# Patient Record
Sex: Female | Born: 1935
Health system: Southern US, Community
[De-identification: ages and names within clinical notes are randomized; demographics above are authoritative.]

## PROBLEM LIST (undated history)

## (undated) DIAGNOSIS — Z860100 Personal history of colon polyps, unspecified: Secondary | ICD-10-CM

## (undated) DIAGNOSIS — Z90710 Acquired absence of both cervix and uterus: Secondary | ICD-10-CM

## (undated) DIAGNOSIS — I4891 Unspecified atrial fibrillation: Secondary | ICD-10-CM

## (undated) DIAGNOSIS — Z8673 Personal history of transient ischemic attack (TIA), and cerebral infarction without residual deficits: Secondary | ICD-10-CM

## (undated) DIAGNOSIS — E785 Hyperlipidemia, unspecified: Secondary | ICD-10-CM

## (undated) DIAGNOSIS — I639 Cerebral infarction, unspecified: Secondary | ICD-10-CM

## (undated) DIAGNOSIS — I471 Supraventricular tachycardia: Secondary | ICD-10-CM

## (undated) DIAGNOSIS — M47812 Spondylosis without myelopathy or radiculopathy, cervical region: Secondary | ICD-10-CM

## (undated) DIAGNOSIS — M1610 Unilateral primary osteoarthritis, unspecified hip: Secondary | ICD-10-CM

## (undated) DIAGNOSIS — I10 Essential (primary) hypertension: Secondary | ICD-10-CM

## (undated) HISTORY — DX: Unspecified atrial fibrillation: I48.91

## (undated) HISTORY — DX: Personal history of colonic polyps: Z86.010

## (undated) HISTORY — DX: Personal history of colon polyps, unspecified: Z86.0100

## (undated) HISTORY — DX: Unilateral primary osteoarthritis, unspecified hip: M16.10

## (undated) HISTORY — PX: ABDOMINAL HYSTERECTOMY: SHX81

## (undated) HISTORY — DX: Essential (primary) hypertension: I10

## (undated) HISTORY — PX: OTHER SURGICAL HISTORY: SHX169

## (undated) HISTORY — DX: Personal history of transient ischemic attack (TIA), and cerebral infarction without residual deficits: Z86.73

## (undated) HISTORY — DX: Acquired absence of both cervix and uterus: Z90.710

## (undated) HISTORY — PX: BREAST LUMPECTOMY: SHX2

## (undated) HISTORY — PX: PILONIDAL CYST EXCISION: SHX744

## (undated) HISTORY — DX: Hyperlipidemia, unspecified: E78.5

## (undated) HISTORY — PX: INTRACAPSULAR CATARACT EXTRACTION: SHX361

## (undated) HISTORY — DX: Supraventricular tachycardia: I47.1

## (undated) HISTORY — DX: Spondylosis without myelopathy or radiculopathy, cervical region: M47.812

## (undated) SURGERY — Surgical Case
Anesthesia: *Unknown

---

## 1998-11-18 ENCOUNTER — Ambulatory Visit (HOSPITAL_COMMUNITY): Admission: RE | Admit: 1998-11-18 | Discharge: 1998-11-18 | Payer: Self-pay | Admitting: Family Medicine

## 2000-03-05 ENCOUNTER — Other Ambulatory Visit: Admission: RE | Admit: 2000-03-05 | Discharge: 2000-03-05 | Payer: Self-pay | Admitting: Family Medicine

## 2002-06-18 ENCOUNTER — Other Ambulatory Visit: Admission: RE | Admit: 2002-06-18 | Discharge: 2002-06-18 | Payer: Self-pay | Admitting: Radiology

## 2002-11-21 ENCOUNTER — Encounter: Payer: Self-pay | Admitting: Surgery

## 2002-11-24 ENCOUNTER — Encounter (INDEPENDENT_AMBULATORY_CARE_PROVIDER_SITE_OTHER): Payer: Self-pay

## 2002-11-24 ENCOUNTER — Ambulatory Visit (HOSPITAL_COMMUNITY): Admission: RE | Admit: 2002-11-24 | Discharge: 2002-11-24 | Payer: Self-pay | Admitting: Surgery

## 2004-10-26 ENCOUNTER — Ambulatory Visit: Payer: Self-pay | Admitting: Internal Medicine

## 2004-11-17 ENCOUNTER — Ambulatory Visit: Payer: Self-pay | Admitting: Internal Medicine

## 2004-11-24 ENCOUNTER — Ambulatory Visit: Payer: Self-pay | Admitting: Internal Medicine

## 2005-11-15 ENCOUNTER — Ambulatory Visit: Payer: Self-pay | Admitting: Internal Medicine

## 2005-11-20 ENCOUNTER — Ambulatory Visit: Payer: Self-pay | Admitting: Internal Medicine

## 2005-12-04 HISTORY — PX: TRANSTHORACIC ECHOCARDIOGRAM: SHX275

## 2006-09-26 ENCOUNTER — Ambulatory Visit: Payer: Self-pay | Admitting: Internal Medicine

## 2007-09-02 ENCOUNTER — Emergency Department (HOSPITAL_COMMUNITY): Admission: EM | Admit: 2007-09-02 | Discharge: 2007-09-03 | Payer: Self-pay | Admitting: Emergency Medicine

## 2007-09-24 ENCOUNTER — Encounter: Payer: Self-pay | Admitting: Internal Medicine

## 2007-09-24 ENCOUNTER — Ambulatory Visit: Payer: Self-pay | Admitting: Internal Medicine

## 2007-09-24 DIAGNOSIS — H53129 Transient visual loss, unspecified eye: Secondary | ICD-10-CM | POA: Insufficient documentation

## 2007-09-24 DIAGNOSIS — Z860100 Personal history of colon polyps, unspecified: Secondary | ICD-10-CM

## 2007-09-24 DIAGNOSIS — I10 Essential (primary) hypertension: Secondary | ICD-10-CM | POA: Insufficient documentation

## 2007-09-24 HISTORY — DX: Personal history of colonic polyps: Z86.010

## 2007-09-24 HISTORY — DX: Personal history of colon polyps, unspecified: Z86.0100

## 2007-09-25 ENCOUNTER — Ambulatory Visit: Payer: Self-pay

## 2007-09-25 ENCOUNTER — Encounter: Payer: Self-pay | Admitting: Internal Medicine

## 2007-10-10 ENCOUNTER — Telehealth: Payer: Self-pay | Admitting: Internal Medicine

## 2007-10-25 ENCOUNTER — Ambulatory Visit: Payer: Self-pay | Admitting: Internal Medicine

## 2007-10-25 ENCOUNTER — Telehealth: Payer: Self-pay | Admitting: Internal Medicine

## 2007-11-03 ENCOUNTER — Encounter: Payer: Self-pay | Admitting: Internal Medicine

## 2007-11-11 ENCOUNTER — Encounter: Admission: RE | Admit: 2007-11-11 | Discharge: 2007-11-11 | Payer: Self-pay | Admitting: Neurology

## 2007-11-11 ENCOUNTER — Encounter: Payer: Self-pay | Admitting: Internal Medicine

## 2007-11-13 ENCOUNTER — Telehealth: Payer: Self-pay | Admitting: Internal Medicine

## 2007-11-13 ENCOUNTER — Ambulatory Visit: Payer: Self-pay | Admitting: Internal Medicine

## 2007-11-13 ENCOUNTER — Encounter (INDEPENDENT_AMBULATORY_CARE_PROVIDER_SITE_OTHER): Payer: Self-pay | Admitting: *Deleted

## 2007-11-25 ENCOUNTER — Telehealth: Payer: Self-pay | Admitting: Internal Medicine

## 2007-12-17 ENCOUNTER — Ambulatory Visit: Payer: Self-pay | Admitting: Internal Medicine

## 2007-12-20 ENCOUNTER — Encounter: Payer: Self-pay | Admitting: Internal Medicine

## 2007-12-20 ENCOUNTER — Ambulatory Visit: Payer: Self-pay

## 2008-02-10 ENCOUNTER — Ambulatory Visit: Payer: Self-pay | Admitting: Internal Medicine

## 2008-03-30 ENCOUNTER — Encounter: Payer: Self-pay | Admitting: Internal Medicine

## 2008-03-30 LAB — HM COLONOSCOPY

## 2008-09-16 ENCOUNTER — Ambulatory Visit: Payer: Self-pay | Admitting: Internal Medicine

## 2008-09-25 ENCOUNTER — Ambulatory Visit: Payer: Self-pay | Admitting: Internal Medicine

## 2008-10-05 ENCOUNTER — Encounter: Payer: Self-pay | Admitting: Internal Medicine

## 2008-10-09 ENCOUNTER — Ambulatory Visit: Payer: Self-pay | Admitting: Internal Medicine

## 2008-10-09 DIAGNOSIS — E785 Hyperlipidemia, unspecified: Secondary | ICD-10-CM | POA: Insufficient documentation

## 2008-10-09 LAB — CONVERTED CEMR LAB
ALT: 24 units/L (ref 0–35)
AST: 25 units/L (ref 0–37)
Albumin: 4.2 g/dL (ref 3.5–5.2)
Alkaline Phosphatase: 45 units/L (ref 39–117)
BUN: 20 mg/dL (ref 6–23)
Basophils Absolute: 0 10*3/uL (ref 0.0–0.1)
Basophils Relative: 0.6 % (ref 0.0–3.0)
Bilirubin, Direct: 0.1 mg/dL (ref 0.0–0.3)
CO2: 28 meq/L (ref 19–32)
Calcium: 10.2 mg/dL (ref 8.4–10.5)
Chloride: 106 meq/L (ref 96–112)
Cholesterol: 149 mg/dL (ref 0–200)
Creatinine, Ser: 1.1 mg/dL (ref 0.4–1.2)
Eosinophils Absolute: 0.1 10*3/uL (ref 0.0–0.7)
Eosinophils Relative: 2.1 % (ref 0.0–5.0)
GFR calc Af Amer: 62 mL/min
GFR calc non Af Amer: 51 mL/min
Glucose, Bld: 102 mg/dL — ABNORMAL HIGH (ref 70–99)
HCT: 37.3 % (ref 36.0–46.0)
HDL: 85 mg/dL (ref >39.0)
Hemoglobin: 12.9 g/dL (ref 12.0–15.0)
LDL Cholesterol: 52 mg/dL (ref 0–99)
Lymphocytes Relative: 27.6 % (ref 12.0–46.0)
MCHC: 34.5 g/dL (ref 30.0–36.0)
MCV: 88.4 fL (ref 78.0–100.0)
Monocytes Absolute: 0.5 10*3/uL (ref 0.1–1.0)
Monocytes Relative: 9 % (ref 3.0–12.0)
Neutro Abs: 3.2 10*3/uL (ref 1.4–7.7)
Neutrophils Relative %: 60.7 % (ref 43.0–77.0)
Platelets: 174 10*3/uL (ref 150–400)
Potassium: 4 meq/L (ref 3.5–5.1)
RBC: 4.22 M/uL (ref 3.87–5.11)
RDW: 13.9 % (ref 11.5–14.6)
Sodium: 139 meq/L (ref 135–145)
TSH: 0.93 microintl units/mL (ref 0.35–5.50)
Total Bilirubin: 0.7 mg/dL (ref 0.3–1.2)
Total CHOL/HDL Ratio: 1.8
Total Protein: 6.8 g/dL (ref 6.0–8.3)
Triglycerides: 60 mg/dL (ref 0–149)
VLDL: 12 mg/dL (ref 0–40)
WBC: 5.3 10*3/uL (ref 4.5–10.5)

## 2008-10-11 ENCOUNTER — Encounter: Payer: Self-pay | Admitting: Internal Medicine

## 2009-01-05 ENCOUNTER — Telehealth: Payer: Self-pay | Admitting: Internal Medicine

## 2009-02-05 ENCOUNTER — Telehealth: Payer: Self-pay | Admitting: Internal Medicine

## 2009-02-11 ENCOUNTER — Ambulatory Visit: Payer: Self-pay | Admitting: Internal Medicine

## 2009-02-18 ENCOUNTER — Encounter: Payer: Self-pay | Admitting: Internal Medicine

## 2009-02-18 ENCOUNTER — Ambulatory Visit: Payer: Self-pay | Admitting: Internal Medicine

## 2009-09-27 ENCOUNTER — Ambulatory Visit: Payer: Self-pay | Admitting: Internal Medicine

## 2009-10-13 ENCOUNTER — Encounter: Payer: Self-pay | Admitting: Internal Medicine

## 2009-10-13 LAB — HM MAMMOGRAPHY: HM Mammogram: NORMAL

## 2009-11-09 ENCOUNTER — Ambulatory Visit: Payer: Self-pay | Admitting: Internal Medicine

## 2009-11-09 LAB — CONVERTED CEMR LAB
ALT: 22 units/L (ref 0–35)
AST: 26 units/L (ref 0–37)
Albumin: 4.4 g/dL (ref 3.5–5.2)
Alkaline Phosphatase: 43 units/L (ref 39–117)
BUN: 23 mg/dL (ref 6–23)
Basophils Absolute: 0.1 10*3/uL (ref 0.0–0.1)
Basophils Relative: 0.9 % (ref 0.0–3.0)
Bilirubin, Direct: 0.1 mg/dL (ref 0.0–0.3)
CO2: 28 meq/L (ref 19–32)
Calcium: 10.4 mg/dL (ref 8.4–10.5)
Chloride: 104 meq/L (ref 96–112)
Cholesterol: 172 mg/dL (ref 0–200)
Creatinine, Ser: 1.1 mg/dL (ref 0.4–1.2)
Eosinophils Absolute: 0.1 10*3/uL (ref 0.0–0.7)
Eosinophils Relative: 1.3 % (ref 0.0–5.0)
GFR calc non Af Amer: 50.85 mL/min (ref >60)
Glucose, Bld: 92 mg/dL (ref 70–99)
HCT: 39.7 % (ref 36.0–46.0)
HDL: 93.7 mg/dL (ref >39.00)
Hemoglobin: 13.3 g/dL (ref 12.0–15.0)
LDL Cholesterol: 67 mg/dL (ref 0–99)
Lymphocytes Relative: 24.8 % (ref 12.0–46.0)
Lymphs Abs: 1.5 10*3/uL (ref 0.7–4.0)
MCHC: 33.4 g/dL (ref 30.0–36.0)
MCV: 89.5 fL (ref 78.0–100.0)
Monocytes Absolute: 0.5 10*3/uL (ref 0.1–1.0)
Monocytes Relative: 7.4 % (ref 3.0–12.0)
Neutro Abs: 4 10*3/uL (ref 1.4–7.7)
Neutrophils Relative %: 65.6 % (ref 43.0–77.0)
Platelets: 178 10*3/uL (ref 150.0–400.0)
Potassium: 4.2 meq/L (ref 3.5–5.1)
RBC: 4.44 M/uL (ref 3.87–5.11)
RDW: 13.8 % (ref 11.5–14.6)
Sodium: 140 meq/L (ref 135–145)
TSH: 0.97 microintl units/mL (ref 0.35–5.50)
Total Bilirubin: 0.7 mg/dL (ref 0.3–1.2)
Total CHOL/HDL Ratio: 2
Total Protein: 7.4 g/dL (ref 6.0–8.3)
Triglycerides: 58 mg/dL (ref 0.0–149.0)
VLDL: 11.6 mg/dL (ref 0.0–40.0)
WBC: 6.2 10*3/uL (ref 4.5–10.5)

## 2009-11-14 ENCOUNTER — Encounter: Payer: Self-pay | Admitting: Internal Medicine

## 2010-02-01 DIAGNOSIS — I639 Cerebral infarction, unspecified: Secondary | ICD-10-CM

## 2010-02-01 HISTORY — DX: Cerebral infarction, unspecified: I63.9

## 2010-02-09 ENCOUNTER — Ambulatory Visit: Payer: Self-pay | Admitting: Internal Medicine

## 2010-02-09 DIAGNOSIS — R002 Palpitations: Secondary | ICD-10-CM | POA: Insufficient documentation

## 2010-10-18 ENCOUNTER — Ambulatory Visit: Payer: Self-pay | Admitting: Internal Medicine

## 2010-11-22 ENCOUNTER — Ambulatory Visit: Payer: Self-pay | Admitting: Internal Medicine

## 2010-11-22 LAB — CONVERTED CEMR LAB
ALT: 24 units/L (ref 0–35)
AST: 24 units/L (ref 0–37)
Albumin: 4.2 g/dL (ref 3.5–5.2)
Alkaline Phosphatase: 45 units/L (ref 39–117)
BUN: 27 mg/dL — ABNORMAL HIGH (ref 6–23)
Basophils Absolute: 0 10*3/uL (ref 0.0–0.1)
Basophils Relative: 0.7 % (ref 0.0–3.0)
Bilirubin, Direct: 0.1 mg/dL (ref 0.0–0.3)
CO2: 27 meq/L (ref 19–32)
Calcium: 10.9 mg/dL — ABNORMAL HIGH (ref 8.4–10.5)
Chloride: 101 meq/L (ref 96–112)
Cholesterol: 183 mg/dL (ref 0–200)
Creatinine, Ser: 1.2 mg/dL (ref 0.4–1.2)
Eosinophils Absolute: 0.1 10*3/uL (ref 0.0–0.7)
Eosinophils Relative: 1.4 % (ref 0.0–5.0)
GFR calc non Af Amer: 45.01 mL/min — ABNORMAL LOW (ref >60.00)
Glucose, Bld: 74 mg/dL (ref 70–99)
HCT: 41.4 % (ref 36.0–46.0)
HDL: 97 mg/dL (ref >39.00)
Hemoglobin: 14.1 g/dL (ref 12.0–15.0)
LDL Cholesterol: 70 mg/dL (ref 0–99)
Lymphocytes Relative: 34.6 % (ref 12.0–46.0)
Lymphs Abs: 2.5 10*3/uL (ref 0.7–4.0)
MCHC: 34 g/dL (ref 30.0–36.0)
MCV: 88.4 fL (ref 78.0–100.0)
Monocytes Absolute: 0.7 10*3/uL (ref 0.1–1.0)
Monocytes Relative: 9.4 % (ref 3.0–12.0)
Neutro Abs: 3.8 10*3/uL (ref 1.4–7.7)
Neutrophils Relative %: 53.9 % (ref 43.0–77.0)
Phosphorus: 3.2 mg/dL (ref 2.3–4.6)
Platelets: 233 10*3/uL (ref 150.0–400.0)
Potassium: 4.1 meq/L (ref 3.5–5.1)
RBC: 4.69 M/uL (ref 3.87–5.11)
RDW: 14.9 % — ABNORMAL HIGH (ref 11.5–14.6)
Sodium: 136 meq/L (ref 135–145)
TSH: 1.19 microintl units/mL (ref 0.35–5.50)
Total Bilirubin: 0.7 mg/dL (ref 0.3–1.2)
Total CHOL/HDL Ratio: 2
Total Protein: 7.2 g/dL (ref 6.0–8.3)
Triglycerides: 78 mg/dL (ref 0.0–149.0)
VLDL: 15.6 mg/dL (ref 0.0–40.0)
WBC: 7.1 10*3/uL (ref 4.5–10.5)

## 2010-12-14 ENCOUNTER — Telehealth: Payer: Self-pay | Admitting: Internal Medicine

## 2010-12-15 ENCOUNTER — Encounter: Payer: Self-pay | Admitting: Internal Medicine

## 2010-12-15 ENCOUNTER — Emergency Department (HOSPITAL_COMMUNITY)
Admission: EM | Admit: 2010-12-15 | Discharge: 2010-12-15 | Payer: Self-pay | Source: Home / Self Care | Admitting: Emergency Medicine

## 2010-12-19 LAB — BASIC METABOLIC PANEL
BUN: 20 mg/dL (ref 6–23)
CO2: 24 mEq/L (ref 19–32)
Calcium: 10.4 mg/dL (ref 8.4–10.5)
Chloride: 106 mEq/L (ref 96–112)
Creatinine, Ser: 1.24 mg/dL — ABNORMAL HIGH (ref 0.4–1.2)
GFR calc Af Amer: 50 mL/min — ABNORMAL LOW (ref >60)
GFR calc non Af Amer: 42 mL/min — ABNORMAL LOW (ref >60)
Glucose, Bld: 111 mg/dL — ABNORMAL HIGH (ref 70–99)
Potassium: 3.9 mEq/L (ref 3.5–5.1)
Sodium: 139 mEq/L (ref 135–145)

## 2010-12-19 LAB — POCT CARDIAC MARKERS
CKMB, poc: 1.2 ng/mL (ref 1.0–8.0)
Myoglobin, poc: 84.6 ng/mL (ref 12–200)
Troponin i, poc: <0.05 ng/mL (ref 0.00–0.09)

## 2010-12-19 LAB — URINALYSIS, ROUTINE W REFLEX MICROSCOPIC
Bilirubin Urine: NEGATIVE
Hgb urine dipstick: NEGATIVE
Ketones, ur: NEGATIVE mg/dL
Nitrite: NEGATIVE
Protein, ur: NEGATIVE mg/dL
Specific Gravity, Urine: 1.009 (ref 1.005–1.030)
Urine Glucose, Fasting: NEGATIVE mg/dL
Urobilinogen, UA: 0.2 mg/dL (ref 0.0–1.0)
pH: 6 (ref 5.0–8.0)

## 2010-12-19 LAB — CBC
HCT: 39.9 % (ref 36.0–46.0)
Hemoglobin: 13.8 g/dL (ref 12.0–15.0)
MCH: 29.6 pg (ref 26.0–34.0)
MCHC: 34.6 g/dL (ref 30.0–36.0)
MCV: 85.6 fL (ref 78.0–100.0)
Platelets: 188 10*3/uL (ref 150–400)
RBC: 4.66 MIL/uL (ref 3.87–5.11)
RDW: 14.4 % (ref 11.5–15.5)
WBC: 7.4 10*3/uL (ref 4.0–10.5)

## 2010-12-19 LAB — DIFFERENTIAL
Basophils Absolute: 0 10*3/uL (ref 0.0–0.1)
Basophils Relative: 1 % (ref 0–1)
Eosinophils Absolute: 0.1 10*3/uL (ref 0.0–0.7)
Eosinophils Relative: 1 % (ref 0–5)
Lymphocytes Relative: 32 % (ref 12–46)
Lymphs Abs: 2.4 10*3/uL (ref 0.7–4.0)
Monocytes Absolute: 0.6 10*3/uL (ref 0.1–1.0)
Monocytes Relative: 8 % (ref 3–12)
Neutro Abs: 4.3 10*3/uL (ref 1.7–7.7)
Neutrophils Relative %: 59 % (ref 43–77)

## 2010-12-20 ENCOUNTER — Ambulatory Visit
Admission: RE | Admit: 2010-12-20 | Discharge: 2010-12-20 | Payer: Self-pay | Source: Home / Self Care | Attending: Internal Medicine | Admitting: Internal Medicine

## 2010-12-20 ENCOUNTER — Encounter: Payer: Self-pay | Admitting: Internal Medicine

## 2010-12-20 DIAGNOSIS — I471 Supraventricular tachycardia, unspecified: Secondary | ICD-10-CM | POA: Insufficient documentation

## 2010-12-20 HISTORY — DX: Supraventricular tachycardia: I47.1

## 2010-12-20 HISTORY — DX: Supraventricular tachycardia, unspecified: I47.10

## 2011-01-04 ENCOUNTER — Encounter: Payer: Self-pay | Admitting: Internal Medicine

## 2011-01-04 ENCOUNTER — Ambulatory Visit: Admit: 2011-01-04 | Payer: Self-pay

## 2011-01-04 ENCOUNTER — Encounter (INDEPENDENT_AMBULATORY_CARE_PROVIDER_SITE_OTHER): Payer: MEDICARE | Admitting: Internal Medicine

## 2011-01-04 ENCOUNTER — Ambulatory Visit: Admit: 2011-01-04 | Payer: Self-pay | Admitting: Internal Medicine

## 2011-01-04 ENCOUNTER — Encounter (INDEPENDENT_AMBULATORY_CARE_PROVIDER_SITE_OTHER): Payer: MEDICARE

## 2011-01-04 DIAGNOSIS — I471 Supraventricular tachycardia: Secondary | ICD-10-CM

## 2011-01-04 DIAGNOSIS — R0989 Other specified symptoms and signs involving the circulatory and respiratory systems: Secondary | ICD-10-CM

## 2011-01-05 NOTE — Consult Note (Signed)
Summary: [facility]  [facility]   Imported By: Esmeralda Links D'jimraou 04/03/2008 14:55:28  _____________________________________________________________________  External Attachment:    Type:   Image     Comment:   External Document

## 2011-01-05 NOTE — Assessment & Plan Note (Signed)
Summary: YEARLY--STC   Vital Signs:  Patient profile:   77 year old female Height:      62 inches Weight:      150 pounds BMI:     27.53 O2 Sat:      98 % on Room air Temp:     97.8 degrees F oral Pulse rate:   61 / minute BP sitting:   164 / 70  (left arm) Cuff size:   regular  Vitals Entered By: Bill Salinas CMA (November 22, 2010 10:12 AM)  O2 Flow:  Room air CC: yearly  Vision Screening:      Vision Comments: cateract surg on both eyes in the past year   Primary Care :  [facility]  CC:  yearly.  History of Present Illness: Patient presents for medicare wellness exam. She is feeling OK . She does have pain in the jaw intermittently, once a month, that is not exercise related. No associated symptoms.   She has not had any synocpal episodes. She has been released by Dr. Ladona Ridgel.   100% independent in her ADLs. No evidence of depression. She did have a fall on an icy deck. She is not fall prone and has no increased risks. She manges all her own finances: pays the bills, balances her checkbook, remains active and is cognitively intact.   Preventive Screening-Counseling & Management  Alcohol-Tobacco     Alcohol drinks/day: <1     Alcohol type: wine     Smoking Status: never  Caffeine-Diet-Exercise     Caffeine use/day: 2 cups per day     Diet Comments: heart healthy, low carb     Diet Counseling: not indicated; diet is assessed to be healthy     Does Patient Exercise: no     Exercise Counseling: to improve exercise regimen  Hep-HIV-STD-Contraception     Hepatitis Risk: no risk noted     HIV Risk: no risk noted     STD Risk: no risk noted     Dental Visit-last 6 months yes     SBE monthly: yes     Sun Exposure-Excessive: no  Safety-Violence-Falls     Seat Belt Use: yes     Helmet Use: n/a     Firearms in the Home: no firearms in the home     Smoke Detectors: yes     Violence in the Home: no risk noted     Sexual Abuse: no     Fall Risk: slight fall  risk      Drug Use:  never.        Blood Transfusions:  no.    Current Medications (verified): 1)  Diovan Hct 160-12.5 Mg  Tabs (Valsartan-Hydrochlorothiazide) .... Once Daily 2)  Aspirin 325 Mg  Tabs (Aspirin) .... Take 1 Tablet By Mouth Two Times A Day 3)  Nitroglycerin 0.4 Mg Subl (Nitroglycerin) .... Take 1 Tab Q 5 Min Sl As Needed Chest Pain 4)  Simvastatin 10 Mg Tabs (Simvastatin) .... Take 1 Tab By Mouth At Bedtime 5)  Verapamil Hcl Cr 180 Mg Cr-Tabs (Verapamil Hcl) .... Take 1 Tablet By Mouth Once A Day 6)  Tums 500 Mg Chew (Calcium Carbonate Antacid) .... As Directed 7)  Folbic 2.5-25-2 Mg Tabs (Fa-Pyridoxine-Cyancobalamin) .... One By Mouth Daily 8)  Fish Oil 1000 Mg Caps (Omega-3 Fatty Acids) .Marland Kitchen.. 1 Three Times A Day  Allergies (verified): 1)  ! Codeine  Past History:  Past Medical History: Last updated: 02/17/2009 UCD arthritis-cervical and hips G5P6-  1 twinning Colonic polyps, hx of Hypertension supraventricular tachycardia  Past Surgical History: pilonidal cyst excision Hysterectomy-fibroid tumors Lumpectomy-left FB removal from popliteal fossae Cateract surg on Left eye Oct 18, 2009  right eye Jan '11 By Dr Doloris Hall  Family History: mother- deceased @81  - CVA, CAD/w MI,  father- colon cancer. Died at 43 no breast cancer, DM-in daughter  Social History: Reviewed history from 11/09/2009 and no changes required. married - 1951- widowed 04/29/2023 4-daughters, 1 died leukemia 2 sons 10 grandchildren 5 great-grandchildren I-ADLs Retired  Tobacco Use - No.  Alcohol Use - no Caffeine use/day:  2 cups per day Dental Care w/in 6 mos.:  yes Sun Exposure-Excessive:  no Seat Belt Use:  yes Fall Risk:  slight fall risk Blood Transfusions:  no Hepatitis Risk:  no risk noted HIV Risk:  no risk noted STD Risk:  no risk noted Drug Use:  never  Review of Systems       The patient complains of peripheral edema.  The patient denies anorexia, fever, weight  loss, weight gain, vision loss, decreased hearing, hoarseness, chest pain, syncope, dyspnea on exertion, prolonged cough, abdominal pain, severe indigestion/heartburn, incontinence, muscle weakness, difficulty walking, depression, abnormal bleeding, enlarged lymph nodes, angioedema, and breast masses.    Physical Exam  General:  Well-developed,well-nourished,in no acute distress; alert,appropriate and cooperative throughout examination Head:  Normocephalic and atraumatic without obvious abnormalities. No apparent alopecia or balding. Eyes:  No corneal or conjunctival inflammation noted. EOMI. Perrla. Funduscopic exam benign, without hemorrhages, exudates or papilledema. Vision grossly normal. Ears:  External ear exam shows no significant lesions or deformities.  Otoscopic examination reveals clear canals, tympanic membranes are intact bilaterally without bulging, retraction, inflammation or discharge. Hearing is grossly normal bilaterally. Nose:  no external deformity and no external erythema.   Mouth:  Oral mucosa and oropharynx without lesions or exudates.  Teeth in good repair. Neck:  supple, full ROM, no thyromegaly, and no carotid bruits.   Chest Wall:  No deformities, masses, or tenderness noted. Breasts:  No mass, nodules, thickening, tenderness, bulging, retraction, inflamation, nipple discharge or skin changes noted.   Lungs:  Normal respiratory effort, chest expands symmetrically. Lungs are clear to auscultation, no crackles or wheezes. Heart:  Normal rate and regular rhythm. S1 and S2 normal without gallop, murmur, click, rub or other extra sounds. Abdomen:  soft, non-tender, normal bowel sounds, no masses, no guarding, and no hepatomegaly.   Genitalia:  deferred Msk:  normal ROM, no joint tenderness, no joint swelling, no joint warmth, and no joint instability.   Pulses:  2+ radial and DP pulses Extremities:  No clubbing, cyanosis, edema, or deformity noted with normal full range of  motion of all joints.   Neurologic:  alert & oriented X3, cranial nerves II-XII intact, strength normal in all extremities, sensation intact to pinprick, gait normal, and DTRs symmetrical and normal.     Impression & Recommendations:  Problem # 1:  PALPITATIONS, OCCASIONAL (ICD-785.1) Very stable. She has been released by Dr. Ladona Ridgel.  Problem # 2:  HYPERLIPIDEMIA (ICD-272.4) Due for lab with recommendations to follow.  Her updated medication list for this problem includes:    Simvastatin 10 Mg Tabs (Simvastatin) .Marland Kitchen... Take 1 tab by mouth at bedtime  Orders: TLB-Lipid Panel (80061-LIPID) TLB-Hepatic/Liver Function Pnl (80076-HEPATIC) TLB-TSH (Thyroid Stimulating Hormone) (84443-TSH)  Addendum - LDL at goal. Liver functions normal  Problem # 3:  HYPERTENSION (ICD-401.9)  Her updated medication list for this problem includes:    Diovan  Hct 160-12.5 Mg Tabs (Valsartan-hydrochlorothiazide) ..... Once daily    Verapamil Hcl Cr 180 Mg Cr-tabs (Verapamil hcl) .Marland Kitchen... Take 1 tablet by mouth once a day  Orders: TLB-Renal Function Panel (80069-RENAL)  BP today: 164/70 Prior BP: 145/71 (02/09/2010)  Suboptimal control today. she has not been monitoring her BP at home. She has a hisotry of better control.  Plan - home monitoring with report back if the SBP is consistently 140 or higher.   Problem # 4:  Preventive Health Care (ICD-V70.0)  Interval history is unremarkable. Her exam is normal. Lab results are within normal limits. She is current with mammography with last study Nov '11 with recommendations for study every 2 years. She is current with colorectal cancer screening with last study April '09. Immunizations: Tetnus Dec '10; Shingles vaccine March '10; flu Nov '11.   In summary- a very pleasant woman who is medically stable and doing well. She is advised to develop a regular exercise program with aerobic training (walking) 30 minutes 3 times a week and flex/stretch exercise 2-3  times a week for greater mobility and reduction of fall risk.  She will return as needed or in 1 year.   Orders: Medicare -1st Annual Wellness Visit [phone])  Complete Medication List: 1)  Diovan Hct 160-12.5 Mg Tabs (Valsartan-hydrochlorothiazide) .... Once daily 2)  Aspirin 325 Mg Tabs (Aspirin) .... Take 1 tablet by mouth two times a day 3)  Nitroglycerin 0.4 Mg Subl (Nitroglycerin) .... Take 1 tab q 5 min sl as needed chest pain 4)  Simvastatin 10 Mg Tabs (Simvastatin) .... Take 1 tab by mouth at bedtime 5)  Verapamil Hcl Cr 180 Mg Cr-tabs (Verapamil hcl) .... Take 1 tablet by mouth once a day 6)  Tums 500 Mg Chew (Calcium carbonate antacid) .... As directed 7)  Folbic 2.5-25-2 Mg Tabs (Fa-pyridoxine-cyancobalamin) .... One by mouth daily 8)  Fish Oil 1000 Mg Caps (Omega-3 fatty acids) .Marland Kitchen.. 1 three times a day  Other Orders: TLB-CBC Platelet - w/Differential (85025-CBCD)  Patient: [name] Note: All result statuses are Final unless otherwise noted.  Tests: (1) Lipid Panel (LIPID)   Cholesterol               183 mg/dL                   6-578     ATP III Classification            Desirable:  < 200 mg/dL                    Borderline High:  200 - 239 mg/dL               High:  > = 240 mg/dL   Triglycerides             78.0 mg/dL                  4.6-962.9     Normal:  <150 mg/dL     Borderline High:  528 - 199 mg/dL   HDL                       41.32 mg/dL                 >44.01   VLDL Cholesterol          15.6 mg/dL  0.0-40.0   LDL Cholesterol           70 mg/dL                    1-61  CHO/HDL Ratio:  CHD Risk                             2                    Men          Women     1/2 Average Risk     3.4          3.3     Average Risk          5.0          4.4     2X Average Risk          9.6          7.1     3X Average Risk          15.0          11.0                           Tests: (2) Hepatic/Liver Function Panel (HEPATIC)   Total Bilirubin            0.7 mg/dL                   0.9-6.0   Direct Bilirubin          0.1 mg/dL                   4.5-4.0   Alkaline Phosphatase      45 U/L                      39-117   AST                       24 U/L                      0-37   ALT                       24 U/L                      0-35   Total Protein             7.2 g/dL                    9.8-1.1   Albumin                   4.2 g/dL                    9.1-4.7  Tests: (3) Renal Function Panel (RENAL)   Sodium                    136 mEq/L                   135-145   Potassium                 4.1 mEq/L  3.5-5.1   Chloride                  101 mEq/L                   96-112   Carbon Dioxide            27 mEq/L                    19-32   Calcium              [H]  10.9 mg/dL                  1.1-91.4   Albumin                   4.2 g/dL                    7.8-2.9   BUN                  [H]  27 mg/dL                    5-62   Creatinine                1.2 mg/dL                   1.3-0.8   Glucose                   74 mg/dL                    65-78   Phosphorus                3.2 mg/dL                   4.6-9.6   GFR                  [L]  45.01 mL/min                >60.00  Tests: (4) CBC Platelet w/Diff (CBCD)   White Cell Count          7.1 K/uL                    4.5-10.5   Red Cell Count            4.69 Mil/uL                 3.87-5.11   Hemoglobin                14.1 g/dL                   29.5-28.4   Hematocrit                41.4 %                      36.0-46.0   MCV                       88.4 fl                     78.0-100.0   MCHC                      34.0 g/dL  30.0-36.0   RDW                  [H]  14.9 %                      11.5-14.6   Platelet Count            233.0 K/uL                  150.0-400.0   Neutrophil %              53.9 %                      43.0-77.0   Lymphocyte %              34.6 %                      12.0-46.0   Monocyte %                9.4 %                        3.0-12.0   Eosinophils%              1.4 %                       0.0-5.0   Basophils %               0.7 %                       0.0-3.0   Neutrophill Absolute      3.8 K/uL                    1.4-7.7   Lymphocyte Absolute       2.5 K/uL                    0.7-4.0   Monocyte Absolute         0.7 K/uL                    0.1-1.0  Eosinophils, Absolute                             0.1 K/uL                    0.0-0.7   Basophils Absolute        0.0 K/uL                    0.0-0.1  Tests: (5) TSH (TSH)   FastTSH                   1.19 uIU/mL                 0.35-5.50  Orders Added: 1)  TLB-Lipid Panel [80061-LIPID] 2)  TLB-Hepatic/Liver Function Pnl [80076-HEPATIC] 3)  TLB-Renal Function Panel [80069-RENAL] 4)  TLB-CBC Platelet - w/Differential [85025-CBCD] 5)  TLB-TSH (Thyroid Stimulating Hormone) [84443-TSH] 6)  Medicare -1st Annual Wellness Visit [G0438] 7)  Est. Patient Level III [14782]

## 2011-01-05 NOTE — Progress Notes (Signed)
Summary: pt havingf SVT feeling funny and weak   Phone Note Call from Patient Call back at [phone]   Caller: Daughter/ patty Summary of Call: Pt having SVT and feeling weak and funny with jaw pain  12-15-2010 Addendum:Pt dtr called because pt was having irreg HR and was symptomatic. Advised dtr to call 911 and come to ER but dtr stated she would bring her by car.   Theodore Demark, PA-C Initial call taken by: Judie Grieve,  December 14, 2010 4:17 PM  Follow-up for Phone Call        patty-discussed with her and will talk with DrTaylor and call pt back tomorrow Dennis Bast, RN, BSN  December 14, 2010 5:03 PM discussed with Dr Ladona Ridgel  he feels like if pt is having this daily it would benefit [place] for her to wear a monitor( 48hour) Heart beats fast it starts with lower jaw hurting first and it last for about 15-20 min  it just goes away on its on denies SOB, weakness,nausea sweating,  Another spell is like a feels like a shock feel her body MRI of head--negative.  Just had a physical Dr Arthur Holms   Additional Follow-up for Phone Call Additional follow up Details #1::        pt had SVT on Thurs night at 180 went to ER saw Dr Gala Romney  Increased her Verapamil to 240mg  and see Dr Ladona Ridgel on Tues 12/20/10 at 1:45 Pt aware Dennis Bast, RN, BSN  December 19, 2010 8:35 AM

## 2011-01-05 NOTE — Assessment & Plan Note (Signed)
Summary: ADD ON PER [facility]  Medications Added CARTIA XT 240 MG XR24H-CAP (DILTIAZEM HCL COATED BEADS) once daily FLECAINIDE ACETATE 50 MG TABS (FLECAINIDE ACETATE) one by mouth two times a day        Visit Type:  Follow-up Primary Provider:  [facility]   History of Present Illness: Mrs. Even returns today for followup of her atrial arrhythmias.  She was in the hospital several days ago with palpitations and was thought to have SVT.  Her only strip demonstrates what appears to be NSR with frequent and consecutive PAc's.  She had her verapamil switched to cardizem and she returns today for followup.  She has not had syncope or sob or peripheral edema.  Current Medications (verified): 1)  Diovan Hct 160-12.5 Mg  Tabs (Valsartan-Hydrochlorothiazide) .... Once Daily 2)  Aspirin 325 Mg  Tabs (Aspirin) .... Take 1 Tablet By Mouth Two Times A Day 3)  Nitroglycerin 0.4 Mg Subl (Nitroglycerin) .... Take 1 Tab Q 5 Min Sl As Needed Chest Pain 4)  Simvastatin 10 Mg Tabs (Simvastatin) .... Take 1 Tab By Mouth At Bedtime 5)  Verapamil Hcl Cr 180 Mg Cr-Tabs (Verapamil Hcl) .... As Needed 6)  Tums 500 Mg Chew (Calcium Carbonate Antacid) .... As Directed 7)  Folbic 2.5-25-2 Mg Tabs (Fa-Pyridoxine-Cyancobalamin) .... One By Mouth Daily 8)  Fish Oil 1000 Mg Caps (Omega-3 Fatty Acids) .Marland Kitchen.. 1 Three Times A Day 9)  Cartia Xt 240 Mg Xr24h-Cap (Diltiazem Hcl Coated Beads) .... Once Daily  Allergies: 1)  ! Codeine  Past History:  Past Medical History: Last updated: 02/17/2009 UCD arthritis-cervical and hips G5P6- 1 twinning Colonic polyps, hx of Hypertension supraventricular tachycardia  Past Surgical History: Last updated: 11/22/2010 pilonidal cyst excision Hysterectomy-fibroid tumors Lumpectomy-left FB removal from popliteal fossae Cateract surg on Left eye Oct 18, 2009  right eye Jan '11 By Dr Doloris Hall  Review of Systems  The patient denies chest pain, syncope, dyspnea on  exertion, and peripheral edema.    Vital Signs:  Patient profile:   77 year old female Height:      62 inches Weight:      149 pounds BMI:     27.35 Pulse rate:   57 / minute BP sitting:   144 / 72  (left arm)  Vitals Entered By: Laurance Flatten CMA (December 20, 2010 1:55 PM)  Physical Exam  General:  Well-developed,well-nourished,in no acute distress; alert,appropriate and cooperative throughout examination Head:  Normocephalic and atraumatic without obvious abnormalities. No apparent alopecia or balding. Eyes:  No corneal or conjunctival inflammation noted. EOMI. Perrla. Funduscopic exam benign, without hemorrhages, exudates or papilledema. Vision grossly normal. Neck:  supple, full ROM, no thyromegaly, and no carotid bruits.   Lungs:  Normal respiratory effort, chest expands symmetrically. Lungs are clear to auscultation, no crackles or wheezes. Heart:  Normal rate and regular rhythm. S1 and S2 normal without gallop, murmur, click, rub or other extra sounds. Abdomen:  soft, non-tender, normal bowel sounds, no masses, no guarding, and no hepatomegaly.   Msk:  normal ROM, no joint tenderness, no joint swelling, no joint warmth, and no joint instability.   Pulses:  2+ radial and DP pulses Extremities:  No clubbing, cyanosis, edema, or deformity noted with normal full range of motion of all joints.   Neurologic:  alert & oriented X3, cranial nerves II-XII intact, strength normal in all extremities, sensation intact to pinprick, gait normal, and DTRs symmetrical and normal.     EKG  Procedure  date:  12/20/2010  Findings:      Normal sinus rhythm with rate of:  57.Left axis deviation.    Impression & Recommendations:  Problem # 1:  PALPITATIONS, OCCASIONAL (ICD-785.1) Her symptoms have worsened and I suspect she has mutliple atrial arrhythmias.  I have recommended that she start low dose flecainide and stop her verapamil.  Will schedule an exercise test. She will continue her  [place]. The following medications were removed from the medication list:    Verapamil Hcl Cr 180 Mg Cr-tabs (Verapamil hcl) .Marland Kitchen... Take 1 tablet by mouth once a day Her updated medication list for this problem includes:    Aspirin 325 Mg Tabs (Aspirin) .Marland Kitchen... Take 1 tablet by mouth two times a day    Nitroglycerin 0.4 Mg Subl (Nitroglycerin) .Marland Kitchen... Take 1 tab q 5 min sl as needed chest pain    Cartia Xt 240 Mg Xr24h-cap (Diltiazem hcl coated beads) ..... Once daily    Flecainide Acetate 50 Mg Tabs (Flecainide acetate) ..... One by mouth two times a day  Problem # 2:  HYPERTENSION (ICD-401.9) Her blood pressure is reasonably well controlled. will folow. The following medications were removed from the medication list:    Verapamil Hcl Cr 180 Mg Cr-tabs (Verapamil hcl) .Marland Kitchen... Take 1 tablet by mouth once a day Her updated medication list for this problem includes:    Diovan Hct 160-12.5 Mg Tabs (Valsartan-hydrochlorothiazide) ..... Once daily    Aspirin 325 Mg Tabs (Aspirin) .Marland Kitchen... Take 1 tablet by mouth two times a day    Cartia Xt 240 Mg Xr24h-cap (Diltiazem hcl coated beads) ..... Once daily  Problem # 3:  HYPERLIPIDEMIA (ICD-272.4) She will continue a low fat diet. Her updated medication list for this problem includes:    Simvastatin 10 Mg Tabs (Simvastatin) .Marland Kitchen... Take 1 tab by mouth at bedtime  Other Orders: Treadmill (Treadmill)  Patient Instructions: 1)  Your physician recommends that you schedule a follow-up appointment in: with treadmill in 2weeks with Dr Ladona Ridgel 2)  Your physician has recommended you make the following change in your medication: stop Verapamil and start Flecainid 50mg  two times a day 3)  May also take an additioanl 50mg  Flecainide if needed Prescriptions: FLECAINIDE ACETATE 50 MG TABS (FLECAINIDE ACETATE) one by mouth two times a day  #60 x 6   Entered by:   Dennis Bast, RN, BSN   Authorized by:   Laren Boom, MD, [facility]   Signed by:   Dennis Bast, RN, BSN  on 12/20/2010   Method used:   Electronically to        [facility] [id number]* (retail)       [address] [place], [place]  [address]       Ph: [phone]       Fax: [phone]   RxID:   [id number]

## 2011-01-05 NOTE — Assessment & Plan Note (Signed)
   Allergies: 1)  ! Codeine  Vital Signs:  Patient profile:   77 year old female Height:      62 inches Weight:      152 pounds BMI:     27.90 Pulse rate:   65 / minute Resp:     18 per minute BP sitting:   148 / 76  (left arm)  Vitals Entered By: Marrion Coy, CNA (February 18, 2009 10:59 AM)

## 2011-01-05 NOTE — Assessment & Plan Note (Signed)
Summary: FLU VAC  -OK PER LOU  -MEN  STC   Nurse Visit   Allergies: 1)  ! Codeine  Orders Added: 1)  Flu Vaccine 32yrs + MEDICARE PATIENTS [Q2039] 2)  Administration Flu vaccine - MCR [G0008]      Flu Vaccine Consent Questions     Do you have a history of severe allergic reactions to this vaccine? no    Any prior history of allergic reactions to egg and/or gelatin? no    Do you have a sensitivity to the preservative Thimersol? no    Do you have a past history of Guillan-Barre Syndrome? no    Do you currently have an acute febrile illness? no    Have you ever had a severe reaction to latex? no    Vaccine information given and explained to patient? yes    Are you currently pregnant? no    Lot Number:[id number]   Exp Date:06/03/2011   Site Given  Left Deltoid IM1

## 2011-01-05 NOTE — Letter (Signed)
   [facility] [address] [place], [place]  [address] Phone: [phone]      October 05, 2008   TINSLEE KLARE [address] [place], [place] [address]  RE:  LAB RESULTS  Dear  Ms. Thetford,  The following is an interpretation of your most recent lab tests.  Please take note of any instructions provided or changes to medications that have resulted from your lab work.  Carotid doppler study from October 23rd revealed very slight plaque build-up not considered significant. Cardiology recommneds follow-up study in 1 year.    Call or e-mail me if you have questions [email or web address]).   Sincerely Yours,    Jacques Navy MD

## 2011-01-05 NOTE — Assessment & Plan Note (Signed)
Summary: 1 YR F/U      Allergies Added:   Visit Type:  Follow-up Primary Provider:  Norins  CC:  no complaints.  History of Present Illness: [name] returns today for followup.  I saw her over a year ago for palpitations and documented not brief episodes of SVT.  At that time, I thought her spells were not particularly severe and we recommended verapamil, which she took and has tolerated very well.  Since then, she has done well with no significant tachypalpitations.  She has recently  returned from [place] where she visited her sister. No other complaints.    Current Medications (verified): 1)  Diovan Hct 160-12.5 Mg  Tabs (Valsartan-Hydrochlorothiazide) .... Once Daily 2)  Aspirin 325 Mg  Tabs (Aspirin) .... Take 1 Tablet By Mouth Two Times A Day 3)  Nitroglycerin 0.4 Mg Subl (Nitroglycerin) .... Take 1 Tab Q 5 Min Sl As Needed Chest Pain 4)  Simvastatin 10 Mg Tabs (Simvastatin) .... Take 1 Tab By Mouth At Bedtime 5)  Verapamil Hcl Cr 180 Mg Cr-Tabs (Verapamil Hcl) .... Take 1 Tablet By Mouth Once A Day 6)  Tums 500 Mg Chew (Calcium Carbonate Antacid) .... As Directed 7)  Folbic 2.5-25-2 Mg Tabs (Fa-Pyridoxine-Cyancobalamin) .... One By Mouth Daily  Allergies (verified): 1)  ! Codeine  Past History:  Past Medical History: Last updated: 02/17/2009 UCD arthritis-cervical and hips G5P6- 1 twinning Colonic polyps, hx of Hypertension supraventricular tachycardia  Past Surgical History: Last updated: 11/09/2009 pilonidal cyst excision Hysterectomy-fibroid tumors Lumpectomy-left FB removal from popliteal fossae Cateract surg on Left eye Oct 18, 2009 By Dr Doloris Hall  Review of Systems  The patient denies chest pain, syncope, dyspnea on exertion, and peripheral edema.    Vital Signs:  Patient profile:   77 year old female Height:      62 inches Weight:      154 pounds BMI:     28.27 Pulse rate:   63 / minute BP sitting:   145 / 71  (left arm) Cuff size:    large  Vitals Entered By: Burnett Kanaris, CNA (February 09, 2010 9:43 AM)  Physical Exam  General:  Well-developed,well-nourished,in no acute distress; alert,appropriate and cooperative throughout examination Head:  normocephalic, atraumatic, and no abnormalities observed.   Eyes:  vision grossly intact, pupils equal, corneas and lenses clear, and no injection.   Mouth:  good dentition, no dental plaque, pharynx pink and moist, no erythema, and no posterior lymphoid hypertrophy.   Neck:  full ROM, no thyromegaly, and no carotid bruits.   Lungs:  Normal respiratory effort, chest expands symmetrically. Lungs are clear to auscultation, no crackles or wheezes. Heart:  Normal rate and regular rhythm. S1 and S2 normal without gallop, murmur, click, rub or other extra sounds. Abdomen:  soft, non-tender, normal bowel sounds, no distention, no rigidity, and no hepatomegaly.   Msk:  normal ROM, no joint tenderness, no joint swelling, no joint warmth, and no joint deformities.   Pulses:  2+ radial and DP pulses Extremities:  No clubbing, cyanosis, edema, or deformity noted with normal full range of motion of all joints.   Neurologic:  alert & oriented X3, cranial nerves II-XII intact, strength normal in all extremities, gait normal, and DTRs symmetrical and normal.     EKG  Procedure date:  02/09/2010  Findings:      Normal sinus rhythm with rate of: 62.   Impression & Recommendations:  Problem # 1:  PALPITATIONS, OCCASIONAL (ICD-785.1) Her  symptoms are now well controlled.  I have asked her to continue her current meds and call me if her symptoms worsen. Her updated medication list for this problem includes:    Aspirin 325 Mg Tabs (Aspirin) .Marland Kitchen... Take 1 tablet by mouth two times a day    Nitroglycerin 0.4 Mg Subl (Nitroglycerin) .Marland Kitchen... Take 1 tab q 5 min sl as needed chest pain    Verapamil Hcl Cr 180 Mg Cr-tabs (Verapamil hcl) .Marland Kitchen... Take 1 tablet by mouth once a day  Problem # 2:  HYPERTENSION  (ICD-401.9) Her blood pressure is fairly well controlled.  A low sodium diet is warranted. Her updated medication list for this problem includes:    Diovan Hct 160-12.5 Mg Tabs (Valsartan-hydrochlorothiazide) ..... Once daily    Aspirin 325 Mg Tabs (Aspirin) .Marland Kitchen... Take 1 tablet by mouth two times a day    Verapamil Hcl Cr 180 Mg Cr-tabs (Verapamil hcl) .Marland Kitchen... Take 1 tablet by mouth once a day  Patient Instructions: 1)  Your physician recommends that you schedule a follow-up appointment as needed

## 2011-02-08 ENCOUNTER — Telehealth: Payer: Self-pay | Admitting: Internal Medicine

## 2011-02-14 NOTE — Progress Notes (Signed)
Summary: pt had stroke and needs information/FAX NUMBER   Phone Note Call from Patient Call back at [phone]   Caller: Daughter/Patty Reason for Call: Talk to Nurse, Talk to Doctor Summary of Call: pt is in [place] and pt had a stroke and they need information like med list and what she was in last so they can proceed with care Initial call taken by: Omer Jack,  February 08, 2011 8:40 AM  Follow-up for Phone Call        lmom for daughter to call me back in regards to her mom  Just give me a fax # and I will be glad to send the records Dennis Bast, RN, BSN  February 08, 2011 9:08 AM  Additional Follow-up for Phone Call Additional follow up Details #1::        fax# [phone]. please also send pt last ekg as well. to attn carmen Additional Follow-up by: Roe Coombs,  February 08, 2011 9:21 AM    Additional Follow-up for Phone Call Additional follow up Details #2::    Selena Batten in Medical records given info to fax Dennis Bast, RN, BSN  February 08, 2011 9:41 AM

## 2011-02-17 ENCOUNTER — Inpatient Hospital Stay (HOSPITAL_COMMUNITY)
Admission: RE | Admit: 2011-02-17 | Discharge: 2011-02-28 | DRG: 945 | Disposition: A | Discharge status: Home / Self Care | Payer: Medicare Other | Source: Ambulatory Visit | Attending: Physical Medicine & Rehabilitation | Admitting: Physical Medicine & Rehabilitation

## 2011-02-17 DIAGNOSIS — I472 Ventricular tachycardia, unspecified: Secondary | ICD-10-CM | POA: Diagnosis present

## 2011-02-17 DIAGNOSIS — S82843A Displaced bimalleolar fracture of unspecified lower leg, initial encounter for closed fracture: Secondary | ICD-10-CM | POA: Diagnosis present

## 2011-02-17 DIAGNOSIS — R482 Apraxia: Secondary | ICD-10-CM

## 2011-02-17 DIAGNOSIS — I4891 Unspecified atrial fibrillation: Secondary | ICD-10-CM

## 2011-02-17 DIAGNOSIS — I4729 Other ventricular tachycardia: Secondary | ICD-10-CM | POA: Diagnosis present

## 2011-02-17 DIAGNOSIS — G8190 Hemiplegia, unspecified affecting unspecified side: Secondary | ICD-10-CM | POA: Diagnosis present

## 2011-02-17 DIAGNOSIS — K5900 Constipation, unspecified: Secondary | ICD-10-CM | POA: Diagnosis not present

## 2011-02-17 DIAGNOSIS — E785 Hyperlipidemia, unspecified: Secondary | ICD-10-CM | POA: Diagnosis present

## 2011-02-17 DIAGNOSIS — J069 Acute upper respiratory infection, unspecified: Secondary | ICD-10-CM | POA: Diagnosis not present

## 2011-02-17 DIAGNOSIS — I6330 Cerebral infarction due to thrombosis of unspecified cerebral artery: Secondary | ICD-10-CM

## 2011-02-17 DIAGNOSIS — J309 Allergic rhinitis, unspecified: Secondary | ICD-10-CM | POA: Diagnosis present

## 2011-02-17 DIAGNOSIS — Z5189 Encounter for other specified aftercare: Secondary | ICD-10-CM

## 2011-02-17 DIAGNOSIS — R4701 Aphasia: Secondary | ICD-10-CM | POA: Diagnosis present

## 2011-02-17 DIAGNOSIS — I6350 Cerebral infarction due to unspecified occlusion or stenosis of unspecified cerebral artery: Secondary | ICD-10-CM | POA: Diagnosis present

## 2011-02-17 DIAGNOSIS — Z4789 Encounter for other orthopedic aftercare: Secondary | ICD-10-CM

## 2011-02-17 DIAGNOSIS — I10 Essential (primary) hypertension: Secondary | ICD-10-CM | POA: Diagnosis present

## 2011-02-17 DIAGNOSIS — Z9181 History of falling: Secondary | ICD-10-CM

## 2011-02-17 DIAGNOSIS — R488 Other symbolic dysfunctions: Secondary | ICD-10-CM | POA: Diagnosis present

## 2011-02-17 LAB — MRSA PCR SCREENING: MRSA by PCR: NEGATIVE

## 2011-02-18 DIAGNOSIS — I4891 Unspecified atrial fibrillation: Secondary | ICD-10-CM

## 2011-02-18 DIAGNOSIS — R482 Apraxia: Secondary | ICD-10-CM

## 2011-02-18 DIAGNOSIS — I6330 Cerebral infarction due to thrombosis of unspecified cerebral artery: Secondary | ICD-10-CM

## 2011-02-20 DIAGNOSIS — R482 Apraxia: Secondary | ICD-10-CM

## 2011-02-20 DIAGNOSIS — I498 Other specified cardiac arrhythmias: Secondary | ICD-10-CM

## 2011-02-20 DIAGNOSIS — I6330 Cerebral infarction due to thrombosis of unspecified cerebral artery: Secondary | ICD-10-CM

## 2011-02-20 DIAGNOSIS — I4891 Unspecified atrial fibrillation: Secondary | ICD-10-CM

## 2011-02-20 LAB — CBC
HCT: 35.1 % — ABNORMAL LOW (ref 36.0–46.0)
Hemoglobin: 12.1 g/dL (ref 12.0–15.0)
MCH: 28.9 pg (ref 26.0–34.0)
MCHC: 34.5 g/dL (ref 30.0–36.0)
MCV: 84 fL (ref 78.0–100.0)
Platelets: 282 10*3/uL (ref 150–400)
RBC: 4.18 MIL/uL (ref 3.87–5.11)
RDW: 13.8 % (ref 11.5–15.5)
WBC: 7.2 10*3/uL (ref 4.0–10.5)

## 2011-02-20 LAB — DIFFERENTIAL
Basophils Absolute: 0.1 10*3/uL (ref 0.0–0.1)
Basophils Relative: 1 % (ref 0–1)
Eosinophils Absolute: 0.1 10*3/uL (ref 0.0–0.7)
Eosinophils Relative: 2 % (ref 0–5)
Lymphocytes Relative: 26 % (ref 12–46)
Lymphs Abs: 1.9 10*3/uL (ref 0.7–4.0)
Monocytes Absolute: 0.6 10*3/uL (ref 0.1–1.0)
Monocytes Relative: 8 % (ref 3–12)
Neutro Abs: 4.6 10*3/uL (ref 1.7–7.7)
Neutrophils Relative %: 63 % (ref 43–77)

## 2011-02-20 LAB — COMPREHENSIVE METABOLIC PANEL
ALT: 17 U/L (ref 0–35)
AST: 25 U/L (ref 0–37)
Albumin: 3.1 g/dL — ABNORMAL LOW (ref 3.5–5.2)
Alkaline Phosphatase: 38 U/L — ABNORMAL LOW (ref 39–117)
BUN: 28 mg/dL — ABNORMAL HIGH (ref 6–23)
CO2: 23 mEq/L (ref 19–32)
Calcium: 9.8 mg/dL (ref 8.4–10.5)
Chloride: 100 mEq/L (ref 96–112)
Creatinine, Ser: 1.26 mg/dL — ABNORMAL HIGH (ref 0.4–1.2)
GFR calc Af Amer: 49 mL/min — ABNORMAL LOW (ref >60)
GFR calc non Af Amer: 41 mL/min — ABNORMAL LOW (ref >60)
Glucose, Bld: 106 mg/dL — ABNORMAL HIGH (ref 70–99)
Potassium: 4.3 mEq/L (ref 3.5–5.1)
Sodium: 131 mEq/L — ABNORMAL LOW (ref 135–145)
Total Bilirubin: 0.6 mg/dL (ref 0.3–1.2)
Total Protein: 6 g/dL (ref 6.0–8.3)

## 2011-02-22 ENCOUNTER — Inpatient Hospital Stay (HOSPITAL_COMMUNITY): Payer: Medicare Other

## 2011-02-27 NOTE — H&P (Signed)
NAMEDRAYA, FELKER NO.:  [id number]  MEDICAL RECORD NO.:  [id number]           PATIENT TYPE:  I  LOCATION:  4027                         FACILITY:  [facility]  PHYSICIAN:  Ranelle Oyster, M.D.DATE OF BIRTH:  1928/08/04  DATE OF ADMISSION:  02/17/2011 DATE OF DISCHARGE:                             HISTORY & PHYSICAL   CHIEF COMPLAINT:  Word-finding deficits and right-sided weakness.  PRIMARY CARE PHYSICIAN:  Rosalyn Gess. Norins, MD  CARDIOLOGIST:  Doylene Canning. Ladona Ridgel, MD  HISTORY OF PRESENT ILLNESS:  An 77 year old white female history of SVT and AFib who was visiting family in [place] where she developed acute right-sided weakness and aphasia.  The CT showed acute left MCA infarct. Once she developed this week, she fell and sustained a right intra- articular bimalleolar ankle fractures, was placed in a cast and nonweightbearing right lower extremity.  She did receive TPA.  EKG at admission showed AFib with rapid ventricular response.  Amiodarone was added for rate control as well as atenolol.  She was placed on rivaroxaban for CVA and AFib prophylaxis.  We were asked to follow along with this patient and felt she could benefit from an inpatient rehab stay.  The patient was transferred to rehab hospital today for admission.  REVIEW OF SYSTEMS:  Notable for the above as well as decreased hearing, occasional palpitations.  Full 12-point review is in the written H and P.  PAST MEDICAL HISTORY:  Positive for: 1. SVT and AFib. 2. Hypertension. 3. Hyperlipidemia. 4. Hysterectomy. 5. Cataract surgery.  She denies alcohol or tobacco use.  FAMILY HISTORY:  Positive for diabetes.  SOCIAL HISTORY:  The patient lives with son and granddaughter.  She one level house with no steps to enter.  Daughter is an Charity fundraiser.  Family plans to assist as needed.  ALLERGIES:  CODEINE.  HOME MEDICATIONS:  Diovan, hydrochlorothiazide and simvastatin.  LABORATORY DATA:  Hemoglobin  11.7, white count 6, platelets 156.  Sodium 134, potassium 4, BUN 20, creatinine 1.  PHYSICAL EXAMINATION:  VITAL SIGNS:  Blood pressure is 115/80, pulse is 50, respiratory rate 16. GENERAL:  The patient is pleasant.  Appears a bit fatigued, but no acute stress. HEENT:  Pupils are equally round and reactive to light.  Ear, nose and throat exams are notable for intact dentition.  Pink moist mucosa. NECK:  Supple without JVD or lymphadenopathy. CHEST:  Clear to auscultation bilaterally without wheezes, rales, or rhonchi. HEART:  Regular rhythm, but bradycardic at 50 and heart sounds were distant. EXTREMITIES:  No clubbing, cyanosis or edema. ABDOMEN:  Soft, nontender.  Bowel sounds are positive. SKIN:  Generally intact throughout.  Right leg was in a bivalved cast and toes were vascularly intact. NEUROLOGICALLY:  Cranial nerves II through VII showed mild right central VII and tongue deviation.  Reflexes are 1+.  Sensation is grossly intact to pinprick and light touch throughout.  The patient had minimal pronator drift to the right.  Strength was 4+/5 throughout on the right compared to 4+ to 5/5 on the left.  She was not tested at the ankle obviously on the right due to her  cast.  The patient did have some word- finding deficits and some receptive aphasia as well.  She also appeared to have verbal apraxia as well as motor apraxia, but with extra time could sequence through simple tasks.  Demeanor was very pleasant.  She was oriented to date and month with extra time.  She could not remember the year, but some of this was also language related.  POST ADMISSION PHYSICIAN EVALUATION: 1. Functional deficit secondary to left MCA infarct with mild right     hemiparesis, aphasia, and apraxia.  The patient also with right     bimalleolar ankle fracture. 2. The patient was admitted to receive collaborative interdisciplinary     care between the physiatrist, rehab nursing staff and therapy  team. 3. The patient's level of medical complexity and substantial therapy     needs in context of that medical necessity cannot be provided at a     lesser intensity of care. 4. The patient has experienced substantial functional loss from her     baseline.  Premorbidly, she was independent.  Currently, she is min     assist bed mobility transfers, supervision to min assist with upper     body to lower body ADLs.  Judging by the patient's diagnosis,     physical exam and functional history, she has the potential for     functional progress, which result in measurable gains while in     inpatient rehab.  These gains will be of substantial and practical     use upon discharge to home in facilitating mobility and self-care. 5. Physiatrist will provide 24-hour management of medical needs as     well as oversight of the therapy plans/treatment and provide     guidance as appropriate guarding interaction of the two.  Medical     problem list and plan are below. 6. A 24-hour rehab nursing team will assist in management of the     patient's skin care needs as well as nutrition and integration of     therapy concept, techniques, education, etc. 7. PT will assess and treat for lower extremity strength, range of     motion, functional mobility, gait, cognitive perceptual and visual     perceptual, education, safety with goals supervision to modified     independent. 8. OT will assess and treat for upper extremity use, ADLs, adaptive     techniques, equipment, neuromuscular education, cognitive     perceptual training and functional mobility with goals modified     independent to supervision. 9. Speech Language Pathology will assess and treat for indication and     language deficits with goals modified independent to occasional min     assist for higher-level tasks. 10.Case management and social worker will assess and treat for     psychosocial issues and discharge planning. 11.Team conference  will be held weekly to assess progress towards     goals and to determine barriers to discharge. 12.The patient has demonstrated sufficient medical stability and     exercise capacity to tolerate at least 3 hours of therapy per day     at least 5 days week. 13.Estimated length of stay is approximately 10-14 days.  Prognosis is     good.  MEDICAL PROBLEM LIST AND PLAN: 1. Stroke anticoagulation with rivaroxaban.  No monitoring is needed     with this. 2. Blood pressure, heart rate controlled with amiodarone, atenolol,     hydrochlorothiazide and valsartan.  Need  to watch closely for heart     rate with medications.  We will consult with Cardiology on Monday     for assessment as needed. 3. Ensure right intra-articular bimalleolar fracture stability.  Pt is in a bivalved cast     and we will ask the Orthopedic Surgery for assessment perhaps here     while on rehab.  The patient is to remain nonweight-bearing on the     right leg. 4. Mood:  Team to provide emotional support and social worker to     provide to perform a depression screen.     Ranelle Oyster, M.D.     ZTS/MEDQ  D:  02/17/2011  T:  02/18/2011  Job:  [id number]  cc:   Rosalyn Gess. Norins, MD  Electronically Signed by Faith Rogue M.D. on 02/27/2011 10:13:10 AM

## 2011-02-28 ENCOUNTER — Encounter: Payer: Self-pay | Admitting: Internal Medicine

## 2011-02-28 DIAGNOSIS — R482 Apraxia: Secondary | ICD-10-CM

## 2011-02-28 DIAGNOSIS — I4891 Unspecified atrial fibrillation: Secondary | ICD-10-CM

## 2011-02-28 DIAGNOSIS — I6330 Cerebral infarction due to thrombosis of unspecified cerebral artery: Secondary | ICD-10-CM

## 2011-03-01 ENCOUNTER — Ambulatory Visit: Payer: BC Managed Care – HMO | Admitting: Physical Therapy

## 2011-03-01 ENCOUNTER — Ambulatory Visit: Payer: BC Managed Care – HMO | Admitting: Occupational Therapy

## 2011-03-01 ENCOUNTER — Ambulatory Visit: Payer: BC Managed Care – HMO | Admitting: Speech Pathology

## 2011-03-01 NOTE — Consult Note (Signed)
NAMESTEPHANINE, REAS NO.:  [id number]  MEDICAL RECORD NO.:  [id number]           PATIENT TYPE:  I  LOCATION:  4027                         FACILITY:  [facility]  PHYSICIAN:   C. Eden Emms, MD, FACCDATE OF BIRTH:  1928/06/15  DATE OF CONSULTATION:  02/20/2011 DATE OF DISCHARGE:                                CONSULTATION   PRIMARY CARE PHYSICIAN:  Rosalyn Gess. Norins, MD  PRIMARY CARDIOLOGIST:  Doylene Canning. Ladona Ridgel, MD  CHIEF COMPLAINT:  Bradycardia.  HISTORY OF PRESENT ILLNESS:  [name] is an 77 year old female with a history of atrial arrhythmias, felt to be SVT and PACs.  She was on vacation in [place] when she had a CVA on February 07, 2011.  Per a dictation, the initial EKG was atrial fibrillation, but this is not available for review.  She was in sinus bradycardia in [place].  She had been on Cardizem prior to admission as well as flecainide.  These were discontinued and she was placed on amiodarone and atenolol.  She was transferred to [facility] on February 17, 2011.  Since then she has had heart rates in the 40s and 50s, sinus bradycardia. Cardiology was asked to evaluate her.  [name] is making a slow recovery from her stroke.  She denies palpitations, chest pain, or dyspnea on exertion.  She was not having symptoms prior to the acute event and is not currently having symptoms. She sometimes has difficulty finding words, but appears to comprehend questions well and answers appropriately.  Her daughter is with her and confirms the data.  She denies presyncope or syncope.  She is resting comfortably.  PAST MEDICAL HISTORY: 1. History of atrial arrhythmias, previously documented nonsustained     SVT and sinus tach with PACs. 2. Paroxysmal atrial fibrillation seen in [place] on admission. 3. Osteoarthritis. 4. History of colon polyps. 5. Hypertension.  SURGICAL HISTORY:  She is status post pilonidal cyst excision as well as hysterectomy,  left breast lumpectomy, and knee surgery.  She has had bilateral cataract surgery.  ALLERGIES:  CODEINE.  CURRENT MEDICATIONS: 1. Amiodarone 200 mg q.12 h. 2. Atenolol 25 mg a day. 3. Pepcid 20 mg b.i.d. 4. Hydrochlorothiazide 12.5 mg a day. 5. Benicar 40 mg a day. 6. Xarelto 15 mg a day. 7. Zocor 10 mg a day.  SOCIAL HISTORY:  She lives in [place] with family.  She is a retired Dipl[employer] Services operational officer.  She is a widow.  She has no history of alcohol, tobacco, or drug abuse.  Prior to the acute event, she was active around the house and garden.  FAMILY HISTORY:  Both parents are deceased and her mother had coronary artery disease, but her father did not when he died at 77.  No siblings have heart disease.  REVIEW OF SYSTEMS:  She denies chest pain, shortness of breath, dyspnea on exertion, palpitations, presyncope, or syncope.  She rarely complains of pain from her right ankle, which is fractured and immobilized.  She denies any paresthesias or numbness or tingling.  She still has right- sided weakness and difficulty with speech, but this is improving.  Full  14-point review of systems is otherwise negative except as stated in the HPI.  PHYSICAL EXAMINATION:  VITAL SIGNS:  Temperature 97.9, blood pressure 136/79, pulse 52, respiratory rate 16, O2 saturation 97% on room air. GENERAL:  She is a frail elderly white female in no acute distress. HEENT:  Normal for age. NECK:  There is no lymphadenopathy, thyromegaly, bruit, or JVD noted. CV:  Her heart is regular in rate and rhythm with an S1 and S2.  No significant murmur, rub, or gallop is noted.  Distal pulses are intact in her left lower extremity and both upper extremities.  Her right lower extremity is immobilized.  Her toes are warm and capillary refill is within normal limits.  Right-sided femoral pulses are not palpable because of the immobilization. LUNGS:  Clear to auscultation bilaterally. SKIN:  No rashes are noted and she has  some areas of ecchymosis that her resolving. ABDOMEN:  Soft and nontender with active bowel sounds. EXTREMITIES:  There is no cyanosis, clubbing, or edema noted. MUSCULOSKELETAL:  There is no joint deformity or effusions and no spine or CVA tenderness. NEURO:  She is alert and oriented x3 with minimal right-sided weakness noted throughout.  EKG, sinus bradycardia, rate 46 with no acute ischemic changes and incomplete right bundle-branch block.  LABORATORY VALUES:  Hemoglobin 12.1, hematocrit 35.1, WBC 7.2, platelets 282.  Sodium 131, potassium 4.3, chloride 100, CO2 of 23, BUN 28, creatinine 1.26, glucose 106.  IMPRESSION:  [name] was seen by Dr. Eden Emms, the patient evaluated and the data reviewed.  In [place], she had an echocardiogram that showed preserved left ventricular function.  She was in rapid atrial fibrillation on admission per the notes, although the EKG is not available for review.  She received TPA and is improving.  She has had no documented atrial fibrillation here.  Her EKG in January was read as atrial fibrillation, but was actually sinus rhythm with PACs.  She is in normal sinus rhythm now.  She is anticoagulated.  She should be continued on amiodarone and Xarelto.  We will discontinue the atenolol with her resting bradycardia.  She is encouraged to continue with rehab here.     Theodore Demark, PA-C   ______________________________ Noralyn Pick. Eden Emms, MD, [facility]    RB/MEDQ  D:  02/20/2011  T:  02/21/2011  Job:  [id number]  Electronically Signed by Theodore Demark PA-C on 02/21/2011 04:59:50 PM Electronically Signed by Charlton Haws MD [facility] on 03/01/2011 11:10:36 PM

## 2011-03-02 ENCOUNTER — Ambulatory Visit: Payer: Medicare Other | Admitting: Physical Therapy

## 2011-03-02 ENCOUNTER — Telehealth: Payer: Self-pay | Admitting: Internal Medicine

## 2011-03-02 ENCOUNTER — Ambulatory Visit: Payer: Medicare Other | Attending: Physical Medicine & Rehabilitation | Admitting: Occupational Therapy

## 2011-03-02 DIAGNOSIS — R269 Unspecified abnormalities of gait and mobility: Secondary | ICD-10-CM | POA: Insufficient documentation

## 2011-03-02 DIAGNOSIS — R279 Unspecified lack of coordination: Secondary | ICD-10-CM | POA: Insufficient documentation

## 2011-03-02 DIAGNOSIS — Z5189 Encounter for other specified aftercare: Secondary | ICD-10-CM | POA: Insufficient documentation

## 2011-03-02 DIAGNOSIS — M6281 Muscle weakness (generalized): Secondary | ICD-10-CM | POA: Insufficient documentation

## 2011-03-02 DIAGNOSIS — I69998 Other sequelae following unspecified cerebrovascular disease: Secondary | ICD-10-CM | POA: Insufficient documentation

## 2011-03-02 DIAGNOSIS — I69919 Unspecified symptoms and signs involving cognitive functions following unspecified cerebrovascular disease: Secondary | ICD-10-CM | POA: Insufficient documentation

## 2011-03-02 NOTE — Telephone Encounter (Signed)
Spoke with patients daughter and she does not want her mother to have the surgery as an outpatient.  Also worried about the risk and if she even really has to have it done.  Will we please review and talk with Dr Lestine Box

## 2011-03-02 NOTE — Discharge Summary (Signed)
[name], [name] NO.:  [id number]  MEDICAL RECORD NO.:  [id number]           PATIENT TYPE:  I  LOCATION:  4027                         FACILITY:  [facility]  PHYSICIAN:  Erick Colace, M.D.DATE OF BIRTH:  August 06, 1928  DATE OF ADMISSION:  02/17/2011 DATE OF DISCHARGE:  02/28/2011                              DISCHARGE SUMMARY   DISCHARGE DIAGNOSES: 1. Left middle cerebral artery infarction. 2. Xarelto for stroke prophylaxis. 3. Atrial fibrillation. 4. Hyperlipidemia. 5. Right intra-articular bimalleolar ankle fracture.  This is an 77 year old female with history of atrial fibrillation from [place], who had been visiting her family in [place] when she developed acute right-sided weakness, aphasia, and fall.  Cranial CT scan showed a left middle cerebral artery infarction.  Noted right ankle pain with x-ray showing a right intra-articular bimalleolar ankle fracture, placed in a cast, nonweightbearing.  She did receive TPA.  EKG with atrial fibrillation.  Echocardiogram with ejection fraction of 68% without thrombus.  Left ventricular systolic function was normal. Cardiac status remained stable.  She had been placed on rivaroxaban for stroke prophylaxis and atrial fibrillation.  She was minimal assist for mobility and transfer.  She was admitted for comprehensive rehab program.  PAST MEDICAL HISTORY:  See discharge diagnoses.  No alcohol or tobacco.  ALLERGIES:  CODEINE.  SOCIAL HISTORY:  Lives with her son and granddaughter, 1-level home.  No steps to entry.  Daughter is a Designer, jewellery.  Family plans to assist as needed.  Functional history prior to admission was independent. Functional status upon admission to rehab services was minimal assist bed mobility and transfers, supervision upper body, minimal assist lower body activities of daily living.  MEDICATIONS:  Prior to admission were Diovan with hydrochlorothiazide and Zocor  daily.  PHYSICAL EXAMINATION:  VITAL SIGNS:  Blood pressure 128/70, pulse 50, temperature 98, respirations 18. NEUROLOGIC:  This was an alert female in no acute distress.  She was aphasic.  She followed demonstrated commands.  Deep tendon reflexes 2+. She had a split cast to the right lower extremity. LUNGS:  Clear to auscultation. CARDIAC:  Rate controlled. ABDOMEN:  Soft, nontender.  Good bowel sounds.  REHABILITATION HOSPITAL COURSE:  The patient was admitted to inpatient rehab services with therapies initiated on a 3-hour daily basis consisting of physical therapy, occupational therapy, speech therapy, and rehabilitation nursing.  The following issues were addressed during the patient's rehabilitation stay.  Pertaining to Ms. Colello's left middle cerebral artery infarction remained stable.  She continued to participate fully with her therapies.  She was maintained on both Xarelto for stroke prophylaxis and atrial fibrillation.  She received followup by Cardiology Services, Dr. Eden Emms.  Her atenolol was discontinued on February 20, 2011.  She remained on amiodarone.  She had no chest pain or shortness of breath.  In relation to her right bimalleolar ankle fracture, she received followup by Orthopedic Services.  Ultimate plan was for open reduction and internal fixation, which will be completed on an outpatient basis.  Neurovascular sensation remained intact.  The split cast remained in place and she was nonweightbearing. She continued on Zocor for hyperlipidemia.  Her blood pressures were well-controlled with no orthostatic changes.  The patient received weekly collaborative interdisciplinary team conferences to discuss the estimated length of stay, family teaching, and any barriers to her discharge.  She did remain somewhat limited due to her weightbearing status.  She was minimal assist for sit to stand, minimal assist for toilet transfers, needed cues to maintain her  nonweightbearing status. She was able to communicate her needs.  She was continent of bowel and bladder.  She was on a regular diet, which she tolerated well.  Latest labs showed a hemoglobin 12.1, hematocrit 35, platelet 282,000. Sodium 131, potassium 4.3, BUN 28, creatinine 1.26.  Full family teaching was completed.  Plans to be discharged to home with ongoing therapies as dictated per rehab services.  DISCHARGE MEDICATIONS:  Included: 1. Amiodarone 200 mg every 12 hours. 2. Benicar with hydrochlorothiazide 40/12.5 daily. 3. Zocor 10 mg daily. 4. MiraLax as needed. 5. Xarelto 15 mg daily. 6. Tylenol as needed.  Her diet was regular.  SPECIAL INSTRUCTIONS:  Follow up with Dr. Leonides Grills, Orthopedic Services in regard to upcoming right ankle surgery as well as follow up with Cardiology Services for preoperative clearance.  Follow up with Dr. Wynn Banker at the outpatient rehab service office as directed, Dr. Illene Regulus, Medical Management.     Mariam Dollar, P.A.   ______________________________ Erick Colace, M.D.    DA/MEDQ  D:  02/27/2011  T:  02/28/2011  Job:  [id number]  cc:   Rosalyn Gess. Norins, MD Doylene Canning. Ladona Ridgel, MD Leonides Grills, M.D.  Electronically Signed by Mariam Dollar P.A. on 02/28/2011 07:39:40 AM Electronically Signed by Claudette Laws M.D. on 03/02/2011 04:40:03 PM

## 2011-03-06 ENCOUNTER — Ambulatory Visit (HOSPITAL_BASED_OUTPATIENT_CLINIC_OR_DEPARTMENT_OTHER): Admission: RE | Admit: 2011-03-06 | Payer: Medicare Other | Source: Ambulatory Visit | Admitting: Orthopedic Surgery

## 2011-03-07 ENCOUNTER — Ambulatory Visit: Payer: MEDICARE | Admitting: Physical Therapy

## 2011-03-07 ENCOUNTER — Ambulatory Visit: Payer: Medicare Other | Attending: Physical Medicine & Rehabilitation

## 2011-03-07 DIAGNOSIS — R279 Unspecified lack of coordination: Secondary | ICD-10-CM | POA: Insufficient documentation

## 2011-03-07 DIAGNOSIS — I69998 Other sequelae following unspecified cerebrovascular disease: Secondary | ICD-10-CM | POA: Insufficient documentation

## 2011-03-07 DIAGNOSIS — I69919 Unspecified symptoms and signs involving cognitive functions following unspecified cerebrovascular disease: Secondary | ICD-10-CM | POA: Insufficient documentation

## 2011-03-07 DIAGNOSIS — Z5189 Encounter for other specified aftercare: Secondary | ICD-10-CM | POA: Insufficient documentation

## 2011-03-07 DIAGNOSIS — R269 Unspecified abnormalities of gait and mobility: Secondary | ICD-10-CM | POA: Insufficient documentation

## 2011-03-07 DIAGNOSIS — M6281 Muscle weakness (generalized): Secondary | ICD-10-CM | POA: Insufficient documentation

## 2011-03-09 ENCOUNTER — Ambulatory Visit (INDEPENDENT_AMBULATORY_CARE_PROVIDER_SITE_OTHER): Payer: Medicare Other | Admitting: Internal Medicine

## 2011-03-09 ENCOUNTER — Encounter: Payer: Self-pay | Admitting: Internal Medicine

## 2011-03-09 DIAGNOSIS — Z8673 Personal history of transient ischemic attack (TIA), and cerebral infarction without residual deficits: Secondary | ICD-10-CM | POA: Insufficient documentation

## 2011-03-09 DIAGNOSIS — I6350 Cerebral infarction due to unspecified occlusion or stenosis of unspecified cerebral artery: Secondary | ICD-10-CM

## 2011-03-09 DIAGNOSIS — I10 Essential (primary) hypertension: Secondary | ICD-10-CM

## 2011-03-09 DIAGNOSIS — I639 Cerebral infarction, unspecified: Secondary | ICD-10-CM

## 2011-03-09 DIAGNOSIS — I4891 Unspecified atrial fibrillation: Secondary | ICD-10-CM

## 2011-03-09 DIAGNOSIS — I471 Supraventricular tachycardia: Secondary | ICD-10-CM

## 2011-03-09 MED ORDER — AMIODARONE HCL 200 MG PO TABS: 200.0000 mg | ORAL_TABLET | Freq: Every day | ORAL | Status: DC

## 2011-03-09 NOTE — Assessment & Plan Note (Signed)
Pressure today is still not well-controlled. I recommend a low sodium diet. She will continue with her current medications. I have asked her to continue to monitor her blood pressure. She will follow up with Dr. Arthur Holms and regarding this.

## 2011-03-09 NOTE — Patient Instructions (Signed)
Your physician wants you to follow-up in: 3 months with Dr Court Joy will receive a reminder letter in the mail two months in advance. If you don't receive a letter, please call our office to schedule the follow-up appointment.  Decrease Amiodarone to 200 mg daily on 03/18/2011

## 2011-03-09 NOTE — Assessment & Plan Note (Signed)
She has had a very nice recovery. Her dysphasia is minimal and her arm strength is almost normal.

## 2011-03-09 NOTE — Assessment & Plan Note (Signed)
The patient has never had any absolute certain atrial fibrillation. She has had several episodes of SVT. Because of her stroke the presumption is that she in fact did have atrial fibrillation. She will be maintained on anticoagulation. For now she will continue on her current medication which is Xarelto.

## 2011-03-09 NOTE — Progress Notes (Signed)
HPI  [name] returns today for followup. She is an elderly woman with a history of tachycardia palpitations and documented SVT. The patient had been treated with antiarrhythmic drug therapy with flecainide. The patient had never had documented atrial fibrillation. She sustained a stroke several months ago. At that time she was visiting in [place] and had her medications changed. She is now on amiodarone and stent of the flecainide. She has rare palpitations. She has had no other documented arrhythmias. Because of her stroke she was placed on anticoagulation with Xarelto. She denies chest pain or shortness of breath. She is coming along very nicely with her therapy and has very minimal residual from her stroke.  Allergies  Allergen Reactions  . Codeine     REACTION: fine rash     Current Outpatient Prescriptions  Medication Sig Dispense Refill  . acetaminophen (TYLENOL) 325 MG tablet Take 650 mg by mouth every 6 (six) hours as needed.        Marland Kitchen amiodarone (PACERONE) 200 MG tablet Take 200 mg by mouth 2 (two) times daily.        . folic acid-pyridoxine-cyancobalamin (FOLBIC) 2.5-25-2 MG TABS Take 1 tablet by mouth daily.        . nitroGLYCERIN (NITROSTAT) 0.4 MG SL tablet Place 0.4 mg under the tongue every 5 (five) minutes as needed.        . polyethylene glycol (MIRALAX / GLYCOLAX) packet Take 17 g by mouth daily.        . Rivaroxaban (XARELTO) 15 MG TABS Take by mouth daily.        . simvastatin (ZOCOR) 10 MG tablet Take 10 mg by mouth at bedtime.        . valsartan-hydrochlorothiazide (DIOVAN-HCT) 160-12.5 MG per tablet Take 1 tablet by mouth daily.        Marland Kitchen DISCONTD: aspirin 325 MG tablet Take 325 mg by mouth 2 (two) times daily.        Marland Kitchen DISCONTD: calcium carbonate (TUMS - DOSED IN MG ELEMENTAL CALCIUM) 500 MG chewable tablet Chew 1 tablet by mouth as directed.        Marland Kitchen DISCONTD: diltiazem (CARDIZEM CD) 240 MG 24 hr capsule Take 240 mg by mouth daily.        Marland Kitchen DISCONTD: fish  oil-omega-3 fatty acids 1000 MG capsule Take 2 g by mouth 3 (three) times daily.        Marland Kitchen DISCONTD: flecainide (TAMBOCOR) 50 MG tablet Take 50 mg by mouth 2 (two) times daily.           Past Medical History  Diagnosis Date  . HTN (hypertension)   . History of colonic polyps   . Supraventricular tachycardia   . Cervical arthritis   . Hip arthritis     ROS:   All systems reviewed and negative except as noted in the HPI.   Past Surgical History  Procedure Date  . Pilonidal cyst excision   . Breast lumpectomy     left  . Fb removal from popliteal fossae   . Hysterectomy-fibroid tumors   . Intracapsular cataract extraction      Left eye Oct 18, 2009  right eye Jan '11 By Dr Doloris Hall     Family History  Problem Relation Age of Onset  . Heart attack Mother   . Stroke Mother   . Coronary artery disease Mother   . Cancer Father   . Diabetes Daughter   . Cancer Daughter     Leukemia  History   Social History  . Marital Status: Widowed    Spouse Name: N/A    Number of Children: N/A  . Years of Education: N/A   Occupational History  . Not on file.   Social History Main Topics  . Smoking status: Never Smoker   . Smokeless tobacco: Not on file  . Alcohol Use: No  . Drug Use: No  . Sexually Active: Not on file   Other Topics Concern  . Not on file   Social History Narrative   married - 1951- widowed '094-daughters, 1 died leukemia2 sons10 grandchildren5 great-grandchildrenI-ADLsRetired Tobacco Use - No. Alcohol Use - noCaffeine use/day:  2 cups per dayDental Care w/in 6 mos.:  yesSun Exposure-Excessive:  noSeat Belt Use:  yesFall Risk:  slight fall riskBlood Transfusions:  noHepatitis Risk:  no risk notedHIV Risk:  no risk notedSTD Risk:  no risk notedDrug Use:  never     BP 158/70  Pulse 79  Ht 5' (1.524 m)  Wt 240 lb (108.863 kg)  BMI 46.87 kg/m2  Physical Exam:  Well appearing NAD HEENT: Unremarkable Neck:  No JVD, no thyromegally Lymphatics:   No adenopathy Back:  No CVA tenderness Lungs:  Clear HEART:  Regular rate rhythm, no murmurs, no rubs, no clicks Abd:  Flat, positive bowel sounds, no organomegally, no rebound, no guarding Ext:  2 plus pulses, no edema, no cyanosis, no clubbing Skin:  No rashes no nodules Neuro:  CN II through XII intact, motor grossly intact  EKG Normal sinus rhythm. Rate 79 beats per minute. Left anterior fascicular block.   Assess/Plan:

## 2011-03-13 ENCOUNTER — Ambulatory Visit: Payer: MEDICARE | Admitting: Physical Therapy

## 2011-03-14 ENCOUNTER — Ambulatory Visit: Payer: MEDICARE | Admitting: Physical Therapy

## 2011-03-14 ENCOUNTER — Ambulatory Visit: Payer: Medicare Other | Admitting: Occupational Therapy

## 2011-03-15 NOTE — Progress Notes (Signed)
Addended by: Laurance Flatten on: 03/15/2011 09:07 AM   Modules accepted: Orders

## 2011-03-16 ENCOUNTER — Ambulatory Visit: Payer: Medicare Other

## 2011-03-16 ENCOUNTER — Ambulatory Visit: Payer: Medicare Other | Admitting: Occupational Therapy

## 2011-03-16 ENCOUNTER — Encounter: Payer: Self-pay | Admitting: Internal Medicine

## 2011-03-21 ENCOUNTER — Ambulatory Visit: Payer: MEDICARE | Admitting: Physical Therapy

## 2011-03-21 ENCOUNTER — Ambulatory Visit: Payer: Medicare Other | Admitting: Occupational Therapy

## 2011-03-21 ENCOUNTER — Ambulatory Visit: Payer: Medicare Other | Admitting: Physical Therapy

## 2011-03-21 ENCOUNTER — Ambulatory Visit: Payer: Medicare Other | Attending: Orthopedic Surgery | Admitting: Physical Therapy

## 2011-03-21 ENCOUNTER — Ambulatory Visit: Payer: Medicare Other

## 2011-03-21 DIAGNOSIS — M25673 Stiffness of unspecified ankle, not elsewhere classified: Secondary | ICD-10-CM | POA: Insufficient documentation

## 2011-03-21 DIAGNOSIS — I69998 Other sequelae following unspecified cerebrovascular disease: Secondary | ICD-10-CM | POA: Insufficient documentation

## 2011-03-21 DIAGNOSIS — M25676 Stiffness of unspecified foot, not elsewhere classified: Secondary | ICD-10-CM | POA: Insufficient documentation

## 2011-03-21 DIAGNOSIS — R269 Unspecified abnormalities of gait and mobility: Secondary | ICD-10-CM | POA: Insufficient documentation

## 2011-03-21 DIAGNOSIS — Z5189 Encounter for other specified aftercare: Secondary | ICD-10-CM | POA: Insufficient documentation

## 2011-03-23 ENCOUNTER — Ambulatory Visit: Payer: Medicare Other | Admitting: Occupational Therapy

## 2011-03-23 ENCOUNTER — Ambulatory Visit: Payer: Medicare Other

## 2011-03-24 ENCOUNTER — Other Ambulatory Visit: Payer: Self-pay | Admitting: *Deleted

## 2011-03-24 ENCOUNTER — Ambulatory Visit: Payer: Medicare Other | Admitting: Physical Therapy

## 2011-03-24 ENCOUNTER — Ambulatory Visit: Payer: MEDICARE | Admitting: Physical Therapy

## 2011-03-24 DIAGNOSIS — I4891 Unspecified atrial fibrillation: Secondary | ICD-10-CM

## 2011-03-24 MED ORDER — AMIODARONE HCL 200 MG PO TABS: 200.0000 mg | ORAL_TABLET | Freq: Every day | ORAL | Status: DC

## 2011-03-24 MED ORDER — RIVAROXABAN 15 MG PO TABS: 1.0000 | ORAL_TABLET | Freq: Every day | ORAL | Status: DC

## 2011-03-24 NOTE — Telephone Encounter (Signed)
Pt wants to know if Dr. Ladona Ridgel will refill Xarelto 15 mg this was prescribed by Dr. Cato Mulligan

## 2011-03-24 NOTE — Progress Notes (Signed)
Addended by: Laurance Flatten on: 03/24/2011 01:02 PM   Modules accepted: Orders

## 2011-03-28 ENCOUNTER — Ambulatory Visit: Payer: MEDICARE | Admitting: Physical Therapy

## 2011-03-28 ENCOUNTER — Ambulatory Visit: Payer: Medicare Other

## 2011-03-28 ENCOUNTER — Ambulatory Visit: Payer: Medicare Other | Admitting: Physical Therapy

## 2011-03-28 ENCOUNTER — Ambulatory Visit: Payer: Medicare Other | Admitting: Occupational Therapy

## 2011-03-30 ENCOUNTER — Telehealth: Payer: Self-pay | Admitting: Internal Medicine

## 2011-03-30 NOTE — Telephone Encounter (Signed)
Please clarify direction & quanity amiodaroine 200 mg. Phone # [phone]. Ref # [id number].

## 2011-03-30 NOTE — Telephone Encounter (Signed)
Spoke with [facility] medication question answered

## 2011-03-30 NOTE — Telephone Encounter (Signed)
Pt's dtr calling to say [facility] has been trying to reach [place] re clarification on an rx we called in-pt's dtr req a call when done, Pt's [phone]

## 2011-03-30 NOTE — Telephone Encounter (Signed)
[facility] called and daughter aware

## 2011-03-31 ENCOUNTER — Ambulatory Visit: Payer: MEDICARE | Admitting: Physical Therapy

## 2011-03-31 ENCOUNTER — Ambulatory Visit: Payer: Medicare Other | Admitting: Occupational Therapy

## 2011-03-31 ENCOUNTER — Ambulatory Visit: Payer: Medicare Other | Admitting: Physical Therapy

## 2011-04-03 ENCOUNTER — Encounter: Payer: MEDICARE | Admitting: Occupational Therapy

## 2011-04-03 ENCOUNTER — Ambulatory Visit: Payer: MEDICARE | Admitting: Physical Therapy

## 2011-04-03 ENCOUNTER — Ambulatory Visit: Payer: Medicare Other | Admitting: Physical Therapy

## 2011-04-04 ENCOUNTER — Encounter: Payer: Medicare Other | Attending: Physical Medicine & Rehabilitation

## 2011-04-04 ENCOUNTER — Inpatient Hospital Stay: Payer: Medicare Other | Admitting: Physical Medicine & Rehabilitation

## 2011-04-04 DIAGNOSIS — I4891 Unspecified atrial fibrillation: Secondary | ICD-10-CM

## 2011-04-04 DIAGNOSIS — I69998 Other sequelae following unspecified cerebrovascular disease: Secondary | ICD-10-CM | POA: Insufficient documentation

## 2011-04-04 DIAGNOSIS — I69959 Hemiplegia and hemiparesis following unspecified cerebrovascular disease affecting unspecified side: Secondary | ICD-10-CM | POA: Insufficient documentation

## 2011-04-04 DIAGNOSIS — I69921 Dysphasia following unspecified cerebrovascular disease: Secondary | ICD-10-CM

## 2011-04-04 DIAGNOSIS — R209 Unspecified disturbances of skin sensation: Secondary | ICD-10-CM

## 2011-04-04 DIAGNOSIS — Z79899 Other long term (current) drug therapy: Secondary | ICD-10-CM | POA: Insufficient documentation

## 2011-04-04 DIAGNOSIS — I69920 Aphasia following unspecified cerebrovascular disease: Secondary | ICD-10-CM | POA: Insufficient documentation

## 2011-04-04 DIAGNOSIS — I10 Essential (primary) hypertension: Secondary | ICD-10-CM | POA: Insufficient documentation

## 2011-04-04 DIAGNOSIS — I519 Heart disease, unspecified: Secondary | ICD-10-CM | POA: Insufficient documentation

## 2011-04-04 DIAGNOSIS — G8110 Spastic hemiplegia affecting unspecified side: Secondary | ICD-10-CM

## 2011-04-04 DIAGNOSIS — R29898 Other symptoms and signs involving the musculoskeletal system: Secondary | ICD-10-CM | POA: Insufficient documentation

## 2011-04-04 NOTE — Progress Notes (Signed)
Addended by: Lewayne Bunting on: 04/04/2011 05:20 PM   Modules accepted: Orders

## 2011-04-05 ENCOUNTER — Ambulatory Visit: Payer: Medicare Other

## 2011-04-05 ENCOUNTER — Ambulatory Visit: Payer: MEDICARE | Admitting: Physical Therapy

## 2011-04-05 ENCOUNTER — Ambulatory Visit: Payer: Medicare Other | Admitting: Physical Therapy

## 2011-04-05 ENCOUNTER — Ambulatory Visit: Payer: Medicare Other | Attending: Physical Medicine & Rehabilitation | Admitting: Occupational Therapy

## 2011-04-05 DIAGNOSIS — I69998 Other sequelae following unspecified cerebrovascular disease: Secondary | ICD-10-CM | POA: Insufficient documentation

## 2011-04-05 DIAGNOSIS — M6281 Muscle weakness (generalized): Secondary | ICD-10-CM | POA: Insufficient documentation

## 2011-04-05 DIAGNOSIS — R269 Unspecified abnormalities of gait and mobility: Secondary | ICD-10-CM | POA: Insufficient documentation

## 2011-04-05 DIAGNOSIS — I69919 Unspecified symptoms and signs involving cognitive functions following unspecified cerebrovascular disease: Secondary | ICD-10-CM | POA: Insufficient documentation

## 2011-04-05 DIAGNOSIS — R279 Unspecified lack of coordination: Secondary | ICD-10-CM | POA: Insufficient documentation

## 2011-04-05 DIAGNOSIS — Z5189 Encounter for other specified aftercare: Secondary | ICD-10-CM | POA: Insufficient documentation

## 2011-04-05 NOTE — Assessment & Plan Note (Signed)
REASON FOR VISIT:  Right-sided weakness due to stroke.  HISTORY:  An 77 year old female who had a left middle cerebral artery infarct while visiting her sister in [place].  She is admitted to the [facility] February 17, 2011, for additional rehabilitation.  She has a past medical history significant for atrial fibrillation with rapid ventricular response.  She has been placed on Xarelto for stroke prophylaxis.  She also had a right intra- articular bimalleolar fracture placed on a [place] nonweightbearing now is in a Personal assistant.  Upon discharge from rehabilitation, she was at a min assist level for her mobility.  She was continent to bowel and bladder.  CURRENT MEDICATIONS: 1. Amiodarone 200 mg b.i.d., this is a reduced dose. 2. Xarelto 15 mg a day. 3. Diovan 12.5 daily. 4. Simvastatin 10 mg a day. 5. Foltx OTC.  She still needs assistance with certain bathing, meal prep, household duties and shopping activities.  She has weakness on her review of systems.  She also complains of numbness in the right upper greater than lower extremity.  SOCIAL HISTORY:  Widowed, lives with son and granddaughter.  PAST HISTORY:  Heart disease, stroke and hypertension.  FAMILY HISTORY:  Hypertension and heart disease.  PHYSICAL EXAMINATION:  VITAL SIGNS:  Blood pressure 144/82, pulse 90, respirations 18 and O2 sat 98% on room air. GENERAL:  No acute distress.  Mood and affect appropriate. EXTREMITIES:  Her right upper extremity has 4-/5 strength in the deltoid, biceps, triceps, grip.  Right lower extremity not tested secondary to Cam Walker boot.  Sensation mildly reduced in the right upper extremity.  There is no evidence of facial droop.  She has minimal dysarthria.  She has mild difficulty with repetition of phrases such as "no if's, and's, or but's" or now such as [employer] or [employer].  Exam; blood pressure 144/82, pulse ranges from 90-150  irregularly irregular with auscultation, respirations 18 and O2 sat 98% on room air. She has no acute distress.  Mood and affect are appropriate.  IMPRESSION: 1. Left middle cerebral artery distribution infarct right hemiparesis     residual, mild aphasia, right hemisensory deficits as well as right     hemiparesis. 2. Atrial fibrillation with intermittent rapid ventricular response.     I discussed this with her electrophysiologist, Dr. Ladona Ridgel and given     the fact she is not symptomatic with this, no need for ED     assessment.  I did discuss both with the patient and the daughter     in the event that she becomes lightheaded, shortness of breath,     diaphoretic or     develops chest pain that she needs to go immediately to the ED.     They are aware of this and in agreement.  I will see her back in 1     month to monitor therapy progress.     Erick Colace, M.D. Electronically Signed    AEK/MedQ D:  04/04/2011 15:21:18  T:  04/05/2011 01:40:07  Job #:  [id number]  cc:   Doylene Canning. Ladona Ridgel, MD [address]  [address] [place] [address]  Rosalyn Gess. Norins, MD [address] [place] [address]

## 2011-04-06 ENCOUNTER — Telehealth: Payer: Self-pay | Admitting: *Deleted

## 2011-04-06 ENCOUNTER — Other Ambulatory Visit: Payer: Medicare Other

## 2011-04-06 DIAGNOSIS — S82843A Displaced bimalleolar fracture of unspecified lower leg, initial encounter for closed fracture: Secondary | ICD-10-CM

## 2011-04-06 NOTE — Telephone Encounter (Signed)
Dr Fonnie Jarvis wrote order for pt to have Vit D level. Pt req to have labs done at [facility] office b/c if vit D level is low Dr Debby Bud will be MD to treat. Orders entered w/Dx from Dr Fonnie Jarvis.

## 2011-04-07 LAB — VITAMIN D 25 HYDROXY (VIT D DEFICIENCY, FRACTURES): Vit D, 25-Hydroxy: 19 ng/mL — ABNORMAL LOW (ref 30–89)

## 2011-04-10 ENCOUNTER — Encounter: Payer: Medicare Other | Admitting: Occupational Therapy

## 2011-04-10 ENCOUNTER — Ambulatory Visit: Payer: Medicare Other | Admitting: Physical Therapy

## 2011-04-10 ENCOUNTER — Other Ambulatory Visit: Payer: Self-pay | Admitting: Internal Medicine

## 2011-04-10 MED ORDER — ERGOCALCIFEROL 1.25 MG (50000 UT) PO CAPS: 50000.0000 [IU] | ORAL_CAPSULE | ORAL | Status: DC

## 2011-04-10 NOTE — Telephone Encounter (Signed)
Message copied by Illene Regulus on Mon Apr 10, 2011  5:36 AM ------      Message from: Lanier Prude      Created: Fri Apr 07, 2011  4:17 PM       Please review vit d result                  ----- Message -----         From: Lab In [place] Interface         Sent: 04/07/2011   1:07 AM           To: Lamar Sprinkles, CMA

## 2011-04-10 NOTE — Telephone Encounter (Signed)
Please call patient: Vit D deficiency at 9. Plan 50,000 iu Vit D once a week for 12 weeks then maintenance dose of otc Vit D 1000 iu daily. Rx entered but not signed - need a local pharmacy for this

## 2011-04-11 ENCOUNTER — Telehealth: Payer: Self-pay | Admitting: *Deleted

## 2011-04-11 MED ORDER — ERGOCALCIFEROL 1.25 MG (50000 UT) PO CAPS: 50000.0000 [IU] | ORAL_CAPSULE | ORAL | Status: DC

## 2011-04-11 NOTE — Telephone Encounter (Signed)
Vitamine D 19 - deficienct. Plan - ergocalciferol 50,000 once a week for 12 weeks then maintenance of 1000 iu daily. Already did Rx but it was pended until we had a Insurance claims handler.

## 2011-04-11 NOTE — Telephone Encounter (Signed)
Daughter aware.

## 2011-04-11 NOTE — Telephone Encounter (Signed)
SEE other note, daughter aware

## 2011-04-11 NOTE — Telephone Encounter (Signed)
Patient requesting results of Vit D level and any recommendations. Also would like labs faxed to Dr Jonn Shingles office.

## 2011-04-12 ENCOUNTER — Ambulatory Visit: Payer: Medicare Other | Admitting: Physical Therapy

## 2011-04-12 ENCOUNTER — Ambulatory Visit: Payer: Medicare Other | Admitting: Occupational Therapy

## 2011-04-17 ENCOUNTER — Ambulatory Visit: Payer: Medicare Other | Admitting: Physical Therapy

## 2011-04-18 NOTE — Assessment & Plan Note (Signed)
[facility]                         ELECTROPHYSIOLOGY OFFICE NOTE   SHANITA, KANAN                     MRN:          [id number]  DATE:12/17/2007                            DOB:          November 27, 1928    [name] is referred today by Dr. Illene Regulus for evaluation of  palpitations and an abnormal cardiac monitor.   HISTORY OF PRESENT ILLNESS:  The patient is a very pleasant 77 year old  woman who has a history of palpitations mostly at nighttime in the past.  She recently had to wear a cardiac monitor which demonstrated bursts of  SVT a rates of up to over 220 beats per minute.  These are a short lived  and last just a few seconds in duration.  The patient also notes  episodes of chest fullness and chest tightness.  These are not related  to exertion.  They come on spontaneously.  They stop spontaneously.  Her  episodes of chest discomfort are not correlated to her palpitations per  se.  She has never had frank syncope, although she does note that when  her heart races she gets lightheaded and dizzy as if she might pass out.  Fortunately, her episodes of heart racing have been very short lived,  typically lasting just a few seconds at a time.  Interestingly enough,  on cardiac monitoring, besides the episodes of SVT at rates of over 220  beats per minute, the patient did have episodes of normal sinus rhythm,  where she had very marked and dramatic ST-segment depression.  This was  not in the setting of tachycardia.  She is now referred for additional  evaluation.  She denies frank syncope.   FAMILY HISTORY:  Otherwise notable for both of her parents being  deceased.  Her mother died of a stroke.  Her father died of  complications of colon cancer.   REVIEW OF SYSTEMS:  Notable that the patient wears glasses.  She denies  any vision or hearing problems.  She does have dyspnea when she feels  palpitations.  She has rare episodes of chest pain  which are not related  to exertion, as previously stated.  There are no specific GI complaints  at present.  She denies dysuria, hematuria, or nocturia.  Otherwise, all  systems were reviewed and found to be negative, except for arthritis in  the hips.   SOCIAL HISTORY:  The patient is retired.  She denies tobacco or ethanol  abuse.   PHYSICAL EXAMINATION:  GENERAL:  Notable for her being a pleasant  elderly woman, in no acute distress.  VITAL SIGNS:  The blood pressure today was 177/85, the pulse 82 and  regular, respirations were 18.  Her weight was 150 pounds.  HEENT:  Normocephalic and atraumatic.  Pupils equal and round.  The  oropharynx was moist.  The sclerae were anicteric.  NECK:  Revealed no jugular distention.  LUNGS:  Clear bilaterally to auscultation.  No wheezes, rales, or  rhonchi are present.  There is no increased work of breathing.  CARDIOVASCULAR:  Revealed a regular rate and rhythm.  Normal S1  and S2.  There were no murmurs, rubs, or gallops.  ABDOMEN:  Soft, nontender.  There is organomegaly at present.  EXTREMITIES:  Demonstrated no cyanosis, clubbing, or edema.  The pulses  were 2+ and symmetric.  NEUROLOGIC:  Alert and oriented x3, with cranial nerves intact.   IMPRESSION:  1. Symptomatic brief episodes of supraventricular tachycardia, with      very rapid heart rates.  2. Near syncope secondary to #1.  3. Abnormal EKG, with marked ST-segment depression transiently. of      unknown clinical significance.   DISCUSSION:  Overall, [name] is stable, but I am concerned about her  EKG and have asked that she undergo a stress Myoview study to further  characterize the significance of the ST-segment changes.  As it pertains  to her SVT, she will continue verapamil for now.  If her symptoms of SVT  increase, then we will consider electrical study and ablation, although  with her very brief episodes I am not sure this would be warranted at  the present time.   Antiarrhythmic drug therapy would be a strong  consideration as well.     Doylene Canning. Ladona Ridgel, MD  Electronically Signed    GWT/MedQ  DD: 12/19/2007  DT: 12/20/2007  Job #: [id number]   cc:   Rosalyn Gess. Norins, MD

## 2011-04-18 NOTE — Assessment & Plan Note (Signed)
 [facility]ALTH[facility]                         ELECTROPHYSIOLOGY OFFICE NOTE   [name], [name]                     MRN:          [id number]  DATE:02/18/2009                            DOB:          [birth year]    [name] returns today for followup.  I saw her over a year ago for  palpitations and documented not brief episodes of SVT.  At that time, I  thought her spells were not particularly severe and we recommended  verapamil, which she took and has tolerated very well.  Since then, she  has rare episodes of that she describes as an electric feeling inside  her body, feeling palpitations.  These spells are very infrequent and  last for just seconds at a time.  She returns today for followup.   MEDICATIONS:  1. Diovan/hydrochlorothiazide.  2. Folamin 2/25/2.5 daily.  3. Verapamil 180 mg daily.  4. Simvastatin 10 mg daily.   PHYSICAL EXAMINATION:  GENERAL:  She is a very pleasant elderly woman in  no acute distress.  VITAL SIGNS:  Blood pressure was 148/76, the pulse was 65 and regular,  the respirations were 18, and the weight was 152 pounds.  NECK:  No jugular venous distention.  LUNGS:  Clear bilaterally to auscultation.  No wheezes, rales, or  rhonchi are present.  There is no increased work of breathing.  CARDIOVASCULAR:  Regular rate and rhythm.  Normal S1 and S2.  The PMI is  not enlarged or laterally displaced.  ABDOMEN:  Soft and nontender.  EXTREMITIES:  No edema.   EKG demonstrates sinus rhythm with left anterior fascicular block.   IMPRESSION:  1. Palpitations.  2. Documented brief episodes of nonsustained supraventricular      tachycardia, controlled with verapamil.  3. Hypertension.   DISCUSSION:  [name] is stable.  I have asked that if her  palpitations get worse, that she could take an extra verapamil as  needed.  Obviously if this does not help consideration for catheter  ablation, that would be warranted, but at the  present time I do not  think the mild nature  of her symptoms would recommend that we do an electrical test.  I will  plan to see the patient back in the office for followup in 1 year or  sooner should she have worsening symptoms.      Doylene Canning. Ladona Ridgel, MD  Electronically Signed    GWT/MedQ  DD: 02/18/2009  DT: 02/19/2009  Job #: [id number]

## 2011-04-18 NOTE — Assessment & Plan Note (Signed)
[facility]                         ELECTROPHYSIOLOGY OFFICE NOTE   [name], [name]                     MRN:          [id number]  DATE:02/10/2008                            DOB:          04/11/1928    [name] returns today for follow up.  [name] is a very pleasant 77-year-  old woman with a history of tachypalpitations and hypertension who is  referred by Dr. Debby Bud because of recurrent episodes of SVT.  I saw her  back in January, and [name] had also had some chest discomfort, and we had  her do a stress Myoview scan, which was electrically positive, but  perfusion imaging demonstrated no evidence of ischemia or scar.  The  patient was placed on verapamil at that time to try to help prevent her  episodes of palpitations and SVT, and [name] has only had one episode in  the last 2 months which was very short limited, lasting about 1 hour.  [name] returns today for follow up.  [name] otherwise has been stable.   PHYSICAL EXAMINATION:  VITAL SIGNS:  The blood pressure today was  142/68, the pulse was 64 and regular, respirations were 18.  Weight was  151 pounds.  NECK:  No jugular distention.  LUNGS:  Clear bilaterally to auscultation.  No wheezes, rales or rhonchi  were present.  CARDIOVASCULAR:  Regular rate and rhythm.  Normal S1 and S2.  EXTREMITIES:  No cyanosis, clubbing or edema.  Pulses were 2+ and  symmetric.  NEUROLOGIC:  Alert and oriented x3.   MEDICATIONS:  1. Diovan/HCTZ 160/12.5.  2. Verapamil 180 daily.  3. Simvastatin 10 a day.  4. Aspirin 325 twice a day.   IMPRESSION:  1. Recurrent supraventricular tachycardia.  2. Hypertension.   DISCUSSION:  Overall, [name] is stable.  [name] is presently not  inclined to proceed with catheter ablation but would prefer a period of  watchful waiting and trying the verapamil.  I will plan to see the  patient back in the office on a p.r.n. basis.  [name] will call us if [name]  would like to proceed  with ablation.  Otherwise, [name] will continue her  verapamil.     Doylene Canning. Ladona Ridgel, MD  Electronically Signed    GWT/MedQ  DD: 02/10/2008  DT: 02/11/2008  Job #: [id number]

## 2011-04-21 ENCOUNTER — Ambulatory Visit: Payer: Medicare Other | Admitting: Occupational Therapy

## 2011-04-21 ENCOUNTER — Ambulatory Visit: Payer: Medicare Other | Admitting: Physical Therapy

## 2011-04-21 NOTE — Op Note (Signed)
   [name], [name] NO.:  [id number]   MEDICAL RECORD NO.:  [id number]                   PATIENT TYPE:  AMB   LOCATION:  DAY                                  FACILITY:  [facility]   PHYSICIAN:  Thornton Park. Daphine Deutscher, M.D.             DATE OF BIRTH:  Mar 19, 1928   DATE OF PROCEDURE:  11/24/2002  DATE OF DISCHARGE:                                 OPERATIVE REPORT   PREOPERATIVE DIAGNOSIS:  Palpable mass, left breast.   POSTOPERATIVE DIAGNOSIS:  Palpable mass, left breast.   PROCEDURE:  Excision of mass, left breast.   SURGEON:  Thornton Park. Daphine Deutscher, M.D.   ANESTHESIA:  Local MAC.   DESCRIPTION OF PROCEDURE:  [name] is a 77 year old mother of one our  recovery room nurses here at [facility], who has had a mass in her left  breast.  She was seen in the office, and biopsy was recommended.  First the  area was localized, marked, and the breast was prepped with Betadine and  draped sterilely.  The nipple-areolar complex was infiltrated with a mixture  of Marcaine and lidocaine.  A curvilinear incision was made along the  areolar margin.  I then used sharp dissection with scissors to dissect  around the mass.  It had a bluish cast, suggesting possible bleeding into a  cystic structure.  I went deep to the mass and then transected some dilated  ducts which I subsequently went back and took deep resections of and sent as  permanent.  The mass was excised and sent for examination, fresh, by  pathology, and Berneta Levins, M.D. examined it, felt it was likely benign, but  would put it all in for permanent sections.  Meantime, the area irrigated.  Bleeding had been controlled with electrocautery.  No active bleeding was  seen.  It was closed with 4-0 Vicryl proximally in the depth of the breast  tissue and then the subcutaneous tissues with 4-0 Vicryl and then with some  subcuticular 5-0 Vicryl with Benzoin and Steri-Strips on the skin.  The  patient seemed to  tolerate the procedure well and was taken to the recovery  room in satisfactory condition.   DISCHARGE MEDICATIONS:  She will be given Vicodin to take if needed for  pain.   FOLLOW UP:  Follow up in the office in 2-3 weeks.   FINAL DIAGNOSES:  Mass, left breast, status post excision, permanent  pathology report pending.                                               Thornton Park Daphine Deutscher, M.D.    MBM/MEDQ  D:  11/24/2002  T:  11/24/2002  Job:  [id number]

## 2011-04-24 ENCOUNTER — Ambulatory Visit: Payer: Medicare Other | Admitting: Physical Therapy

## 2011-04-24 ENCOUNTER — Ambulatory Visit: Payer: Medicare Other | Admitting: Occupational Therapy

## 2011-04-26 ENCOUNTER — Encounter: Payer: Medicare Other | Admitting: Occupational Therapy

## 2011-04-26 ENCOUNTER — Ambulatory Visit: Payer: Medicare Other | Admitting: Physical Therapy

## 2011-05-02 ENCOUNTER — Ambulatory Visit: Payer: Medicare Other | Admitting: Physical Medicine & Rehabilitation

## 2011-05-02 DIAGNOSIS — I69921 Dysphasia following unspecified cerebrovascular disease: Secondary | ICD-10-CM

## 2011-05-02 DIAGNOSIS — G8110 Spastic hemiplegia affecting unspecified side: Secondary | ICD-10-CM

## 2011-05-02 DIAGNOSIS — R209 Unspecified disturbances of skin sensation: Secondary | ICD-10-CM

## 2011-05-02 DIAGNOSIS — I69998 Other sequelae following unspecified cerebrovascular disease: Secondary | ICD-10-CM

## 2011-05-03 NOTE — Assessment & Plan Note (Signed)
An 77 year old female with history of left CVA causing right hemiparesis.  She had a left middle cerebral artery distribution stroke. She had aphasia which is improving.  She has had a fall during her stroke which resulted in right intra-articular bimalleolar ankle fracture, placed in a cast boot, nonweightbearing.  During her last visit, she had tachycardia with Afib rapid response, but has no recurrence since that time.  She has no pain complaints.  Her blood pressure is 173/47.  She states usually systolic is 160.  I informed her to followup with her primary care or Cardiology to get her blood pressure medications adjusted to reduce stroke risk.  Pulse is 61, respirations 18, O2 sat 100% room air.  SOCIAL HISTORY:  Widowed, accompanied by her daughter.  PHYSICAL EXAMINATION:  Her motor strength is 4/5 in the right deltoid, biceps, and grip; 5/5 on the left side.  In the right lower extremity, she has 4/5 knee extensor strength as well as hip flexor strength. Ankle dorsiflexor not tested secondary to cast boot.  She is able to wiggles her toes; however, 5 in the left lower extremity.  Speech has mild word-finding deficits, otherwise normal.  That is no evidence of facial droop.  IMPRESSION: 1. Left MCA distribution infarct with residual mild right hemiparesis.     She still has some mild word-finding deficits but overall much     improved compared to prior. 2. Right ankle bimalleolar fracture.  She will followup with     Orthopedics, hopefully Cam boot will be off.  She will resume some     physical therapy after the Cam boot is off and work on her stroke     related therapy as well as ankle range of motion and strengthening.  Discussed with patient and daughter, agree with plan.  I will see her back in about 6 to 8 weeks.  We will address driving at that time.  No driving until I see her back.     Erick Colace, M.D. Electronically Signed    AEK/MedQ D:   05/02/2011 15:33:12  T:  05/03/2011 03:29:44  Job #:  [id number]  cc:   Leonides Grills, M.D. Fax: [phone]  Rosalyn Gess. Norins, MD [address] [place] [address]  Doylene Canning. Ladona Ridgel, MD [address]  [address] [place] [address]

## 2011-05-07 ENCOUNTER — Inpatient Hospital Stay (HOSPITAL_COMMUNITY)
Admission: EM | Admit: 2011-05-07 | Discharge: 2011-05-08 | DRG: 312 | Disposition: A | Discharge status: Home / Self Care | Payer: Medicare Other | Attending: Cardiovascular Disease | Admitting: Cardiovascular Disease

## 2011-05-07 ENCOUNTER — Emergency Department (HOSPITAL_COMMUNITY): Payer: Medicare Other

## 2011-05-07 DIAGNOSIS — Z8673 Personal history of transient ischemic attack (TIA), and cerebral infarction without residual deficits: Secondary | ICD-10-CM

## 2011-05-07 DIAGNOSIS — Z79899 Other long term (current) drug therapy: Secondary | ICD-10-CM

## 2011-05-07 DIAGNOSIS — I4891 Unspecified atrial fibrillation: Secondary | ICD-10-CM | POA: Diagnosis present

## 2011-05-07 DIAGNOSIS — R55 Syncope and collapse: Secondary | ICD-10-CM

## 2011-05-07 DIAGNOSIS — E785 Hyperlipidemia, unspecified: Secondary | ICD-10-CM | POA: Diagnosis present

## 2011-05-07 DIAGNOSIS — I10 Essential (primary) hypertension: Secondary | ICD-10-CM | POA: Diagnosis present

## 2011-05-07 DIAGNOSIS — I498 Other specified cardiac arrhythmias: Secondary | ICD-10-CM | POA: Diagnosis present

## 2011-05-07 DIAGNOSIS — I951 Orthostatic hypotension: Principal | ICD-10-CM | POA: Diagnosis present

## 2011-05-07 DIAGNOSIS — I440 Atrioventricular block, first degree: Secondary | ICD-10-CM | POA: Diagnosis present

## 2011-05-07 LAB — CK TOTAL AND CKMB (NOT AT ARMC)
CK, MB: 3.2 ng/mL (ref 0.3–4.0)
Relative Index: INVALID (ref 0.0–2.5)
Total CK: 66 U/L (ref 7–177)

## 2011-05-07 LAB — COMPREHENSIVE METABOLIC PANEL
ALT: 30 U/L (ref 0–35)
AST: 29 U/L (ref 0–37)
Albumin: 4 g/dL (ref 3.5–5.2)
Alkaline Phosphatase: 47 U/L (ref 39–117)
BUN: 19 mg/dL (ref 6–23)
CO2: 24 mEq/L (ref 19–32)
Calcium: 10.5 mg/dL (ref 8.4–10.5)
Chloride: 100 mEq/L (ref 96–112)
Creatinine, Ser: 1.37 mg/dL — ABNORMAL HIGH (ref 0.4–1.2)
GFR calc Af Amer: 45 mL/min — ABNORMAL LOW (ref >60)
GFR calc non Af Amer: 37 mL/min — ABNORMAL LOW (ref >60)
Glucose, Bld: 120 mg/dL — ABNORMAL HIGH (ref 70–99)
Potassium: 3.6 mEq/L (ref 3.5–5.1)
Sodium: 138 mEq/L (ref 135–145)
Total Bilirubin: 0.3 mg/dL (ref 0.3–1.2)
Total Protein: 7.2 g/dL (ref 6.0–8.3)

## 2011-05-07 LAB — PROTIME-INR
INR: 2.69 — ABNORMAL HIGH (ref 0.00–1.49)
Prothrombin Time: 28.7 seconds — ABNORMAL HIGH (ref 11.6–15.2)

## 2011-05-07 LAB — DIFFERENTIAL
Basophils Absolute: 0 10*3/uL (ref 0.0–0.1)
Basophils Relative: 0 % (ref 0–1)
Eosinophils Absolute: 0 10*3/uL (ref 0.0–0.7)
Eosinophils Relative: 0 % (ref 0–5)
Lymphocytes Relative: 14 % (ref 12–46)
Lymphs Abs: 1.5 10*3/uL (ref 0.7–4.0)
Monocytes Absolute: 0.8 10*3/uL (ref 0.1–1.0)
Monocytes Relative: 7 % (ref 3–12)
Neutro Abs: 8.4 10*3/uL — ABNORMAL HIGH (ref 1.7–7.7)
Neutrophils Relative %: 78 % — ABNORMAL HIGH (ref 43–77)

## 2011-05-07 LAB — URINALYSIS, ROUTINE W REFLEX MICROSCOPIC
Bilirubin Urine: NEGATIVE
Glucose, UA: NEGATIVE mg/dL
Ketones, ur: 15 mg/dL — AB
Leukocytes, UA: NEGATIVE
Nitrite: NEGATIVE
Protein, ur: 30 mg/dL — AB
Specific Gravity, Urine: 1.013 (ref 1.005–1.030)
Urobilinogen, UA: 0.2 mg/dL (ref 0.0–1.0)
pH: 6.5 (ref 5.0–8.0)

## 2011-05-07 LAB — CBC
HCT: 42.6 % (ref 36.0–46.0)
Hemoglobin: 14.8 g/dL (ref 12.0–15.0)
MCH: 29.2 pg (ref 26.0–34.0)
MCHC: 34.7 g/dL (ref 30.0–36.0)
MCV: 84.2 fL (ref 78.0–100.0)
Platelets: 255 10*3/uL (ref 150–400)
RBC: 5.06 MIL/uL (ref 3.87–5.11)
RDW: 14.8 % (ref 11.5–15.5)
WBC: 10.8 10*3/uL — ABNORMAL HIGH (ref 4.0–10.5)

## 2011-05-07 LAB — TROPONIN I: Troponin I: <0.3 ng/mL (ref <0.30)

## 2011-05-07 LAB — URINE MICROSCOPIC-ADD ON

## 2011-05-07 LAB — D-DIMER, QUANTITATIVE: D-Dimer, Quant: <0.22 ug/mL-FEU (ref 0.00–0.48)

## 2011-05-08 ENCOUNTER — Encounter: Payer: Self-pay | Admitting: *Deleted

## 2011-05-08 DIAGNOSIS — I517 Cardiomegaly: Secondary | ICD-10-CM

## 2011-05-08 LAB — BASIC METABOLIC PANEL
BUN: 16 mg/dL (ref 6–23)
CO2: 23 mEq/L (ref 19–32)
Calcium: 10.2 mg/dL (ref 8.4–10.5)
Chloride: 101 mEq/L (ref 96–112)
Creatinine, Ser: 1.18 mg/dL (ref 0.4–1.2)
GFR calc Af Amer: 53 mL/min — ABNORMAL LOW (ref >60)
GFR calc non Af Amer: 44 mL/min — ABNORMAL LOW (ref >60)
Glucose, Bld: 84 mg/dL (ref 70–99)
Potassium: 3.3 mEq/L — ABNORMAL LOW (ref 3.5–5.1)
Sodium: 137 mEq/L (ref 135–145)

## 2011-05-08 LAB — CARDIAC PANEL(CRET KIN+CKTOT+MB+TROPI)
CK, MB: 3.5 ng/mL (ref 0.3–4.0)
Relative Index: INVALID (ref 0.0–2.5)
Total CK: 45 U/L (ref 7–177)
Troponin I: <0.3 ng/mL (ref <0.30)

## 2011-05-08 LAB — TSH: TSH: 1.162 u[IU]/mL (ref 0.350–4.500)

## 2011-05-11 ENCOUNTER — Telehealth: Payer: Self-pay | Admitting: *Deleted

## 2011-05-11 MED ORDER — VALSARTAN 160 MG PO TABS: 160.0000 mg | ORAL_TABLET | Freq: Every day | ORAL | Status: DC

## 2011-05-11 NOTE — Telephone Encounter (Signed)
Per phone call from patients daughter Dr Ladona Ridgel stopped the HCTZ part of the Diovan  So she needed the new rx for plain Diovan called into [facility]  Done daughter aware

## 2011-05-15 ENCOUNTER — Ambulatory Visit: Payer: Medicare Other | Admitting: Physical Therapy

## 2011-05-15 ENCOUNTER — Other Ambulatory Visit: Payer: Medicare Other | Admitting: *Deleted

## 2011-05-15 ENCOUNTER — Other Ambulatory Visit (INDEPENDENT_AMBULATORY_CARE_PROVIDER_SITE_OTHER): Payer: Medicare Other

## 2011-05-15 DIAGNOSIS — I6350 Cerebral infarction due to unspecified occlusion or stenosis of unspecified cerebral artery: Secondary | ICD-10-CM

## 2011-05-15 DIAGNOSIS — Z79899 Other long term (current) drug therapy: Secondary | ICD-10-CM

## 2011-05-15 DIAGNOSIS — I4892 Unspecified atrial flutter: Secondary | ICD-10-CM

## 2011-05-15 LAB — CBC WITH DIFFERENTIAL/PLATELET
Basophils Absolute: 0 10*3/uL (ref 0.0–0.1)
Basophils Relative: 0.5 % (ref 0.0–3.0)
Eosinophils Absolute: 0 10*3/uL (ref 0.0–0.7)
Eosinophils Relative: 0.6 % (ref 0.0–5.0)
HCT: 39.4 % (ref 36.0–46.0)
Hemoglobin: 13.4 g/dL (ref 12.0–15.0)
Lymphocytes Relative: 17.5 % (ref 12.0–46.0)
Lymphs Abs: 1.4 10*3/uL (ref 0.7–4.0)
MCHC: 34.1 g/dL (ref 30.0–36.0)
MCV: 88.4 fl (ref 78.0–100.0)
Monocytes Absolute: 0.6 10*3/uL (ref 0.1–1.0)
Monocytes Relative: 7.2 % (ref 3.0–12.0)
Neutro Abs: 5.8 10*3/uL (ref 1.4–7.7)
Neutrophils Relative %: 74.2 % (ref 43.0–77.0)
Platelets: 220 10*3/uL (ref 150.0–400.0)
RBC: 4.46 Mil/uL (ref 3.87–5.11)
RDW: 15.8 % — ABNORMAL HIGH (ref 11.5–14.6)
WBC: 7.9 10*3/uL (ref 4.5–10.5)

## 2011-05-15 LAB — BASIC METABOLIC PANEL
BUN: 20 mg/dL (ref 6–23)
CO2: 28 mEq/L (ref 19–32)
Calcium: 10.3 mg/dL (ref 8.4–10.5)
Chloride: 102 mEq/L (ref 96–112)
Creatinine, Ser: 1.5 mg/dL — ABNORMAL HIGH (ref 0.4–1.2)
GFR: 35.42 mL/min — ABNORMAL LOW (ref >60.00)
Glucose, Bld: 91 mg/dL (ref 70–99)
Potassium: 4.3 mEq/L (ref 3.5–5.1)
Sodium: 139 mEq/L (ref 135–145)

## 2011-05-15 LAB — PROTIME-INR
INR: 3.8 ratio — ABNORMAL HIGH (ref 0.8–1.0)
Prothrombin Time: 37 s — ABNORMAL HIGH (ref 10.2–12.4)

## 2011-05-15 LAB — APTT: aPTT: 48.5 s — ABNORMAL HIGH (ref 21.7–28.8)

## 2011-05-18 ENCOUNTER — Ambulatory Visit: Payer: Medicare Other | Admitting: Physical Therapy

## 2011-05-18 NOTE — Discharge Summary (Addendum)
[name], [name]  [name]  LOCATION:  3715                         FACILITY:  [facility]  PHYSICIAN:  Doylene Canning. Ladona Ridgel, MD    DATE OF BIRTH:  [birth year]  DATE OF ADMISSION:  05/07/2011 DATE OF DISCHARGE:  05/08/2011                              DISCHARGE SUMMARY   DISCHARGE DIAGNOSES: 1. Syncope.     a.     Documented orthostasis this admission, hydrochlorothiazide      component of Diovan HCT discontinued. 2. Supraventricular tachycardia with history of paroxysmal atrial     fibrillation, for EP study possible ablation and possible pacemaker     on May 22, 2011. 3. History of normal left ventricular function, 2-D echo read pending     at this time. 4. History of stroke. 5. Hypertension.  HOSPITAL COURSE:  [name] is an 77 year old female, well-known to Dr. Ladona Ridgel with a history of SVT and PAF, who presented to the [facility] with an episode of syncope.  She experienced on day of admission while in church, presented with initial blood pressure 181/111 with a corresponding pulse of 141.  A 12-lead EKG was obtained, indicating narrow complex tachycardia approximately 140 beats per minute.  She was treated with 5 mg of IV Lopressor, which subsequently broke her rhythm to normal sinus rhythm with a first-degree AV block. Unfortunately, there are no strips to document the brake from the SVT. Upon evaluation in the Cardiology, she was maintained sinus bradycardia in the 50 beats per minute range.  She was also assessed for possible orthostasis, and had a noted drop in pressure of 126 sitting, to 97 standing and had received a 1 liter of normal saline.  HCTZ was discontinued.  She was evaluated by Dr. Ladona Ridgel today, and was felt that she should undergo EP study/ablation plus or minus pacemaker implantation which has been ranged for May 22, 2011.  Dr. Ladona Ridgel has seen and examined her today and feels she is  stable for discharge.  DISCHARGE LABS:  WBC 10.8, hemoglobin 14.8, hematocrit 42.6, and platelet 255.  D-dimer less than 0.22.  Cardiac enzymes negative x1. Sodium 138, potassium 3.6, chloride 100, CO2 is 24, glucose 120, BUN 19, creatinine 1.67.  LFTs were normal.  STUDIES:  Chest x-ray on May 07, 2011, showed no active bone disease. Stable mild cardiomegaly.  DISCHARGE MEDICATIONS: 1. Diovan 160 mg daily. 2. Acetaminophen 325 mg 1-2 tabs q.4 h. p.r.n. pain. 3. Amiodarone 200 mg b.i.d. 4. Fish oil 1 g 1-2 capsules b.i.d., taking 2 capsules in the morning     and 1 capsule at night. 5. Folbic OTC 1 tablet daily. 6. Polyethylene glycol 17 g daily by mouth as needed. 7. Rivaroxaban 15 mg daily with instructions not to take on May 21, 2011. 8. Simvastatin 10 mg at bedtime. 9. Vitamin D2 50,000 units weekly.  Please note that the HCTZ component of her Diovan HCT was discontinued, so she was given a prescription just for this Diovan component.  DISPOSITION:  [name] will be discharged in stable condition to home. She is to increase her activity slowly and follow a  heart-healthy diet. She is to come to short stay at [facility] on 6 a.m. the day of her procedure, which is Monday on May 22, 2011.  Her ablation/possible pacemaker is scheduled at 7:30 a.m.  She is to come to our [place] office on Monday, May 15, 2011 at the [employer] office for lab work.  She is not to eat or drink anything after midnight for her procedure and not to take her Xarelto on May 21, 2011.  DURATION OF DISCHARGE ENCOUNTER:  Greater than 30 minutes including physician and PA time.     Dayna Dunn, P.A.C.   ______________________________ Doylene Canning. Ladona Ridgel, MD    DD/MEDQ  D:  05/08/2011  T:  05/08/2011  Job:  [id number]  Electronically Signed by Ronie Spies  on 05/18/2011 09:25:34 AM Electronically Signed by Lewayne Bunting MD on 06/22/2011 08:31:19 AM

## 2011-05-22 ENCOUNTER — Ambulatory Visit: Payer: Medicare Other | Admitting: Physical Therapy

## 2011-05-22 ENCOUNTER — Ambulatory Visit (HOSPITAL_COMMUNITY)
Admission: RE | Admit: 2011-05-22 | Discharge: 2011-05-23 | Disposition: A | Discharge status: Home / Self Care | Payer: Medicare Other | Source: Ambulatory Visit | Attending: Internal Medicine | Admitting: Internal Medicine

## 2011-05-22 DIAGNOSIS — I10 Essential (primary) hypertension: Secondary | ICD-10-CM | POA: Insufficient documentation

## 2011-05-22 DIAGNOSIS — I471 Supraventricular tachycardia: Secondary | ICD-10-CM

## 2011-05-22 DIAGNOSIS — I498 Other specified cardiac arrhythmias: Secondary | ICD-10-CM | POA: Insufficient documentation

## 2011-05-22 DIAGNOSIS — Z79899 Other long term (current) drug therapy: Secondary | ICD-10-CM | POA: Insufficient documentation

## 2011-05-22 DIAGNOSIS — R55 Syncope and collapse: Secondary | ICD-10-CM | POA: Insufficient documentation

## 2011-05-22 DIAGNOSIS — Z8673 Personal history of transient ischemic attack (TIA), and cerebral infarction without residual deficits: Secondary | ICD-10-CM | POA: Insufficient documentation

## 2011-05-22 LAB — SURGICAL PCR SCREEN
MRSA, PCR: NEGATIVE
Staphylococcus aureus: NEGATIVE

## 2011-05-22 LAB — PROTIME-INR
INR: 1.07 (ref 0.00–1.49)
Prothrombin Time: 14.1 seconds (ref 11.6–15.2)

## 2011-05-24 ENCOUNTER — Ambulatory Visit: Payer: Medicare Other | Admitting: Physical Therapy

## 2011-05-25 NOTE — H&P (Signed)
NAMESHEILAH, [name] NO.:  [id number]  MEDICAL RECORD NO.:  [id number]           PATIENT TYPE:  I  LOCATION:  3715                         FACILITY:  [facility]  PHYSICIAN:  Vesta Mixer, M.D. DATE OF BIRTH:  February 17, 1928  DATE OF ADMISSION:  05/07/2011 DATE OF DISCHARGE:                             HISTORY & PHYSICAL   PRIMARY CARDIOLOGIST:  Doylene Canning. Ladona Ridgel, MD  REASON FOR ADMISSION:  [name] is an 77 year old female, with a history of supraventricular tachycardia, and no known history of coronary artery disease.  She presented to the emergency room, status post syncopal episode, which occurred earlier today while in church. She presented with an initial blood pressure of 181/111, with a corresponding pulse of 141.  A 12-lead electrocardiogram was obtained, indicating narrow complex tachycardia at approximately 140 bpm.  The patient was treated with 5 mg of IV Lopressor, which subsequently broke her rhythm to normal sinus rhythm with a first-degree AV block. Unfortunately, there are no strips to document the break from the SVT rhythm.  She is currently maintaining sinus bradycardia in the 50 bpm range.  The patient also was assessed for possible orthostasis, and had a noted drop in systolic pressure of 126 sitting, to 97 standing.  She has received a total of 1 liter normal saline, and is currently hemodynamically stable.  According to the patient's daughter, this is the first time that the patient has actually passed out.  There apparently was no sensation of tachy palpitations prior to the episode, and the patient just began feeling ill.  The episode did occur at church earlier this morning.  ALLERGIES:  CODEINE.  HOME MEDICATIONS: 1. Amiodarone 200 mg b.i.d. 2. Diovan/HCT 160/12.5 mg daily. 3. Fish oil 1 g b.i.d. 4. MiraLax 17 mg daily. 5. Xarelto 15 mg daily. 6. Simvastatin 10 mg nightly.  PAST MEDICAL HISTORY: 1. Supraventricular  tachycardia.     a.     History of paroxysmal atrial fibrillation. 2. Normal left ventricular function. 3. Status post left MCA stroke, March 2012.     a.     Documented atrial fibrillation.     b.     EF 70% by 2-D echo, with no obvious thrombus. 4. Hypertension. 5. Hyperlipidemia. 6. History of seizure. 7. Status post right ankle fracture.  SOCIAL HISTORY:  The patient has never smoked tobacco.  Denies alcohol use.  FAMILY HISTORY:  Negative for premature coronary artery disease.  REVIEW OF SYSTEMS:  Denies any recent development of fever, chills, or productive cough.  Denies exertional chest discomfort or significant exertional dyspnea.  Denies tachy palpitations.  Remaining systems reviewed, and are negative.  PHYSICAL EXAMINATION:  VITAL SIGNS:  Blood pressure currently 134/85; pulse 50s, regular; respirations 18; temperature afebrile; sats 99% on 2 liters. GENERAL:  An 77 year old female, lying supine, no distress. HEENT:  Normocephalic, atraumatic.  PERRLA.  EOMI. NECK:  Palpable bilateral carotid pulse without bruits; no JVD at 30 degrees. LUNGS:  Clear to auscultation bilaterally. HEART:  Regular rate and rhythm.  No significant murmurs.  No rubs or gallops. ABDOMEN:  Soft, nontender, intact bowel sounds. EXTREMITIES:  No peripheral edema.  Right foot boot. SKIN:  Warm and dry. MUSCULOSKELETAL:  No obvious deformity. NEURO:  Alert and oriented.  Admission chest x-ray:  Stable mild cardiomegaly; no acute disease.  LABORATORY DATA:  CPK 66/3.2, troponin I less than 0.3.  INR 2.7, D- dimer less than 0.22.  Sodium 138, potassium 3.6, BUN 19, creatinine 1.4, and glucose 120.  WBC 10.8, hemoglobin 14.8, hematocrit 43, and platelet 255.  Albumin 4.0, with normal LFTs.  Admission EKG:  SVT at approximately 140 bpm; LAD; ST-segment depression in leads V4-V6; I.  Followup EKG reveals NSR at 67 bpm with first-degree AV block; nonspecific ST changes.  IMPRESSION: 1.  Syncope. 2. Supraventricular tachycardia, recurrent.     a.     Successful conversion to normal sinus rhythm, following      treatment with IV Lopressor. 3. Orthostatic hypotension. 4. History of normal ventricular function. 5. History of stroke. 6. Hypertension.  PLAN:  The patient will be admitted to telemetry unit for continued close monitoring on continuous telemetry.  We will continue current home medication regimen, with the exception of hydrochlorothiazide, in light of noted hypotension.  We will continue Diovan component.  We will defer to Dr. Lewayne Bunting in the a.m., regarding medication adjustments, as well  as therapeutic options including possible SVT ablation and/or permanent  pacemaker implantation.  A 2-D echo will be ordered for the a.m., for  reassessment of left ventricular function.  We will also repeat a set of  orthostatic blood pressures.     [name], PA-C   ______________________________ Vesta Mixer, M.D.    GS/MEDQ  D:  05/07/2011  T:  05/08/2011  Job:  [id number]  Electronically Signed by Rozell Searing PA-C on 05/09/2011 12:16:43 PM Electronically Signed by Kristeen Miss M.D. on 05/25/2011 09:15:19 AM

## 2011-05-26 ENCOUNTER — Telehealth: Payer: Self-pay | Admitting: Internal Medicine

## 2011-05-26 NOTE — Telephone Encounter (Signed)
pt had ablation on 6-18 was to take it easy for 72 hrs now dtr requesting an order to continue PT  fax [phone] att dana

## 2011-05-29 ENCOUNTER — Ambulatory Visit: Payer: Medicare Other | Admitting: Physical Therapy

## 2011-05-29 NOTE — Telephone Encounter (Signed)
Returned Dana's call okay for pt to return to rehab

## 2011-05-29 NOTE — Telephone Encounter (Signed)
Per dana from physical therapy. Is it O.K. For pt to resume physical therapy today. Also include any restriction the patient may have. Fax [phone].

## 2011-05-31 ENCOUNTER — Ambulatory Visit: Payer: Medicare Other | Attending: Physical Medicine & Rehabilitation | Admitting: Physical Therapy

## 2011-05-31 DIAGNOSIS — I69998 Other sequelae following unspecified cerebrovascular disease: Secondary | ICD-10-CM | POA: Insufficient documentation

## 2011-05-31 DIAGNOSIS — I69919 Unspecified symptoms and signs involving cognitive functions following unspecified cerebrovascular disease: Secondary | ICD-10-CM | POA: Insufficient documentation

## 2011-05-31 DIAGNOSIS — Z5189 Encounter for other specified aftercare: Secondary | ICD-10-CM | POA: Insufficient documentation

## 2011-05-31 DIAGNOSIS — R269 Unspecified abnormalities of gait and mobility: Secondary | ICD-10-CM | POA: Insufficient documentation

## 2011-05-31 DIAGNOSIS — M6281 Muscle weakness (generalized): Secondary | ICD-10-CM | POA: Insufficient documentation

## 2011-05-31 DIAGNOSIS — R279 Unspecified lack of coordination: Secondary | ICD-10-CM | POA: Insufficient documentation

## 2011-06-06 NOTE — Discharge Summary (Signed)
[name], [name] NO.:  [id number]  MEDICAL RECORD NO.:  [id number]  LOCATION:  2036                         FACILITY:  [facility]  PHYSICIAN:  Duke Salvia, MD, FACCDATE OF BIRTH:  Mar 16, 1928  DATE OF ADMISSION:  05/22/2011 DATE OF DISCHARGE:  05/23/2011                              DISCHARGE SUMMARY   PRIMARY CARE PHYSICIAN:  Rosalyn Gess. Norins, MD  ELECTROPHYSIOLOGIST:  Doylene Canning. Ladona Ridgel, MD  PRIMARY DIAGNOSIS:  Supraventricular tachycardia.  SECONDARY DIAGNOSES: 1. Syncope with documented orthostasis in the past. 2. History of stroke. 3. Hypertension.  ALLERGIES:  The patient is allergic to CODEINE and BEE POLLEN.  PROCEDURES THIS ADMISSION:  Electrophysiology study and RF catheter ablation of AV node re-entry tachycardia as well as atrial tachycardia originating in the right atrial septum on May 22, 2011, by Dr. Ladona Ridgel. The patient also had at study, a question of sinus node tachycardia versus a sinus tachycardia.  The patient had no early apparent complications.  BRIEF HISTORY OF PRESENT ILLNESS:  [name] is an 77 year old female with a history of, but not documented paroxysmal atrial fibrillation who also has supraventricular tachycardia.  She has also had recurrent syncope.  She was evaluated by Dr. Ladona Ridgel for treatment options.  Risks, benefits, and alternatives of EP study were discussed with the patient and she wished to proceed.  HOSPITAL COURSE:  The patient was admitted on May 22, 2011, for planned ablation of SVT.  This was carried out by Dr. Ladona Ridgel with details as outlined above.  She was monitored on telemetry overnight which demonstrated sinus rhythm and sinus bradycardia.  EKG was obtained which demonstrated sinus brady with a rate of 53 and intervals of 0.1/0.10/0.49.  The patient's groin and neck incisions were without hematoma or bruits.  Per Dr. Ladona Ridgel, recommendations were decreased to amiodarone to 200 mg once daily and  continue her other home meds as previously ordered.  Dr. Graciela Husbands examined the patient on May 23, 2011, and considered stable for discharge.  DISCHARGE INSTRUCTIONS: 1. Increase activity slowly. 2. No driving for 2 days. 3. Follow a low-sodium, heart-healthy diet. 4. Keep incision clean and dry.  FOLLOWUP APPOINTMENTS: 1. Dr. Ladona Ridgel on July 10, 2011, at 3:15 p.m. 2. Dr. Arthur Holms, as scheduled.  DISCHARGE MEDICATIONS: 1. Amiodarone 200 mg 1 tablet daily - this is a decreased dose for the     patient. 2. Tylenol 325 mg 1-2 tablets every 4 hours as needed. 3. Diovan 160 mg daily. 4. Fish oil 1 g take 2 capsules in the morning and one capsule in the     evening. 5. Folbic over-the-counter one tablet daily. 6. MiraLax 17 g by mouth daily as needed for constipation. 7. Xarelto 15 mg daily. 8. Simvastatin 10 mg daily. 9. Vitamin D one capsule weekly.  DISPOSITION:  The patient was seen and examined by Dr. Graciela Husbands on May 23, 2011, and considered stable for discharge.  DURATION OF DISCHARGE ENCOUNTER:  Thirty five minutes.     Gypsy Balsam, RN,BSN   ______________________________ Duke Salvia, MD, [facility] [address]    AS/MEDQ  D:  05/23/2011  T:  05/23/2011  Job:  [id number]  cc:   Rosalyn Gess.  Norins, MD  Electronically Signed by Gypsy Balsam RNBSN on 05/29/2011 02:01:59 PM Electronically Signed by Sherryl Manges MD [facility] on 06/06/2011 04:33:28 PM

## 2011-06-12 ENCOUNTER — Ambulatory Visit: Payer: Medicare Other | Attending: Physical Medicine & Rehabilitation | Admitting: Physical Therapy

## 2011-06-12 DIAGNOSIS — M6281 Muscle weakness (generalized): Secondary | ICD-10-CM | POA: Insufficient documentation

## 2011-06-12 DIAGNOSIS — R269 Unspecified abnormalities of gait and mobility: Secondary | ICD-10-CM | POA: Insufficient documentation

## 2011-06-12 DIAGNOSIS — Z5189 Encounter for other specified aftercare: Secondary | ICD-10-CM | POA: Insufficient documentation

## 2011-06-12 DIAGNOSIS — I69998 Other sequelae following unspecified cerebrovascular disease: Secondary | ICD-10-CM | POA: Insufficient documentation

## 2011-06-12 DIAGNOSIS — I69919 Unspecified symptoms and signs involving cognitive functions following unspecified cerebrovascular disease: Secondary | ICD-10-CM | POA: Insufficient documentation

## 2011-06-12 DIAGNOSIS — R279 Unspecified lack of coordination: Secondary | ICD-10-CM | POA: Insufficient documentation

## 2011-06-15 ENCOUNTER — Ambulatory Visit: Payer: Medicare Other | Admitting: Physical Therapy

## 2011-06-19 ENCOUNTER — Ambulatory Visit: Payer: Medicare Other | Admitting: Physical Therapy

## 2011-06-22 ENCOUNTER — Ambulatory Visit: Payer: Medicare Other | Admitting: Physical Therapy

## 2011-06-22 NOTE — Op Note (Signed)
NAMEGRIFFIN, [name]  MEDICAL RECORD NO.:  [id number]  LOCATION:  2036                         FACILITY:  [facility]  PHYSICIAN:  Doylene Canning. Ladona Ridgel, MD    DATE OF BIRTH:  January 23, 1928  DATE OF PROCEDURE:  05/22/2011 DATE OF DISCHARGE:                              OPERATIVE REPORT   PROCEDURE PERFORMED:  Electrophysiologic study and RF catheter ablation of AV node reentry tachycardia as well as atrial tachycardia originating from the right atrial septum.  There was a question of sinus node reentry versus sinus tachycardia as well.  DESCRIPTION OF PROCEDURE:  After informed consent was obtained, the patient was taken to the Diagnostic EP Lab in the fasting state.  After usual preparation and draping, intravenous fentanyl and midazolam were given for sedation.  A 6-French hexapolar catheter was inserted percutaneously in the right jugular vein and advanced to the coronary sinus.  A 6-French quadripolar catheter was inserted percutaneously in the right femoral vein and advanced to the right ventricle.  A 6-French quadripolar catheter was inserted percutaneously in the right femoral vein and advanced to the His bundle region.  Rapid ventricular pacing was carried out and stepwise decreased down to 600 milliseconds where VA dissociation was demonstrated.  Programmed ventricular stimulation was carried out at 600 milliseconds with also demonstrating VA dissociation. Programmed stimulation from the coronary sinus was carried out at a base drive cycle length of 045 milliseconds.  the S1 and S2 interval was stepwise decreased down to 340 milliseconds and the atrial refractoriness was observed.  During programmed atrial stimulation, there was an AH jump with no echo beats.  The PR interval was greater than the RR interval.  Next, rapid atrial pacing was carried out from the coronary sinus and stepwise decreased down to 440 milliseconds where AV Wenckebach was  observed.  During rapid ventricular pacing, the PR interval was again greater than the RR interval, but there was initially no inducible SVT.  Next, isoproterenol was infused at rates between 1 and 4 mcg/minute.  Additional rapid atrial pacing was then carried out, resulting in the initiation of SVT.  This was a short RP tachycardia at a cycle length of 380 milliseconds.  PVCs were placed at the time of His bundle refractoriness, demonstrated no pre-excitation.  Ventricular pacing during tachycardia demonstrated VA-AV conduction sequence. Tachycardia could be pace terminated with rapid atrial pacing.  There appeared to be midline atrial activation.  At this point, a diagnosis of AV node reentry tachycardia was made.  Isoproterenol was discontinued and a 7-French quadripolar ablation catheter was maneuvered into the right atrium by way of the right femoral vein.  Mapping was carried out in [place] triangle which is of usual size and orientation.  Three RF energy applications were delivered in [place] triangle site, 7 and 8, resulting in a prolonged accelerated junctional rhythm.  Following this, rapid atrial pacing demonstrated a PR interval of less than the RR interval and there was no inducible SVT.  Next, isoproterenol was reinitiated.  Technical problems have developed in our mapping system, resulting in requiring its rebooting.  During isoproterenol with the system down, the patient developed a fairly rapid onset of  a new tachycardia at 145 beats per minute.  Initially, I thought this was recurrence of his AVNRT as the activation sequence appeared to be similar.  However, mapping demonstrated that the earliest activation was in the area of this sinus node.  Rapid ventricular pacing demonstrated VA-AV conduction sequence, clearly different from the previous tachycardia.  This tachycardia was sustained.  Mapping was carried out and several RF energy applications were delivered.  It had no  effect on the tachycardia.  At this point, a 20-pole Halo catheter was then inserted into the right atrium and additional mapping carried out.  This demonstrated activation again in the high right atrium near the region of the sinus node.  The atrial signals were somewhat abbreviated and fractionated, indicative of an arteriopathy.  At this point, the question was as to whether this was in fact sinus tachycardia and isoproterenol was discontinued which resulted in an immediate slowing of the atrial rate.  This was thought to be suggested that the second tachycardia was in fact sinus tachycardia.  As isoproterenol was weaned off, additional tachycardia developed at a rate of 110 beats per minute. Earliest atrial activation of this tachycardia was on the atrial septum and mapping was subsequently carried out.  At an area approximately 0.5 cm cephalad to the AV node was marked the earliest atrial activation of this third tachycardia.  A single RF energy application was delivered to this area and it terminated immediately.  At this point, additional pacing was carried out, demonstrating no sustained tachycardias and the catheters were removed.  Hemostasis was assured and the patient was returned to her room in satisfactory condition.  COMPLICATIONS:  There were no immediate procedure complications.  RESULTS:  A.  Baseline ECG.  Baseline ECG demonstrates sinus rhythm with normal axis and intervals. B.  Baseline intervals.  The sinus node cycle length was 886 milliseconds.  The HV interval was 45 milliseconds.  The AH interval was 98 milliseconds.  The QRS duration was 105 milliseconds. C.  Rapid ventricular pacing.  Rapid ventricular pacing at baseline demonstrated VA dissociation at 600 milliseconds. D.  Programmed ventricular stimulation.  Programmed ventricular stimulation was carried out from the right ventricle, demonstrating VA dissociation at 600 milliseconds. E.  Rapid atrial  pacing.  Rapid atrial pacing was carried out from the coronary sinus and the right atrium at base drive cycle length of 914 milliseconds and stepwise decreased down to 440 milliseconds where AV Wenckebach was observed.  During rapid atrial pacing, the PR interval was initially greater than the RR interval.  Following ablation, the PR interval was less than the RR interval and there was no inducible SVT. F.  Programmed atrial stimulation.  Programmed atrial stimulation was carried out from the coronary sinus in the right atrium.  Base drive cycle length of 782 milliseconds.  The S1 and S2 interval was stepwise decreased down to 340 milliseconds with the AV node ERP was observed. During programmed atrial stimulation, there were multiple AH jumps but no echo beats.  Following ablation, there were no more AH jumps and no echo beats present. G.  Arrhythmias observed. 1. AV node reentry tachycardia initiation with rapid atrial pacing on     isoproterenol.  The duration was sustained.  Termination was with     rapid atrial pacing.  Cycle length was 380 milliseconds. 2. Probable sinus tachycardia initiation with isoproterenol and     spontaneous.  Duration was sustained.  Termination was with     discontinuation of  isoproterenol. 3. Atrial tachycardia initiation was with spontaneous.  Duration was     sustained.  Cycle length was 580 milliseconds and method of     termination was with catheter ablation.     a.     RF energy application.  RF energy application was delivered      to the slow pathway region at [facility] triangle as well as to the      region of the high right atrium near the sinus node as well as to      the atrial septum just cephalad of the patient's AV node.      Following RF energy application, there was no additional inducible      tachycardia.  CONCLUSION:  This study demonstrates successful electrophysiologic study and catheter ablation of AV node reentry tachycardia, also  demonstrates apparent successful catheter ablation of atrial tachycardia.  With multiple fractionated signals, the patient's arteriopathy suggest that she may well have more trouble with her atrial arrhythmias, these will be monitored carefully.     Doylene Canning. Ladona Ridgel, MD     GWT/MEDQ  D:  05/22/2011  T:  05/23/2011  Job:  [id number]  Electronically Signed by Lewayne Bunting MD on 06/22/2011 08:31:23 AM

## 2011-06-26 ENCOUNTER — Telehealth: Payer: Self-pay | Admitting: Internal Medicine

## 2011-06-26 ENCOUNTER — Encounter: Payer: Self-pay | Admitting: *Deleted

## 2011-06-26 ENCOUNTER — Ambulatory Visit (INDEPENDENT_AMBULATORY_CARE_PROVIDER_SITE_OTHER): Payer: Medicare Other | Admitting: Endocrinology

## 2011-06-26 ENCOUNTER — Ambulatory Visit: Payer: Medicare Other | Admitting: Physical Therapy

## 2011-06-26 VITALS — BP 154/82 | HR 72 | Temp 97.9°F | Ht 62.0 in | Wt 136.4 lb

## 2011-06-26 DIAGNOSIS — I10 Essential (primary) hypertension: Secondary | ICD-10-CM

## 2011-06-26 MED ORDER — VALSARTAN 160 MG PO TABS: 160.0000 mg | ORAL_TABLET | Freq: Two times a day (BID) | ORAL | Status: DC

## 2011-06-26 NOTE — Progress Notes (Signed)
Subjective:    Patient ID: [name], female    DOB: January 02, 1928, 77 y.o.   MRN: [id number]  HPI Pt had cva 4 mos ago.  bp at home is approx 160/80.  Sometimes systolic is 130.  However, systolic in the evening was 200 in the past few days.  Lat night, it was 230.  Pt says she was on diovan-hct, but dr Elease Hashimoto stopped the hctz component She has few days of slight bleeding from the right nare, and assoc nasal congestion.   Past Medical History  Diagnosis Date  . HTN (hypertension)   . History of colonic polyps   . Supraventricular tachycardia   . Cervical arthritis   . Hip arthritis   . COLONIC POLYPS, HX OF 09/24/2007  . SUPRAVENTRICULAR ARRHYTHMIA 12/20/2010  . Hyperlipidemia     Past Surgical History  Procedure Date  . Pilonidal cyst excision   . Breast lumpectomy     left  . Fb removal from popliteal fossae   . Intracapsular cataract extraction      Left eye Oct 18, 2009  right eye Jan '11 By Dr Doloris Hall  . Abdominal hysterectomy     fibroid tumors    History   Social History  . Marital Status: Widowed    Spouse Name: N/A    Number of Children: 6  . Years of Education: N/A   Occupational History  . Retired    Social History Main Topics  . Smoking status: Never Smoker   . Smokeless tobacco: Not on file  . Alcohol Use: No  . Drug Use: No  . Sexually Active: Not on file   Other Topics Concern  . Not on file   Social History Narrative   married - 1951- widowed '094-daughters, 1 died leukemia2 sons10 grandchildren5 great-grandchildrenI-ADLsRetired Tobacco Use - No. Alcohol Use - noCaffeine use/day:  2 cups per dayDental Care w/in 6 mos.:  yesSun Exposure-Excessive:  noSeat Belt Use:  yesFall Risk:  slight fall riskBlood Transfusions:  noHepatitis Risk:  no risk notedHIV Risk:  no risk notedSTD Risk:  no risk notedDrug Use:  never    Current Outpatient Prescriptions on File Prior to Visit  Medication Sig Dispense Refill  . acetaminophen (TYLENOL) 325 MG  tablet Take 650 mg by mouth every 6 (six) hours as needed.        . ergocalciferol (VITAMIN D2) 50000 UNITS capsule Take 1 capsule (50,000 Units total) by mouth once a week.  12 capsule  0  . folic acid-pyridoxine-cyancobalamin (FOLBIC) 2.5-25-2 MG TABS Take 1 tablet by mouth daily.        . nitroGLYCERIN (NITROSTAT) 0.4 MG SL tablet Place 0.4 mg under the tongue every 5 (five) minutes as needed.        . polyethylene glycol (MIRALAX / GLYCOLAX) packet Take 17 g by mouth daily.        . Rivaroxaban (XARELTO) 15 MG TABS Take 1 tablet by mouth daily.  90 tablet  3  . simvastatin (ZOCOR) 10 MG tablet Take 10 mg by mouth at bedtime.        . valsartan (DIOVAN) 160 MG tablet Take 1 tablet (160 mg total) by mouth daily.  90 tablet  3    Allergies  Allergen Reactions  . Codeine     REACTION: fine rash    Family History  Problem Relation Age of Onset  . Heart attack Mother   . Stroke Mother   . Coronary artery disease Mother   .  Heart disease Mother   . Cancer Father     Colon Cancer  . Diabetes Daughter   . Cancer Daughter     Leukemia    BP 154/82  Pulse 72  Temp(Src) 97.9 F (36.6 C) (Oral)  Ht 5\' 2"  (1.575 m)  Wt 136 lb 6.4 oz (61.871 kg)  BMI 24.95 kg/m2  SpO2 97%  Review of Systems Denies sob and earache.    Objective:   Physical Exam GENERAL: no distress LUNGS:  Clear to auscultation HEART: Regular rate and rhythm without murmurs noted. Normal S1,S2.   Ext: no edema Gait: steady with a walker     Assessment & Plan:  Htn, needs increased rx Epistaxis, uncertain if related to bp

## 2011-06-26 NOTE — Telephone Encounter (Signed)
RN s/w Pt's daughter: Gerome Apley re: elevated b/p. Pt b/p has been increasing since Friday all of a sudden. 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 Pt also experienced a nose bleed (trickle for about 45 min). Pt thought the bleed may have been r/t allergies/dry nose. [name] reported monitoring it and blood was not gushing. [name] states Pt denied blurred vision, dizziness, headaches or nausea. RN advised Pt's daughter that Pt should see Primary Care MD today or tomorrow and Pt should be taking to ER or Urgent Care next time b/p is that elevated. RN transferred [name] to [facility] with Primary Care to schedule appt.

## 2011-06-26 NOTE — Telephone Encounter (Signed)
Pt is taking her b/p meds and it is still elevated

## 2011-06-26 NOTE — Patient Instructions (Addendum)
Call if nosebleeding recurs. Increase diovan to 160 mg 2x a day. Please see dr Debby Bud as scheduled.

## 2011-06-27 ENCOUNTER — Ambulatory Visit: Payer: Medicare Other | Admitting: Physical Medicine & Rehabilitation

## 2011-06-27 ENCOUNTER — Encounter: Payer: Self-pay | Admitting: *Deleted

## 2011-06-27 ENCOUNTER — Encounter: Payer: Medicare Other | Attending: Physical Medicine & Rehabilitation

## 2011-06-27 DIAGNOSIS — I519 Heart disease, unspecified: Secondary | ICD-10-CM | POA: Insufficient documentation

## 2011-06-27 DIAGNOSIS — I10 Essential (primary) hypertension: Secondary | ICD-10-CM | POA: Insufficient documentation

## 2011-06-27 DIAGNOSIS — I69921 Dysphasia following unspecified cerebrovascular disease: Secondary | ICD-10-CM

## 2011-06-27 DIAGNOSIS — I4891 Unspecified atrial fibrillation: Secondary | ICD-10-CM | POA: Insufficient documentation

## 2011-06-27 DIAGNOSIS — I69920 Aphasia following unspecified cerebrovascular disease: Secondary | ICD-10-CM | POA: Insufficient documentation

## 2011-06-27 DIAGNOSIS — R29898 Other symptoms and signs involving the musculoskeletal system: Secondary | ICD-10-CM | POA: Insufficient documentation

## 2011-06-27 DIAGNOSIS — I69959 Hemiplegia and hemiparesis following unspecified cerebrovascular disease affecting unspecified side: Secondary | ICD-10-CM | POA: Insufficient documentation

## 2011-06-27 DIAGNOSIS — Z79899 Other long term (current) drug therapy: Secondary | ICD-10-CM | POA: Insufficient documentation

## 2011-06-27 DIAGNOSIS — G8110 Spastic hemiplegia affecting unspecified side: Secondary | ICD-10-CM

## 2011-06-27 DIAGNOSIS — I69998 Other sequelae following unspecified cerebrovascular disease: Secondary | ICD-10-CM | POA: Insufficient documentation

## 2011-06-27 NOTE — Assessment & Plan Note (Signed)
CHIEF COMPLAINT:  Right-sided weakness.  An 77 year old female with left CVA MCA distribution infarct causing right hemiparesis and aphasia.  She had a fall during her stroke which resulted in intra-articular bimalleolar ankle fracture placed in a cast boot weightbearing.  She is now out of cast boot and into a ankle splint.  She is receiving physical therapy for right ankle as well as some of the weakness on the right side.  Her main concern is continued weakness.  She has had a stroke on February 07, 2011.  She has gone through inpatient rehab and now going through outpatient rehab.  She denies any pain.  She can walk with a walker.  Her daughter states that she walks without a walker at home.  She is widowed.  PHYSICAL EXAMINATION:  VITAL SIGNS:  Her blood pressure 173/61, pulse 63, respirations 20, O2 sat 97% on room air. GENERAL:  No acute distress.  Mood and affect appropriate. MUSCULOSKELETAL:  Her right upper extremity is 3-/5 in the deltoid, 3 in the biceps, triceps, and grip.  Right lower extremity has 4+/5 strength in hip flexor, knee extensor, ankle dorsiflexor not fully tested secondary to her ankle splint.  Left side is 5/5.  Speech has mild aphasia.  She is able to name complex objects and is even able to tell me that she has had ablation done for her dysrhythmia.  IMPRESSION:  Right spastic hemiparesis mainly affecting the upper extremity at this point as well as some aphasia.  As discussed with patient, I would think that her physical improvements are near plateau stage.  She will still have some improvement in her speech over time.  I will see her back another time about 6 months post-stroke which would be in September and go over her progress at that point.  She will follow up with primary care who will manage her Diovan.  She just had a blood pressure medicine increased.  Discussed with patient and daughter, agree with plan.     Erick Colace,  M.D. Electronically Signed    AEK/MedQ D:  06/27/2011 15:28:59  T:  06/27/2011 23:49:24  Job #:  [id number]  cc:   Leonides Grills, M.D. Fax: [phone]  Rosalyn Gess. Norins, MD [address] [place] [address]  Doylene Canning. Ladona Ridgel, MD [address]  [address] [place] [address]

## 2011-06-29 ENCOUNTER — Ambulatory Visit: Payer: Medicare Other | Admitting: Physical Therapy

## 2011-07-03 ENCOUNTER — Ambulatory Visit: Payer: Medicare Other | Admitting: Physical Therapy

## 2011-07-06 ENCOUNTER — Ambulatory Visit: Payer: Medicare Other | Attending: Physical Medicine & Rehabilitation | Admitting: Physical Therapy

## 2011-07-06 DIAGNOSIS — I69998 Other sequelae following unspecified cerebrovascular disease: Secondary | ICD-10-CM | POA: Insufficient documentation

## 2011-07-06 DIAGNOSIS — I69919 Unspecified symptoms and signs involving cognitive functions following unspecified cerebrovascular disease: Secondary | ICD-10-CM | POA: Insufficient documentation

## 2011-07-06 DIAGNOSIS — R279 Unspecified lack of coordination: Secondary | ICD-10-CM | POA: Insufficient documentation

## 2011-07-06 DIAGNOSIS — Z5189 Encounter for other specified aftercare: Secondary | ICD-10-CM | POA: Insufficient documentation

## 2011-07-06 DIAGNOSIS — M6281 Muscle weakness (generalized): Secondary | ICD-10-CM | POA: Insufficient documentation

## 2011-07-06 DIAGNOSIS — R269 Unspecified abnormalities of gait and mobility: Secondary | ICD-10-CM | POA: Insufficient documentation

## 2011-07-10 ENCOUNTER — Encounter: Payer: Medicare Other | Admitting: Internal Medicine

## 2011-07-11 ENCOUNTER — Ambulatory Visit: Payer: Medicare Other | Admitting: Physical Therapy

## 2011-07-13 ENCOUNTER — Ambulatory Visit: Payer: Medicare Other | Admitting: Physical Therapy

## 2011-07-14 ENCOUNTER — Ambulatory Visit (INDEPENDENT_AMBULATORY_CARE_PROVIDER_SITE_OTHER): Payer: Medicare Other | Admitting: Physician Assistant

## 2011-07-14 ENCOUNTER — Encounter: Payer: Self-pay | Admitting: Physician Assistant

## 2011-07-14 DIAGNOSIS — I471 Supraventricular tachycardia: Secondary | ICD-10-CM

## 2011-07-14 DIAGNOSIS — R000 Tachycardia, unspecified: Secondary | ICD-10-CM

## 2011-07-14 DIAGNOSIS — I4891 Unspecified atrial fibrillation: Secondary | ICD-10-CM

## 2011-07-14 DIAGNOSIS — I10 Essential (primary) hypertension: Secondary | ICD-10-CM

## 2011-07-14 MED ORDER — AMLODIPINE BESYLATE 5 MG PO TABS: 5.0000 mg | ORAL_TABLET | Freq: Every day | ORAL | Status: DC

## 2011-07-14 NOTE — Patient Instructions (Signed)
Your physician recommends that you schedule a follow-up appointment in: 3 months with Dr. Ladona Ridgel, this does not need to be on a device day.  Your physician has recommended you make the following change in your medication: START NORVASC 5 MG 1 TABLET DAILY, PRESCRIPTION WAS SENT TO [facility] TODAY FOR YOU AS WELL AS A PAPER PRESCRIPTION WAS GIVEN TO YOU TO TAKE TO YOUR LOCAL PHARMACY

## 2011-07-14 NOTE — Assessment & Plan Note (Signed)
Maintaining NSR.  Continue Xarelto for stroke prophylaxis.  Continue amiodarone.  Follow up with Dr. Ladona Ridgel in 3 months.

## 2011-07-14 NOTE — Progress Notes (Signed)
History of Present Illness: Primary Electrophysiologist:  Dr. Lewayne Bunting   [name] is a 77 y.o. female who presents for post hospital follow up.  She has a history of tachycardia palpitations and documented SVT.  The patient had been treated with antiarrhythmic drug therapy with flecainide.  The patient had never had documented atrial fibrillation.  She sustained a stroke earlier this year while she was visiting in [place] and had her medications were changed.  She was placed on amiodarone instead of the flecainide.   She is also on Xarelto.  She saw Dr. Ladona Ridgel in 4/12 and was doing ok.  She then was admitted to the hospital in 6/12 with syncope and orthostasis.  She had SVT which converted with IV lopressor.  She had her HCT discontinued and was set up to come back for EPS.  This was done on 6/19 and the EPS demonstrated successful RFCA of AVNRT and apparent atrial tach.  Findings were also concerning for recurrent atrial arrhythmias in the future.  Since discharge she has felt well.  BP has been high and she saw her PCP with increased dose of Diovan.  Her daughter is a Engineer, civil (consulting) and her home BPs are 160-170 systolic.  They generally go up in MD office.  The patient denies chest pain, shortness of breath, syncope, orthopnea, PND or significant pedal edema.   Past Medical History  Diagnosis Date  . HTN (hypertension)   . History of colonic polyps   . Cervical arthritis   . Hip arthritis   . COLONIC POLYPS, HX OF 09/24/2007  . SUPRAVENTRICULAR ARRHYTHMIA 12/20/2010    s/p RFCA 05/2010-Dr. Ladona Ridgel  . Hyperlipidemia   . Atrial fibrillation   . History of stroke     Current Outpatient Prescriptions  Medication Sig Dispense Refill  . acetaminophen (TYLENOL) 325 MG tablet Take 650 mg by mouth every 6 (six) hours as needed.        Marland Kitchen amiodarone (PACERONE) 200 MG tablet 1/2 tablet (100mg )  by mouth twice a day.      . Cholecalciferol (VITAMIN D) 1000 UNITS capsule Take 1,000 Units by mouth daily.         . folic acid-pyridoxine-cyancobalamin (FOLBIC) 2.5-25-2 MG TABS Take 1 tablet by mouth daily.        . nitroGLYCERIN (NITROSTAT) 0.4 MG SL tablet Place 0.4 mg under the tongue every 5 (five) minutes as needed.        . polyethylene glycol (MIRALAX / GLYCOLAX) packet Take 17 g by mouth daily.        . Rivaroxaban (XARELTO) 15 MG TABS Take 1 tablet by mouth daily.  90 tablet  3  . simvastatin (ZOCOR) 10 MG tablet Take 10 mg by mouth at bedtime.        . valsartan (DIOVAN) 160 MG tablet Take 1 tablet (160 mg total) by mouth 2 (two) times daily.  180 tablet  3  . amLODipine (NORVASC) 5 MG tablet Take 1 tablet (5 mg total) by mouth daily.  90 tablet  3  . DISCONTD: amLODipine (NORVASC) 5 MG tablet Take 1 tablet (5 mg total) by mouth daily.  30 tablet  3    Allergies: Allergies  Allergen Reactions  . Codeine     REACTION: fine rash    Vital Signs: BP 193/76  Pulse 61  Resp 18  Ht 5\' 1"  (1.549 m)  Wt 132 lb 6.4 oz (60.056 kg)  BMI 25.02 kg/m2  PHYSICAL EXAM: Well nourished, well  developed, in no acute distress HEENT: normal Neck: no JVD; no swelling or erythema at [facility] site Cardiac:  normal S1, S2; RRR; no murmur Lungs:  clear to auscultation bilaterally, no wheezing, rhonchi or rales Abd: soft, nontender, no hepatomegaly Ext: no edema; right groin with tenderness or bruit Skin: warm and dry Neuro:  CNs 2-12 intact, no focal abnormalities noted  EKG:  NSR, HR 61, LAD, NSSTTW changes   ASSESSMENT AND PLAN:

## 2011-07-14 NOTE — Assessment & Plan Note (Signed)
She feels much better and has not had recurrent palpitations.  ECG is stable today.  I will have her see Dr. Ladona Ridgel in 3 months.

## 2011-07-14 NOTE — Assessment & Plan Note (Signed)
Uncontrolled.  Add norvasc 5 mg QD.

## 2011-07-18 ENCOUNTER — Ambulatory Visit: Payer: Medicare Other | Admitting: Physical Therapy

## 2011-07-20 ENCOUNTER — Ambulatory Visit: Payer: Medicare Other | Admitting: Physical Therapy

## 2011-07-25 ENCOUNTER — Ambulatory Visit: Payer: Medicare Other | Admitting: Physical Therapy

## 2011-07-28 ENCOUNTER — Ambulatory Visit: Payer: Medicare Other | Admitting: Physical Therapy

## 2011-07-31 ENCOUNTER — Ambulatory Visit: Payer: Medicare Other | Admitting: Physical Therapy

## 2011-08-04 ENCOUNTER — Ambulatory Visit: Payer: Medicare Other | Admitting: Physical Therapy

## 2011-08-08 ENCOUNTER — Ambulatory Visit: Payer: Medicare Other | Attending: Physical Medicine & Rehabilitation | Admitting: *Deleted

## 2011-08-08 DIAGNOSIS — M6281 Muscle weakness (generalized): Secondary | ICD-10-CM | POA: Insufficient documentation

## 2011-08-08 DIAGNOSIS — I69998 Other sequelae following unspecified cerebrovascular disease: Secondary | ICD-10-CM | POA: Insufficient documentation

## 2011-08-08 DIAGNOSIS — R269 Unspecified abnormalities of gait and mobility: Secondary | ICD-10-CM | POA: Insufficient documentation

## 2011-08-08 DIAGNOSIS — Z5189 Encounter for other specified aftercare: Secondary | ICD-10-CM | POA: Insufficient documentation

## 2011-08-08 DIAGNOSIS — I69919 Unspecified symptoms and signs involving cognitive functions following unspecified cerebrovascular disease: Secondary | ICD-10-CM | POA: Insufficient documentation

## 2011-08-08 DIAGNOSIS — R279 Unspecified lack of coordination: Secondary | ICD-10-CM | POA: Insufficient documentation

## 2011-08-09 ENCOUNTER — Ambulatory Visit: Payer: Medicare Other | Admitting: Physical Therapy

## 2011-08-10 ENCOUNTER — Ambulatory Visit: Payer: Medicare Other | Admitting: Physical Therapy

## 2011-08-14 ENCOUNTER — Ambulatory Visit: Payer: Medicare Other | Admitting: Physical Therapy

## 2011-08-14 ENCOUNTER — Other Ambulatory Visit: Payer: Self-pay | Admitting: Internal Medicine

## 2011-08-14 NOTE — Telephone Encounter (Signed)
Pt daughter calling to report pt has not had any episodes of SVT since pt ablation. Pt daughter wants to know if it is advised that pt decrease her amiodarone. Please return call to discuss further.

## 2011-08-14 NOTE — Telephone Encounter (Signed)
Called and spoke with daughter   [name] would ask Dr Ladona Ridgel what he wants to do with her Amiodarone and call her back

## 2011-08-15 NOTE — Telephone Encounter (Signed)
Discussed with Dr Ladona Ridgel ok to decrease to 100mg  daily  Daughter aware and will decrease Amiodarone to 100mg  q hs

## 2011-08-17 ENCOUNTER — Ambulatory Visit: Payer: Medicare Other | Admitting: Physical Therapy

## 2011-08-21 ENCOUNTER — Ambulatory Visit: Payer: Medicare Other | Admitting: Physical Therapy

## 2011-08-22 ENCOUNTER — Ambulatory Visit: Payer: Medicare Other | Admitting: Physical Medicine & Rehabilitation

## 2011-08-22 ENCOUNTER — Encounter: Payer: Medicare Other | Attending: Physical Medicine & Rehabilitation

## 2011-08-22 DIAGNOSIS — I519 Heart disease, unspecified: Secondary | ICD-10-CM | POA: Insufficient documentation

## 2011-08-22 DIAGNOSIS — Z79899 Other long term (current) drug therapy: Secondary | ICD-10-CM | POA: Insufficient documentation

## 2011-08-22 DIAGNOSIS — G8110 Spastic hemiplegia affecting unspecified side: Secondary | ICD-10-CM

## 2011-08-22 DIAGNOSIS — I4891 Unspecified atrial fibrillation: Secondary | ICD-10-CM | POA: Insufficient documentation

## 2011-08-22 DIAGNOSIS — I69959 Hemiplegia and hemiparesis following unspecified cerebrovascular disease affecting unspecified side: Secondary | ICD-10-CM | POA: Insufficient documentation

## 2011-08-22 DIAGNOSIS — I10 Essential (primary) hypertension: Secondary | ICD-10-CM | POA: Insufficient documentation

## 2011-08-22 DIAGNOSIS — R29898 Other symptoms and signs involving the musculoskeletal system: Secondary | ICD-10-CM | POA: Insufficient documentation

## 2011-08-22 DIAGNOSIS — I69920 Aphasia following unspecified cerebrovascular disease: Secondary | ICD-10-CM | POA: Insufficient documentation

## 2011-08-22 DIAGNOSIS — I69998 Other sequelae following unspecified cerebrovascular disease: Secondary | ICD-10-CM | POA: Insufficient documentation

## 2011-08-22 NOTE — Assessment & Plan Note (Signed)
An 77 year old female who had a left CVA causing right hemiparesis onset in March.  It was MCA distribution.  She had right hemiparesis as well as aphasia.  She fell during the stroke had an intra-articular bimalleolar ankle fracture placed in cast boot with limited weightbearing.  She has recovered from her ankle fracture.  She has had physical therapy and has now finished up.  I last saw her on June 27, 2011.  She has had another fall.  She was not using her cane.  She was upgraded from a walker to a cane.  She can walk 10 minutes at a time. She was on her deck trying to bend over to pick up a flower and she basically just sat down, her daughter saw her outside, daughter was unusual, but once again no injury.  The patient states she was not using a cane.  We reiterated she needs to use a cane at all times.  She is able to climb steps.  She does not drive.  She gets family supervision.  PHYSICAL EXAMINATION:  VITAL SIGNS:  Blood pressure 180/51, we once again told her to call her primary care physician to see if blood pressure medications can be adjusted.  She states that it were adjusted recently and in fact Norvasc was added once a day.  She will need to follow up with her primary on this, pulse 61, respirations 14, and O2 sat 96% on room air. GENERAL:  Elderly female in no acute distress.  She is able to repeat [employer] school as well as no if sense or but she has a mild dysarthria. MUSCULOSKELETAL:  Right upper extremity strength is 3+ at the deltoid, 3+ in the biceps, triceps grip, right lower extremity 4+ in hip flexor, knee extensor, and ankle dorsiflexor 5/5 on the left side.  IMPRESSION:  Right spastic hemiplegia, left middle cerebral artery distribution cerebrovascular accident, her aphasia has improved although it is not 100%.  PLAN: 1. Continue home exercise program. 2. I will see her back in 6 months. 3. Primary care followup for blood pressure. 4. Always use a  cane.  Discussed with patient and daughter, agree with plan.     Erick Colace, M.D. Electronically Signed    AEK/MedQ D:  08/22/2011 14:00:54  T:  08/22/2011 22:14:24  Job #:  [id number]  cc:   Doylene Canning. Ladona Ridgel, MD [address]  [address] [place] [address]  Rosalyn Gess. Norins, MD [address] [place] [address]  Leonides Grills, M.D. Fax: [phone]

## 2011-08-24 ENCOUNTER — Ambulatory Visit: Payer: Medicare Other | Admitting: Physical Therapy

## 2011-08-28 ENCOUNTER — Ambulatory Visit: Payer: Medicare Other | Admitting: Physical Therapy

## 2011-08-31 ENCOUNTER — Ambulatory Visit: Payer: Medicare Other | Admitting: Physical Therapy

## 2011-09-20 ENCOUNTER — Ambulatory Visit (INDEPENDENT_AMBULATORY_CARE_PROVIDER_SITE_OTHER): Payer: Medicare Other | Admitting: *Deleted

## 2011-09-20 DIAGNOSIS — Z23 Encounter for immunization: Secondary | ICD-10-CM

## 2011-10-12 ENCOUNTER — Other Ambulatory Visit: Payer: Self-pay | Admitting: Internal Medicine

## 2011-10-25 ENCOUNTER — Ambulatory Visit (INDEPENDENT_AMBULATORY_CARE_PROVIDER_SITE_OTHER): Payer: Medicare Other | Admitting: Internal Medicine

## 2011-10-25 ENCOUNTER — Encounter: Payer: Self-pay | Admitting: Internal Medicine

## 2011-10-25 DIAGNOSIS — I4891 Unspecified atrial fibrillation: Secondary | ICD-10-CM

## 2011-10-25 DIAGNOSIS — I10 Essential (primary) hypertension: Secondary | ICD-10-CM

## 2011-10-25 NOTE — Patient Instructions (Signed)
Your physician wants you to follow-up in: 6 months with Dr Taylor You will receive a reminder letter in the mail two months in advance. If you don't receive a letter, please call our office to schedule the follow-up appointment.  

## 2011-11-01 ENCOUNTER — Encounter: Payer: Self-pay | Admitting: Internal Medicine

## 2011-11-01 NOTE — Assessment & Plan Note (Signed)
Her blood pressure is elevated slightly today but she notes that at home it has been fairly well controlled.

## 2011-11-01 NOTE — Progress Notes (Signed)
HPI [name] returns today for followup. She is a pleasant 77 yo woman with a h/o a stroke, PAF which is tachycardic related. She underwent EPS/RFA of SVT several months ago and at that time she had inducible atrial fib and has been on amiodarone since. She has done well in the interim. She denies syncope. She has minimal palpitations. No edema. Allergies  Allergen Reactions  . Codeine     REACTION: fine rash     Current Outpatient Prescriptions  Medication Sig Dispense Refill  . acetaminophen (TYLENOL) 325 MG tablet Take 650 mg by mouth every 6 (six) hours as needed.        Marland Kitchen amiodarone (PACERONE) 200 MG tablet Take 0.5 tablets (100 mg total) by mouth daily. 1/2 tablet (100mg )  by mouth twice a day.      Marland Kitchen amLODipine (NORVASC) 5 MG tablet Take 1 tablet (5 mg total) by mouth daily.  90 tablet  3  . Cholecalciferol (VITAMIN D) 1000 UNITS capsule Take 1,000 Units by mouth daily.        . FOLBIC 2.5-25-2 MG TABS TAKE 1 TABLET DAILY  90 tablet  1  . polyethylene glycol (MIRALAX / GLYCOLAX) packet Take 17 g by mouth daily.        . Rivaroxaban (XARELTO) 15 MG TABS Take 1 tablet by mouth daily.  90 tablet  3  . simvastatin (ZOCOR) 10 MG tablet Take 10 mg by mouth at bedtime.        . valsartan (DIOVAN) 160 MG tablet Take 1 tablet (160 mg total) by mouth 2 (two) times daily.  180 tablet  3  . nitroGLYCERIN (NITROSTAT) 0.4 MG SL tablet Place 0.4 mg under the tongue every 5 (five) minutes as needed.           Past Medical History  Diagnosis Date  . HTN (hypertension)   . History of colonic polyps   . Cervical arthritis   . Hip arthritis   . COLONIC POLYPS, HX OF 09/24/2007  . SUPRAVENTRICULAR ARRHYTHMIA 12/20/2010    s/p RFCA 05/2010-Dr. Ladona Ridgel  . Hyperlipidemia   . Atrial fibrillation   . History of stroke   . H/O: hysterectomy     fibroid tumors    ROS:   All systems reviewed and negative except as noted in the HPI.   Past Surgical History  Procedure Date  . Pilonidal cyst  excision   . Breast lumpectomy     left  . Fb removal from popliteal fossae   . Intracapsular cataract extraction      Left eye Oct 18, 2009  right eye Jan '11 By Dr Doloris Hall  . Colon surgery 03/19/2009    colon cancer   . Transthoracic echocardiogram 2007     Family History  Problem Relation Age of Onset  . Heart attack Mother   . Stroke Mother   . Coronary artery disease Mother   . Heart disease Mother   . Cancer Father     Colon Cancer  . Diabetes Daughter   . Cancer Daughter     Leukemia     History   Social History  . Marital Status: Widowed    Spouse Name: N/A    Number of Children: 6  . Years of Education: N/A   Occupational History  . Retired    Social History Main Topics  . Smoking status: Never Smoker   . Smokeless tobacco: Not on file  . Alcohol Use: No  . Drug  Use: No  . Sexually Active: Not on file   Other Topics Concern  . Not on file   Social History Narrative   married - 1951- widowed '094-daughters, 1 died leukemia2 sons10 grandchildren5 great-grandchildrenI-ADLsRetired Tobacco Use - No. Alcohol Use - noCaffeine use/day:  2 cups per dayDental Care w/in 6 mos.:  yesSun Exposure-Excessive:  noSeat Belt Use:  yesFall Risk:  slight fall riskBlood Transfusions:  noHepatitis Risk:  no risk notedHIV Risk:  no risk notedSTD Risk:  no risk notedDrug Use:  never     BP 149/68  Pulse 67  Ht 5' 1.5" (1.562 m)  Wt 59.603 kg (131 lb 6.4 oz)  BMI 24.43 kg/m2  Physical Exam:  Well appearing elderly woman, NAD HEENT: Unremarkable Neck:  No JVD, no thyromegally Lymphatics:  No adenopathy Back:  No CVA tenderness Lungs:  Clear with no wheezes, rales, or rhonchi HEART:  Regular rate rhythm, no murmurs, no rubs, no clicks Abd:  soft, positive bowel sounds, no organomegally, no rebound, no guarding Ext:  2 plus pulses, no edema, no cyanosis, no clubbing Skin:  No rashes no nodules Neuro:  CN II through XII intact, motor grossly  intact  Assess/Plan:

## 2011-11-01 NOTE — Assessment & Plan Note (Signed)
She appears to be maintaining NSR. She will continue amiodarone with plans to reduce her dose in several months.

## 2011-11-19 ENCOUNTER — Other Ambulatory Visit: Payer: Self-pay | Admitting: Internal Medicine

## 2011-11-24 ENCOUNTER — Encounter: Payer: Medicare Other | Admitting: Internal Medicine

## 2011-11-30 ENCOUNTER — Other Ambulatory Visit: Payer: Self-pay | Admitting: Internal Medicine

## 2011-11-30 ENCOUNTER — Encounter: Payer: Self-pay | Admitting: Internal Medicine

## 2011-11-30 ENCOUNTER — Ambulatory Visit (INDEPENDENT_AMBULATORY_CARE_PROVIDER_SITE_OTHER): Payer: Medicare Other | Admitting: Internal Medicine

## 2011-11-30 ENCOUNTER — Other Ambulatory Visit (INDEPENDENT_AMBULATORY_CARE_PROVIDER_SITE_OTHER): Payer: Medicare Other

## 2011-11-30 DIAGNOSIS — E785 Hyperlipidemia, unspecified: Secondary | ICD-10-CM

## 2011-11-30 DIAGNOSIS — Z0000 Encounter for general adult medical examination without abnormal findings: Secondary | ICD-10-CM

## 2011-11-30 DIAGNOSIS — I10 Essential (primary) hypertension: Secondary | ICD-10-CM

## 2011-11-30 DIAGNOSIS — I639 Cerebral infarction, unspecified: Secondary | ICD-10-CM

## 2011-11-30 DIAGNOSIS — I4891 Unspecified atrial fibrillation: Secondary | ICD-10-CM

## 2011-11-30 DIAGNOSIS — I6350 Cerebral infarction due to unspecified occlusion or stenosis of unspecified cerebral artery: Secondary | ICD-10-CM

## 2011-11-30 LAB — LIPID PANEL
Cholesterol: 202 mg/dL — ABNORMAL HIGH (ref 0–200)
HDL: 118.2 mg/dL (ref >39.00)
Total CHOL/HDL Ratio: 2
Triglycerides: 70 mg/dL (ref 0.0–149.0)
VLDL: 14 mg/dL (ref 0.0–40.0)

## 2011-11-30 LAB — CBC WITH DIFFERENTIAL/PLATELET
Basophils Absolute: 0.1 10*3/uL (ref 0.0–0.1)
Basophils Relative: 0.8 % (ref 0.0–3.0)
Eosinophils Absolute: 0 10*3/uL (ref 0.0–0.7)
Eosinophils Relative: 0.5 % (ref 0.0–5.0)
HCT: 43.8 % (ref 36.0–46.0)
Hemoglobin: 14.7 g/dL (ref 12.0–15.0)
Lymphocytes Relative: 16.9 % (ref 12.0–46.0)
Lymphs Abs: 1.6 10*3/uL (ref 0.7–4.0)
MCHC: 33.7 g/dL (ref 30.0–36.0)
MCV: 87.7 fl (ref 78.0–100.0)
Monocytes Absolute: 0.7 10*3/uL (ref 0.1–1.0)
Monocytes Relative: 7.1 % (ref 3.0–12.0)
Neutro Abs: 7 10*3/uL (ref 1.4–7.7)
Neutrophils Relative %: 74.7 % (ref 43.0–77.0)
Platelets: 220 10*3/uL (ref 150.0–400.0)
RBC: 4.99 Mil/uL (ref 3.87–5.11)
RDW: 16 % — ABNORMAL HIGH (ref 11.5–14.6)
WBC: 9.3 10*3/uL (ref 4.5–10.5)

## 2011-11-30 LAB — HEPATIC FUNCTION PANEL
ALT: 44 U/L — ABNORMAL HIGH (ref 0–35)
AST: 37 U/L (ref 0–37)
Albumin: 4.3 g/dL (ref 3.5–5.2)
Alkaline Phosphatase: 53 U/L (ref 39–117)
Bilirubin, Direct: 0.1 mg/dL (ref 0.0–0.3)
Total Bilirubin: 0.5 mg/dL (ref 0.3–1.2)
Total Protein: 7.4 g/dL (ref 6.0–8.3)

## 2011-11-30 LAB — COMPREHENSIVE METABOLIC PANEL
ALT: 44 U/L — ABNORMAL HIGH (ref 0–35)
AST: 37 U/L (ref 0–37)
Albumin: 4.3 g/dL (ref 3.5–5.2)
Alkaline Phosphatase: 53 U/L (ref 39–117)
BUN: 16 mg/dL (ref 6–23)
CO2: 27 mEq/L (ref 19–32)
Calcium: 10 mg/dL (ref 8.4–10.5)
Chloride: 103 mEq/L (ref 96–112)
Creatinine, Ser: 1.1 mg/dL (ref 0.4–1.2)
GFR: 48.55 mL/min — ABNORMAL LOW (ref >60.00)
Glucose, Bld: 89 mg/dL (ref 70–99)
Potassium: 4 mEq/L (ref 3.5–5.1)
Sodium: 138 mEq/L (ref 135–145)
Total Bilirubin: 0.5 mg/dL (ref 0.3–1.2)
Total Protein: 7.4 g/dL (ref 6.0–8.3)

## 2011-11-30 LAB — LDL CHOLESTEROL, DIRECT: Direct LDL: 60.2 mg/dL

## 2011-11-30 NOTE — Progress Notes (Signed)
Subjective:    Patient ID: [name], female    DOB: Sep 30, 1928, 77 y.o.   MRN: [id number]  HPI   The patient is here for annual Medicare wellness examination and management of other chronic and acute problems.  Interval history: March 6th had CVA - left MCA stroke with expressive aphasia and right sided weakness, RUE. She did have code stroke with TPA within 2 hrs. In [place], [place]. She transferred to [facility] for 10 days and then home. She continues to see Dr. Fritzi Mandes for rehab. She does follow with Dr.Taylor for PSVT. Her stroke was most likely an embolic event related to PSVT, a. Fib. She did have an ablation procedure in sept '12 for SVT and is on Xeralto. Since Sept she has had a minor syncopal episode. They are now interested in Etanercept injection therapy for recovery from residual hemiparesis and abnormal gait and balance.   She did sustain a fracture of the right ankle at the time of her stroke and fall. She was seen by Dr. Lestine Box since returning to [facility] - she did not require surgery.   The risk factors are reflected in the social history.  The roster of all physicians providing medical care to patient - is listed in the Snapshot section of the chart.  Activities of daily living:  The patient is 100% inedpendent in all ADLs: dressing, toileting, feeding as well as independent mobility  Home safety : The patient has smoke detectors in the home. They wear seatbelts. No firearms at home. There is no violence in the home.   There is no risks for hepatitis, STDs or HIV. There is no   history of blood transfusion. They have no travel history to infectious disease endemic areas of the world.  The patient has seen their dentist in the last six month. They have not seen their eye doctor in the last year. They deny any hearing difficulty and have not had audiologic testing in the last year.  They do not  have excessive sun exposure. Discussed the need for sun protection:  hats, long sleeves and use of sunscreen if there is significant sun exposure.   Diet: the importance of a healthy diet is discussed. They do have a healthy diet.  The patient has a regular exercise program-continues with exercises as learned in rehab.  The benefits of regular aerobic exercise were discussed.  Depression screen: there are no signs or vegative symptoms of depression- irritability, change in appetite, anhedonia, sadness/tearfullness.  Cognitive assessment: the patient manages all their financial and personal affairs and is actively engaged.   The following portions of the patient's history were reviewed and updated as appropriate: allergies, current medications, past family history, past medical history,  past surgical history, past social history  and problem list.  Vision, hearing, body mass index were assessed and reviewed.   During the course of the visit the patient was educated and counseled about appropriate screening and preventive services including : fall prevention , diabetes screening, nutrition counseling, colorectal cancer screening, and recommended immunizations.  Past Medical History  Diagnosis Date  . HTN (hypertension)   . History of colonic polyps   . Cervical arthritis   . Hip arthritis   . COLONIC POLYPS, HX OF 09/24/2007  . SUPRAVENTRICULAR ARRHYTHMIA 12/20/2010    s/p RFCA 05/2010-Dr. Ladona Ridgel  . Hyperlipidemia   . Atrial fibrillation   . History of stroke   . H/O: hysterectomy     fibroid tumors  Past Surgical History  Procedure Date  . Pilonidal cyst excision   . Breast lumpectomy     left  . Fb removal from popliteal fossae   . Intracapsular cataract extraction      Left eye Oct 18, 2009  right eye Jan '11 By Dr Doloris Hall  . Colon surgery 03/19/2009    colon cancer   . Transthoracic echocardiogram 2007   Family History  Problem Relation Age of Onset  . Heart attack Mother   . Stroke Mother   . Coronary artery disease Mother   .  Heart disease Mother   . Cancer Father     Colon Cancer  . Diabetes Daughter   . Cancer Daughter     Leukemia   History   Social History  . Marital Status: Widowed    Spouse Name: N/A    Number of Children: 6  . Years of Education: N/A   Occupational History  . Retired    Social History Main Topics  . Smoking status: Never Smoker   . Smokeless tobacco: Never Used  . Alcohol Use: Yes     rare  . Drug Use: No  . Sexually Active: Not Currently   Other Topics Concern  . Not on file   Social History Narrative   married - 1951- widowed 04/15/23. 4-daughters, 1 died leukemia; 2 sons; 10 grandchildren; 5 great-grandchildren. I-ADLs. Retired. Lives in her own home but one of her children is with her all of the time. ACP- No CPR, no prolonged mechanical ventilation; not to be sustained long term with artificial feeding or hydration; no chronic HD; no other heroic or futile.       Review of Systems Constitutional:  Negative for fever, chills, activity change and unexpected weight change.  HEENT:  Negative for hearing loss, ear pain, congestion, neck stiffness and postnasal drip. Negative for sore throat or swallowing problems. Negative for dental complaints.   Eyes: Negative for vision loss or change in visual acuity.  Respiratory: Negative for chest tightness and wheezing. Negative for DOE.   Cardiovascular: Negative for chest pain or palpitations. No decreased exercise tolerance Gastrointestinal: No change in bowel habit. No bloating or gas. No reflux or indigestion Genitourinary: Negative for urgency, frequency, flank pain and difficulty urinating.  Musculoskeletal: Negative for myalgias, back pain, arthralgias and gait problem.  Neurological: Negative for dizziness, tremors and headaches. Has some residual weakness right UE Hematological: Negative for adenopathy.  Psychiatric/Behavioral: Negative for behavioral problems and dysphoric mood.       Objective:   Physical  Exam Vitals reviewed - stable Gen'l- WNWD white woman looking fit and in no distress HEENT- Head is normal; C&S clear, PERRLA, lid droop right eye-down to the pupil, visual acuity grossly normal; EACs/ TMs normal, hearing normal; oropharynx w/o lesions. Neck - supple, w/o thyromegaly Nodes - negative in the cervical, supraclavicular region. Chest - no defromity Breast exam- deferred Pulm - normal respirations with normal breath sounds Cor- 2+ radial pulse, RRR Abd- BS+, soft Ext- no erythema, synovial thickening or deformity small or medium sized joints. Neuro- A&O x 3; CN II-XII normal facial symmetry, EOMI; Speech is clear; MS- good grip strength, normal proximal strength including right UE; no tremor; DTRs 2+ and normal; gait - OK with cane.  Lab Results  Component Value Date   WBC 9.3 11/30/2011   HGB 14.7 11/30/2011   HCT 43.8 11/30/2011   PLT 220.0 11/30/2011   GLUCOSE 89 11/30/2011   CHOL 202* 11/30/2011  TRIG 70.0 11/30/2011   HDL 118.20 11/30/2011   LDLDIRECT 60.2 11/30/2011   LDLCALC 70 11/22/2010   ALT 44* 11/30/2011   ALT 44* 11/30/2011   AST 37 11/30/2011   AST 37 11/30/2011   NA 138 11/30/2011   K 4.0 11/30/2011   CL 103 11/30/2011   CREATININE 1.1 11/30/2011   BUN 16 11/30/2011   CO2 27 11/30/2011   TSH 1.162 05/08/2011   INR 1.07 05/22/2011          Assessment & Plan:

## 2011-12-01 DIAGNOSIS — Z0000 Encounter for general adult medical examination without abnormal findings: Secondary | ICD-10-CM | POA: Insufficient documentation

## 2011-12-01 NOTE — Assessment & Plan Note (Signed)
Interval history is remarkable for CVA, SVT ablation and management of atrial fibrillation. She has done very well. Limited physical exam is normal. Lab results are satisfactory. She is current with colorectal cancer screening with last study April '09 and at her age further screening (2019) is not necessary. She is due for mammography with last study in Nov '10. Immunizations: Tetanus current Dec '10; Pneumonia vaccine current Sept '07; Shingles vaccine current March '10.  In summary- a delightful woman who has survived a serious stroke with minimal sequelae. She remains active and very independent. At this time she is medically stable. She will return as needed.

## 2011-12-01 NOTE — Assessment & Plan Note (Signed)
Excellent control with LDL better than goal of 80 or less.  Plan - continue present medications

## 2011-12-01 NOTE — Assessment & Plan Note (Signed)
HIstory per HPI. She has done REALLY well with minor sequellae. She will be seeing Dr, Bryson Dames today for rehab follow-up. They will ask him about use of etanercept.  Plan - continued risk reduction           Continue home exercises           Use rolling walker to maximally reduce risk of falls.

## 2011-12-01 NOTE — Assessment & Plan Note (Signed)
BP Readings from Last 3 Encounters:  11/30/11 154/68  10/25/11 149/68  07/14/11 193/76   Mild elevation today but general ok. Wish to avoid too significant at drop.  Plan - continue present meds           Monitor BP at home and report back, especially if SBP consistently runs higher than mid 140's

## 2011-12-01 NOTE — Assessment & Plan Note (Signed)
See cardiology notes. She is s/p ablation for SVT. Heart rate on exam today was regular.

## 2011-12-03 ENCOUNTER — Encounter: Payer: Self-pay | Admitting: Internal Medicine

## 2011-12-10 ENCOUNTER — Other Ambulatory Visit: Payer: Self-pay | Admitting: Internal Medicine

## 2012-02-17 ENCOUNTER — Other Ambulatory Visit: Payer: Self-pay | Admitting: Internal Medicine

## 2012-02-19 NOTE — Telephone Encounter (Signed)
Done

## 2012-02-20 ENCOUNTER — Encounter: Payer: Medicare Other | Attending: Physical Medicine & Rehabilitation

## 2012-02-20 ENCOUNTER — Ambulatory Visit (HOSPITAL_BASED_OUTPATIENT_CLINIC_OR_DEPARTMENT_OTHER): Payer: Medicare Other | Admitting: Physical Medicine & Rehabilitation

## 2012-02-20 ENCOUNTER — Encounter: Payer: Self-pay | Admitting: Physical Medicine & Rehabilitation

## 2012-02-20 VITALS — BP 170/76 | HR 58 | Resp 16 | Ht 62.0 in | Wt 131.0 lb

## 2012-02-20 DIAGNOSIS — M7500 Adhesive capsulitis of unspecified shoulder: Secondary | ICD-10-CM

## 2012-02-20 DIAGNOSIS — I10 Essential (primary) hypertension: Secondary | ICD-10-CM | POA: Insufficient documentation

## 2012-02-20 DIAGNOSIS — M2420 Disorder of ligament, unspecified site: Secondary | ICD-10-CM | POA: Insufficient documentation

## 2012-02-20 DIAGNOSIS — M629 Disorder of muscle, unspecified: Secondary | ICD-10-CM | POA: Insufficient documentation

## 2012-02-20 DIAGNOSIS — G8111 Spastic hemiplegia affecting right dominant side: Secondary | ICD-10-CM | POA: Insufficient documentation

## 2012-02-20 DIAGNOSIS — G8190 Hemiplegia, unspecified affecting unspecified side: Secondary | ICD-10-CM | POA: Insufficient documentation

## 2012-02-20 DIAGNOSIS — R279 Unspecified lack of coordination: Secondary | ICD-10-CM | POA: Insufficient documentation

## 2012-02-20 DIAGNOSIS — G8110 Spastic hemiplegia affecting unspecified side: Secondary | ICD-10-CM

## 2012-02-20 NOTE — Patient Instructions (Signed)
Hamstring Strain with Rehab The hamstring muscle and tendons are vulnerable to muscle or tendon tear (strain). Hamstring tears cause pain and inflammation in the backside of the thigh, where the hamstring muscles are located. The hamstring is comprised of three muscles that are responsible for straightening the hip, bending the knee, and stabilizing the knee. These muscles are important for walking, running, and jumping. Hamstring strain is the most common injury of the thigh. Hamstring strains are classified as grade 1 or 2 strains. Grade 1 strains cause pain, but the tendon is not lengthened. Grade 2 strains include a lengthened ligament due to the ligament being stretched or partially ruptured. With grade 2 strains there is still function, although the function may be decreased.  SYMPTOMS   Pain, tenderness, swelling, warmth, or redness over the hamstring muscles, at the back of the thigh.   Pain that gets worse during and after intense activity.   A "pop" heard in the area, at the time of injury.   Muscle spasm in the hamstring muscles.   Pain or weakness with running, jumping, or bending the knee against resistance.   Crackling sound (crepitation) when the tendon is moved or touched.   Bruising (contusion) in the thigh within 48 hours of injury.   Loss of fullness of the muscle, or area of muscle bulging in the case of a complete rupture.  CAUSES  A muscle strain occurs when a force is placed on the muscle or tendon that is greater than it can withstand. Common causes of injury include:  Strain from overuse or sudden increase in the frequency, duration, or intensity of activity.   Single violent blow or force to the back of the knee or the hamstring area of the thigh.  RISK INCREASES WITH:  Sports that require quick starts (sprinting, racquetball, tennis).   Sports that require jumping (basketball, volleyball).   Kicking sports and water skiing.   Contact sports (soccer,  football).   Poor strength and flexibility.   Failure to warm up properly before activity.   Previous thigh, knee, or pelvis injury.   Poor exercise technique.   Poor posture.  PREVENTION  Maintain physical fitness:   Strength, flexibility, and endurance.   Cardiovascular fitness.   Learn and use proper exercise technique and posture.   Wear proper fitted and padded protective equipment.  PROGNOSIS  If treated properly, hamstring strains are usually curable in 2 to 6 weeks. RELATED COMPLICATIONS   Longer healing time, if not properly treated or if not given adequate time to heal.   Chronically inflamed tendon, causing persistent pain with activity that may progress to constant pain.   Recurring symptoms, if activity is resumed too soon.   Vulnerable to repeated injury (in up to 25% of cases).  TREATMENT  Treatment first involves the use of ice and medication to help reduce pain and inflammation. It is also important to complete strengthening and stretching exercises, as well as modifying any activities that aggravate the symptoms. These exercises may be completed at home or with a therapist. Your caregiver may recommend the use of crutches to help reduce pain and discomfort, especially is the strain is severe enough to cause limping. If the tendon has pulled away from the bone, then surgery is necessary to reattach it. MEDICATION   If pain medicine is needed, nonsteroidal anti-inflammatory medicines (aspirin and ibuprofen), or other minor pain relievers (acetaminophen), are often advised.   Do not take pain medicine for 7 days before surgery.     Prescription pain relievers may be given if your caregiver thinks they are needed. Use only as directed and only as much as you need.   Corticosteroid injections may be recommended. However, these injections should only be used for serious cases, as they can only be given a certain number of times.   Ointments applied to the skin  may be beneficial.  HEAT AND COLD  Cold treatment (icing) relieves pain and reduces inflammation. Cold treatment should be applied for 10 to 15 minutes every 2 to 3 hours, and immediately after activity that aggravates your symptoms. Use ice packs or an ice massage.   Heat treatment may be used before performing the stretching and strengthening activities prescribed by your caregiver, physical therapist, or athletic trainer. Use a heat pack or a warm water soak.  SEEK MEDICAL CARE IF:   Symptoms get worse or do not improve in 2 weeks, despite treatment.   New, unexplained symptoms develop. (Drugs used in treatment may produce side effects.)  EXERCISES RANGE OF MOTION (ROM) AND STRETCHING EXERCISES - Hamstring Strain These exercises may help you when beginning to rehabilitate your injury. Your symptoms may go away with or without further involvement from your physician, physical therapist or athletic trainer. While completing these exercises, remember:   Restoring tissue flexibility helps normal motion to return to the joints. This allows healthier, less painful movement and activity.   An effective stretch should be held for at least 30 seconds.   A stretch should never be painful. You should only feel a gentle lengthening or release in the stretched tissue.  STRETCH - Hamstrings, Standing  Stand or sit, and extend your right / left leg, placing your foot on a chair or foot stool.   Keep a slight arch in your low back and your hips straight forward.   Lead with your chest, and lean forward at the waist until you feel a gentle stretch in the back of your right / left knee or thigh. (When done correctly, this exercise requires leaning only a small distance.)   Hold this position for __________ seconds.  Repeat __________ times. Complete this stretch __________ times per day. STRETCH - Hamstrings, Supine   Lie on your back. Loop a belt or towel over the ball of your right / left foot.     Straighten your right / left knee and slowly pull on the belt to raise your leg. Do not allow the right / left knee to bend. Keep your opposite leg flat on the floor.   Raise the leg until you feel a gentle stretch behind your right / left knee or thigh. Hold this position for __________ seconds.  Repeat __________ times. Complete this stretch __________ times per day.  STRETCH - Hamstrings, Doorway  Lie on your back with your right / left leg extended and resting on the wall, and the opposite leg flat on the ground through the door. Initially, position your bottom farther away from the wall.   Keep your right / left knee straight. If you feel a stretch behind your knee or thigh, hold this position for __________ seconds.   If you do not feel a stretch, scoot your bottom closer to the door and hold __________ seconds.  Repeat __________ times. Complete this stretch __________ times per day.  STRETCH - Hamstrings/Adductors, V-Sit   Sit on the floor with your legs extended in a large "V," keeping your knees straight.   With your head and chest upright, bend at   your waist reaching for your left foot to stretch your right thigh muscles.   You should feel a stretch in your right inner thigh. Hold for __________ seconds.   Return to the upright position to relax your leg muscles.   Continuing to keep your chest upright, bend straight forward at your waist to stretch your hamstrings.   You should feel a stretch behind both of your thighs and knees. Hold for __________ seconds.   Return to the upright position to relax your leg muscles.   With your head and chest upright, bend at your waist reaching for your right foot to stretch your left thigh muscles.   You should feel a stretch in your left inner thigh. Hold for __________ seconds.   Return to the upright position to relax your leg muscles.  Repeat __________ times. Complete this exercise __________ times per day.  STRENGTHENING  EXERCISES - Hamstring Strain These exercises may help you when beginning to rehabilitate your injury. They may resolve your symptoms with or without further involvement from your physician, physical therapist or athletic trainer. While completing these exercises, remember:   Muscles can gain both the endurance and the strength needed for everyday activities through controlled exercises.   Complete these exercises as instructed by your physician, physical therapist or athletic trainer. Increase the resistance and repetitions only as guided.   You may experience muscle soreness or fatigue, but the pain or discomfort you are trying to eliminate should never get worse during these exercises. If this pain does get worse, stop and make certain you are following the directions exactly. If the pain is still present after adjustments, discontinue the exercise until you can discuss the trouble with your clinician.  STRENGTH - Hip Extensors, Straight Leg Raises   Lie on your stomach on a firm surface.   Tense the muscles in your buttocks to lift your right / left leg about 4 inches. If you cannot lift your leg this high without arching your back, place a pillow under your hips.   Keep your knee straight. Hold for __________ seconds.   Slowly lower your leg to the starting position and allow it to relax completely before starting the next repetition.  Repeat __________ times. Complete this exercise __________ times per day.  STRENGTH - Hamstring, Isometrics   Lie on your back on a firm surface.   Bend your right / left knee approximately __________ degrees.   Dig your heel into the surface, as if you are trying to pull it toward your buttocks. Tighten the muscles in the back of your thighs to "dig" as hard as you can, without increasing any pain.   Hold this position for __________ seconds.   Release the tension gradually and allow your muscles to completely relax for __________ seconds between each  exercise.  Repeat __________ times. Complete this exercise __________ times per day.  STRENGTH - Hamstring, Curls   Lay on your stomach with your legs extended. (If you lay on a bed, your feet may hang over the edge.)   Tighten the muscles in the back of your thigh to bend your right / left knee up to 90 degrees. Keep your hips flat on the bed or floor.   Hold this position for __________ seconds.   Slowly lower your leg back to the starting position.  Repeat __________ times. Complete this exercise __________ times per day.  OPTIONAL ANKLE WEIGHTS: Begin with ____________________, but DO NOT exceed ____________________. Increase in 1 pound/0.5   kilogram increments. Document Released: 11/20/2005 Document Revised: 11/09/2011 Document Reviewed: 03/04/2009 [employer] Patient Information 2012 [employer]. 

## 2012-02-20 NOTE — Progress Notes (Signed)
Subjective:    Patient ID: [name], female    DOB: 19-Feb-1928, 78 y.o.   MRN: [id number]  HPI  An 78 year old female who had a left CVA causing right hemiparesis onset  in March. It was MCA distribution. She had right hemiparesis as well  as aphasia. She fell during the stroke had an intra-articular  bimalleolar ankle fracture placed in cast boot with limited  weightbearing. She has recovered from her ankle fracture. She has had  physical therapy and has now finished up. I last saw her on June 27, 2011. She has had another fall. She was not using her cane. She was  upgraded from a walker to a cane. She can walk 10 minutes at a time.  She was on her deck trying to bend over to pick up a flower and she  basically just sat down, her daughter saw her outside, daughter was  unusual, but once again no injury. The patient states she was not using  a cane. We reiterated she needs to use a cane at all times.  Pain Inventory Average Pain 0 Pain Right Now 0 My pain is N/A  In the last 24 hours, has pain interfered with the following? General activity 0 Relation with others 0 Enjoyment of life 0 What TIME of day is your pain at its worst? N/A Sleep (in general) Fair  Pain is worse with: N/A Pain improves with: N/A Relief from Meds: 0  Mobility use a cane ability to climb steps?  yes do you drive?  no  Function retired I need assistance with the following:  bathing, household duties and shopping  Neuro/Psych trouble walking depression  Prior Studies Any changes since last visit?  no  Physicians involved in your care Any changes since last visit?  no  Review of Systems  Constitutional: Negative.   HENT: Negative.   Eyes: Negative.   Respiratory: Negative.   Cardiovascular: Negative.   Gastrointestinal: Negative.   Genitourinary: Negative.   Musculoskeletal: Negative.   Skin: Negative.   Neurological: Negative.   Hematological: Negative.     Psychiatric/Behavioral: Negative.        Objective:   Physical Exam  Constitutional: She is oriented to person, place, and time. She appears well-developed and well-nourished.  Musculoskeletal:       Right shoulder: She exhibits decreased strength. She exhibits normal range of motion, no tenderness, no swelling, no effusion and no pain.       Left foot: Normal.       Pulses nl in foot Reduced external rotation as well as abduction in the right shoulder  Neurological: She is alert and oriented to person, place, and time.       Motor strength is 4/5 in the right deltoid, biceps, triceps, and grip 4/5 in the right hip flexor, extensor, ankle dorsiflexor 5/5 in the left upper and left lower extremity.  Psychiatric: She has a normal mood and affect.    speech shows no evidence of aphasia or apraxia     Assessment & Plan:  1. Left MCA distribution infarct with right-sided weakness. Her aphasia has resolved. Her main problem is a balance disorder and this is mainly when she is on uneven surfaces. I've asked her not to try to pick weeds when she is standing in her garden. She is to use her cane at all times. 2. Hamstring tightness but no other knee pathology will give her some exercises for this. 3. Hypertension follow up with primary  physician

## 2012-04-04 ENCOUNTER — Other Ambulatory Visit: Payer: Self-pay | Admitting: Internal Medicine

## 2012-04-05 MED ORDER — FA-PYRIDOXINE-CYANOCOBALAMIN 2.5-25-2 MG PO TABS: 1.0000 | ORAL_TABLET | Freq: Every day | ORAL | Status: DC

## 2012-04-24 ENCOUNTER — Encounter: Payer: Self-pay | Admitting: Internal Medicine

## 2012-04-24 ENCOUNTER — Ambulatory Visit (INDEPENDENT_AMBULATORY_CARE_PROVIDER_SITE_OTHER): Payer: Medicare Other | Admitting: Internal Medicine

## 2012-04-24 VITALS — BP 150/82 | HR 55 | Ht 61.0 in | Wt 132.8 lb

## 2012-04-24 DIAGNOSIS — I471 Supraventricular tachycardia: Secondary | ICD-10-CM

## 2012-04-24 DIAGNOSIS — I639 Cerebral infarction, unspecified: Secondary | ICD-10-CM

## 2012-04-24 DIAGNOSIS — I10 Essential (primary) hypertension: Secondary | ICD-10-CM

## 2012-04-24 DIAGNOSIS — I6350 Cerebral infarction due to unspecified occlusion or stenosis of unspecified cerebral artery: Secondary | ICD-10-CM

## 2012-04-24 DIAGNOSIS — I4891 Unspecified atrial fibrillation: Secondary | ICD-10-CM

## 2012-04-24 MED ORDER — LOSARTAN POTASSIUM 100 MG PO TABS: 100.0000 mg | ORAL_TABLET | Freq: Every day | ORAL | Status: DC

## 2012-04-24 MED ORDER — AMIODARONE HCL 200 MG PO TABS: 100.0000 mg | ORAL_TABLET | Freq: Two times a day (BID) | ORAL | Status: DC

## 2012-04-24 NOTE — Assessment & Plan Note (Signed)
She has had no recurrent symptomatic SVT since her ablation. She'll undergo her current medical therapy.

## 2012-04-24 NOTE — Assessment & Plan Note (Signed)
Her blood pressure is elevated today. We discussed the implications of this and the importance of reducing the pressure. Some of her blood pressure elevation is related to whitecoat hypertension. I will defer up titration of her medical therapy or a change in her medical therapy to her primary physician.

## 2012-04-24 NOTE — Assessment & Plan Note (Signed)
She will continue her current regimen of anticoagulation. She appears to be maintaining sinus rhythm.

## 2012-04-24 NOTE — Patient Instructions (Signed)
Your physician wants you to follow-up in: 12 months with Dr Court Joy will receive a reminder letter in the mail two months in advance. If you don't receive a letter, please call our office to schedule the follow-up appointment.   Your physician has recommended you make the following change in your medication:  1) Stop Diovan 2) Start Losartan 100mg  daily

## 2012-04-24 NOTE — Progress Notes (Signed)
HPI [name] returns today for followup. She is a very pleasant 78 year old woman with SVT, atrial fibrillation, and a stroke. She is status post catheter ablation of SVT and is been maintained in normal sinus rhythm on low-dose amiodarone. No recurrent thromboembolic symptoms. The patient continues to remain active and denies chest pain or shortness of breath. Allergies  Allergen Reactions  . Codeine     REACTION: fine rash     Current Outpatient Prescriptions  Medication Sig Dispense Refill  . acetaminophen (TYLENOL) 325 MG tablet Take 650 mg by mouth every 6 (six) hours as needed.        Marland Kitchen amiodarone (PACERONE) 200 MG tablet Take 0.5 tablets (100 mg total) by mouth 2 (two) times daily.  90 tablet  3  . amLODipine (NORVASC) 5 MG tablet Take 1 tablet (5 mg total) by mouth daily.  90 tablet  3  . Cholecalciferol (VITAMIN D) 1000 UNITS capsule Take 1,000 Units by mouth daily.        . folic acid-pyridoxine-cyancobalamin (FOLBIC) 2.5-25-2 MG TABS Take 1 tablet by mouth daily.  90 tablet  1  . nitroGLYCERIN (NITROSTAT) 0.4 MG SL tablet Place 0.4 mg under the tongue every 5 (five) minutes as needed.        . polyethylene glycol (MIRALAX / GLYCOLAX) packet Take 17 g by mouth as needed.       . simvastatin (ZOCOR) 10 MG tablet TAKE 1 TABLET AT BEDTIME  90 tablet  1  . XARELTO 15 MG TABS tablet TAKE 1 TABLET DAILY  90 tablet  1  . DISCONTD: amiodarone (PACERONE) 200 MG tablet Take 0.5 tablets (100 mg total) by mouth 2 (two) times daily.  90 tablet  1  . losartan (COZAAR) 100 MG tablet Take 1 tablet (100 mg total) by mouth daily.  90 tablet  3     Past Medical History  Diagnosis Date  . HTN (hypertension)   . History of colonic polyps   . Cervical arthritis   . Hip arthritis   . COLONIC POLYPS, HX OF 09/24/2007  . SUPRAVENTRICULAR ARRHYTHMIA 12/20/2010    s/p RFCA 05/2010-Dr. Ladona Ridgel  . Hyperlipidemia   . Atrial fibrillation   . History of stroke   . H/O: hysterectomy     fibroid tumors     ROS:   All systems reviewed and negative except as noted in the HPI.   Past Surgical History  Procedure Date  . Pilonidal cyst excision   . Breast lumpectomy     left  . Fb removal from popliteal fossae   . Intracapsular cataract extraction      Left eye Oct 18, 2009  right eye Jan '11 By Dr Doloris Hall  . Colon surgery 03/19/2009    colon cancer   . Transthoracic echocardiogram 2007     Family History  Problem Relation Age of Onset  . Heart attack Mother   . Stroke Mother   . Coronary artery disease Mother   . Heart disease Mother   . Cancer Father     Colon Cancer  . Diabetes Daughter   . Cancer Daughter     Leukemia     History   Social History  . Marital Status: Widowed    Spouse Name: N/A    Number of Children: 6  . Years of Education: N/A   Occupational History  . Retired    Social History Main Topics  . Smoking status: Never Smoker   . Smokeless tobacco:  Never Used  . Alcohol Use: Yes     rare  . Drug Use: No  . Sexually Active: Not Currently   Other Topics Concern  . Not on file   Social History Narrative   married - 1951- widowed 09-May-2023. 4-daughters, 1 died leukemia; 2 sons; 10 grandchildren; 5 great-grandchildren. I-ADLs. Retired. Lives in her own home but one of her children is with her all of the time. ACP- No CPR, no prolonged mechanical ventilation; not to be sustained long term with artificial feeding or hydration; no chronic HD; no other heroic or futile.      BP 150/82  Pulse 55  Ht 5\' 1"  (1.549 m)  Wt 132 lb 12.8 oz (60.238 kg)  BMI 25.09 kg/m2  Physical Exam:  Well appearing elderly woman, NAD HEENT: Unremarkable Neck:  No JVD, no thyromegally Lungs:  Clear with no wheezes, rales, or rhonchi. HEART:  Regular rate rhythm, no murmurs, no rubs, no clicks Abd:  soft, positive bowel sounds, no organomegally, no rebound, no guarding Ext:  2 plus pulses, no edema, no cyanosis, no clubbing Skin:  No rashes no nodules Neuro:  CN  II through XII intact, motor grossly intact  EKG Normal sinus rhythm  Assess/Plan:

## 2012-04-24 NOTE — Assessment & Plan Note (Signed)
She has had no recurrent symptomatic A. fib. She'll continue low-dose amiodarone. When I see her back in a year, I would anticipate reducing her dose.

## 2012-07-09 ENCOUNTER — Other Ambulatory Visit: Payer: Self-pay | Admitting: Physician Assistant

## 2012-07-09 NOTE — Telephone Encounter (Signed)
..   Requested Prescriptions   Pending Prescriptions Disp Refills  . amLODipine (NORVASC) 5 MG tablet [Pharmacy Med Name: AMLODIPINE BESYLATE TABS 5MG ] 90 tablet 2    Sig: TAKE 1 TABLET DAILY

## 2012-09-07 ENCOUNTER — Other Ambulatory Visit: Payer: Self-pay | Admitting: Internal Medicine

## 2012-10-05 ENCOUNTER — Other Ambulatory Visit: Payer: Self-pay | Admitting: Internal Medicine

## 2012-10-10 ENCOUNTER — Other Ambulatory Visit: Payer: Self-pay | Admitting: Internal Medicine

## 2012-10-10 ENCOUNTER — Ambulatory Visit (INDEPENDENT_AMBULATORY_CARE_PROVIDER_SITE_OTHER): Payer: Medicare Other | Admitting: *Deleted

## 2012-10-10 DIAGNOSIS — Z23 Encounter for immunization: Secondary | ICD-10-CM

## 2012-12-05 ENCOUNTER — Other Ambulatory Visit: Payer: Self-pay | Admitting: Internal Medicine

## 2012-12-17 ENCOUNTER — Ambulatory Visit (INDEPENDENT_AMBULATORY_CARE_PROVIDER_SITE_OTHER): Payer: Medicare Other | Admitting: Internal Medicine

## 2012-12-17 ENCOUNTER — Encounter: Payer: Self-pay | Admitting: Internal Medicine

## 2012-12-17 ENCOUNTER — Other Ambulatory Visit (INDEPENDENT_AMBULATORY_CARE_PROVIDER_SITE_OTHER): Payer: Medicare Other

## 2012-12-17 VITALS — BP 150/74 | HR 69 | Temp 97.5°F | Resp 10 | Ht 60.0 in | Wt 130.0 lb

## 2012-12-17 DIAGNOSIS — I4891 Unspecified atrial fibrillation: Secondary | ICD-10-CM

## 2012-12-17 DIAGNOSIS — I10 Essential (primary) hypertension: Secondary | ICD-10-CM

## 2012-12-17 DIAGNOSIS — I6350 Cerebral infarction due to unspecified occlusion or stenosis of unspecified cerebral artery: Secondary | ICD-10-CM

## 2012-12-17 DIAGNOSIS — Z0000 Encounter for general adult medical examination without abnormal findings: Secondary | ICD-10-CM

## 2012-12-17 DIAGNOSIS — E785 Hyperlipidemia, unspecified: Secondary | ICD-10-CM

## 2012-12-17 DIAGNOSIS — I639 Cerebral infarction, unspecified: Secondary | ICD-10-CM

## 2012-12-17 LAB — COMPREHENSIVE METABOLIC PANEL
ALT: 41 U/L — ABNORMAL HIGH (ref 0–35)
AST: 34 U/L (ref 0–37)
Albumin: 4.3 g/dL (ref 3.5–5.2)
Alkaline Phosphatase: 51 U/L (ref 39–117)
BUN: 17 mg/dL (ref 6–23)
CO2: 26 mEq/L (ref 19–32)
Calcium: 9.8 mg/dL (ref 8.4–10.5)
Chloride: 104 mEq/L (ref 96–112)
Creatinine, Ser: 1.3 mg/dL — ABNORMAL HIGH (ref 0.4–1.2)
GFR: 40.53 mL/min — ABNORMAL LOW (ref >60.00)
Glucose, Bld: 85 mg/dL (ref 70–99)
Potassium: 4.2 mEq/L (ref 3.5–5.1)
Sodium: 138 mEq/L (ref 135–145)
Total Bilirubin: 0.6 mg/dL (ref 0.3–1.2)
Total Protein: 7.6 g/dL (ref 6.0–8.3)

## 2012-12-17 LAB — CBC WITH DIFFERENTIAL/PLATELET
Basophils Absolute: 0 10*3/uL (ref 0.0–0.1)
Basophils Relative: 0.4 % (ref 0.0–3.0)
Eosinophils Absolute: 0 10*3/uL (ref 0.0–0.7)
Eosinophils Relative: 0.5 % (ref 0.0–5.0)
HCT: 42.5 % (ref 36.0–46.0)
Hemoglobin: 14 g/dL (ref 12.0–15.0)
Lymphocytes Relative: 17.8 % (ref 12.0–46.0)
Lymphs Abs: 1.8 10*3/uL (ref 0.7–4.0)
MCHC: 33 g/dL (ref 30.0–36.0)
MCV: 87.4 fl (ref 78.0–100.0)
Monocytes Absolute: 0.7 10*3/uL (ref 0.1–1.0)
Monocytes Relative: 6.7 % (ref 3.0–12.0)
Neutro Abs: 7.5 10*3/uL (ref 1.4–7.7)
Neutrophils Relative %: 74.6 % (ref 43.0–77.0)
Platelets: 203 10*3/uL (ref 150.0–400.0)
RBC: 4.86 Mil/uL (ref 3.87–5.11)
RDW: 15.7 % — ABNORMAL HIGH (ref 11.5–14.6)
WBC: 10 10*3/uL (ref 4.5–10.5)

## 2012-12-17 LAB — LIPID PANEL
Cholesterol: 196 mg/dL (ref 0–200)
HDL: 116.8 mg/dL (ref >39.00)
LDL Cholesterol: 67 mg/dL (ref 0–99)
Total CHOL/HDL Ratio: 2
Triglycerides: 62 mg/dL (ref 0.0–149.0)
VLDL: 12.4 mg/dL (ref 0.0–40.0)

## 2012-12-17 LAB — HEPATIC FUNCTION PANEL
ALT: 41 U/L — ABNORMAL HIGH (ref 0–35)
AST: 34 U/L (ref 0–37)
Albumin: 4.3 g/dL (ref 3.5–5.2)
Alkaline Phosphatase: 51 U/L (ref 39–117)
Bilirubin, Direct: 0.1 mg/dL (ref 0.0–0.3)
Total Bilirubin: 0.6 mg/dL (ref 0.3–1.2)
Total Protein: 7.6 g/dL (ref 6.0–8.3)

## 2012-12-17 NOTE — Progress Notes (Signed)
Subjective:    Patient ID: [name], female    DOB: 15-May-1928, 79 y.o.   MRN: [id number]  HPI The patient is here for annual Medicare wellness examination and management of other chronic and acute problems.   The risk factors are reflected in the social history.  The roster of all physicians providing medical care to patient - is listed in the Snapshot section of the chart.  Activities of daily living:  The patient is 100% inedpendent in all ADLs: dressing, toileting, feeding as well as independent mobility  Home safety : The patient has smoke detectors in the home. Falls - has fallen during the last year. Home is fall safe. They wear seatbelts. No firearms at home. There is no violence in the home.   There is no risks for hepatitis, STDs or HIV. There is no   history of blood transfusion. They have no travel history to infectious disease endemic areas of the world.  The patient has seen their dentist in the last six month. They have seen their eye doctor in the last year. They deny any hearing difficulty and have not had audiologic testing in the last year.    They do not  have excessive sun exposure. Discussed the need for sun protection: hats, long sleeves and use of sunscreen if there is significant sun exposure.   Diet: the importance of a healthy diet is discussed. They do have a healthy diet.  The patient has a regular exercise program.  The benefits of regular aerobic exercise were discussed.  Depression screen: there are no signs or vegative symptoms of depression- irritability, change in appetite, anhedonia, sadness/tearfullness.  Cognitive assessment: the patient manages all their financial and personal affairs and is actively engaged.   The following portions of the patient's history were reviewed and updated as appropriate: allergies, current medications, past family history, past medical history,  past surgical history, past social history  and problem  list.  Past Medical History  Diagnosis Date  . HTN (hypertension)   . History of colonic polyps   . Cervical arthritis   . Hip arthritis   . COLONIC POLYPS, HX OF 09/24/2007  . SUPRAVENTRICULAR ARRHYTHMIA 12/20/2010    s/p RFCA 05/2010-Dr. Ladona Ridgel  . Hyperlipidemia   . Atrial fibrillation   . History of stroke   . H/O: hysterectomy     fibroid tumors   Past Surgical History  Procedure Date  . Pilonidal cyst excision   . Breast lumpectomy     left  . Fb removal from popliteal fossae   . Intracapsular cataract extraction      Left eye Oct 18, 2009  right eye Jan '11 By Dr Doloris Hall  . Colon surgery 03/19/2009    colon cancer   . Transthoracic echocardiogram 2007   Family History  Problem Relation Age of Onset  . Heart attack Mother   . Stroke Mother   . Coronary artery disease Mother   . Heart disease Mother   . Cancer Father     Colon Cancer  . Diabetes Daughter   . Cancer Daughter     Leukemia   History   Social History  . Marital Status: Widowed    Spouse Name: N/A    Number of Children: 6  . Years of Education: N/A   Occupational History  . Retired    Social History Main Topics  . Smoking status: Never Smoker   . Smokeless tobacco: Never Used  . Alcohol Use:  Yes     Comment: rare  . Drug Use: No  . Sexually Active: Not Currently   Other Topics Concern  . Not on file   Social History Narrative   married - 1951- widowed 04/14/2023. 4-daughters, 1 died leukemia; 2 sons; 10 grandchildren; 5 great-grandchildren. I-ADLs. Retired. Lives in her own home but one of her children is with her all of the time. ACP- No CPR, no prolonged mechanical ventilation; not to be sustained long term with artificial feeding or hydration; no chronic HD; no other heroic or futile.     Current Outpatient Prescriptions on File Prior to Visit  Medication Sig Dispense Refill  . acetaminophen (TYLENOL) 325 MG tablet Take 650 mg by mouth every 6 (six) hours as needed.        Marland Kitchen  amiodarone (PACERONE) 200 MG tablet Take 0.5 tablets (100 mg total) by mouth 2 (two) times daily.  90 tablet  3  . amLODipine (NORVASC) 5 MG tablet TAKE 1 TABLET DAILY  90 tablet  2  . Cholecalciferol (VITAMIN D) 1000 UNITS capsule Take 1,000 Units by mouth daily.        . FOLBIC 2.5-25-2 MG TABS TAKE 1 TABLET DAILY  90 tablet  3  . losartan (COZAAR) 100 MG tablet Take 1 tablet (100 mg total) by mouth daily.  90 tablet  3  . nitroGLYCERIN (NITROSTAT) 0.4 MG SL tablet Place 0.4 mg under the tongue every 5 (five) minutes as needed.        . polyethylene glycol (MIRALAX / GLYCOLAX) packet Take 17 g by mouth as needed.       . simvastatin (ZOCOR) 10 MG tablet TAKE 1 TABLET AT BEDTIME  90 tablet  1  . XARELTO 15 MG TABS tablet TAKE 1 TABLET DAILY  90 tablet  3     Vision, hearing, body mass index were assessed and reviewed.   During the course of the visit the patient was educated and counseled about appropriate screening and preventive services including : fall prevention , diabetes screening, nutrition counseling, colorectal cancer screening, and recommended immunizations.    Review of Systems Constitutional:  Negative for fever, chills, activity change and unexpected weight change.  HEENT:  Negative for hearing loss, ear pain, congestion, neck stiffness and postnasal drip. Negative for sore throat or swallowing problems. Negative for dental complaints.   Eyes: Negative for vision loss or change in visual acuity.  Respiratory: Negative for chest tightness and wheezing. Negative for DOE.   Cardiovascular: Negative for chest pain or palpitations. No decreased exercise tolerance Gastrointestinal: No change in bowel habit. No bloating or gas. No reflux or indigestion Genitourinary: Negative for urgency, frequency, flank pain and difficulty urinating.  Musculoskeletal: Negative for myalgias, back pain, arthralgias and gait problem.  Neurological: Negative for dizziness, tremors, weakness and  headaches.  Hematological: Negative for adenopathy.  Psychiatric/Behavioral: Negative for behavioral problems and dysphoric mood.       Objective:   Physical Exam Filed Vitals:   12/17/12 1322  BP: 150/74  Pulse: 69  Temp: 97.5 F (36.4 C)  Resp: 10   Wt Readings from Last 3 Encounters:  12/17/12 130 lb 0.6 oz (58.986 kg)  04/24/12 132 lb 12.8 oz (60.238 kg)  02/20/12 131 lb (59.421 kg)   Gen'l: well nourished, well developed white Woman in no distress HEENT - [place]/AT, EACs/TMs normal, oropharynx with full dentures, no buccal, posterior pharynx clear, mucous membranes moist. C&S clear, PERRLA, fundi - normal Neck - supple, no  thyromegaly Nodes- negative submental, cervical, supraclavicular regions Chest - no deformity, no CVAT Lungs - clear without rales, wheezes. No increased work of breathing Breast - deferred (pt reports normal SBE) Cardiovascular - regular rate and rhythm, quiet precordium, no murmurs, rubs or gallops, 2+ radial, DP and PT pulses Abdomen - BS+ x 4, no HSM, no guarding or rebound or tenderness Pelvic - deferred  Rectal - deferred  Extremities - no clubbing, cyanosis, edema or deformity.  Neuro - A&O x 3, CN II-XII normal, motor strength 3+/5 proximal right UE, 3+/5 proximal right LE otherwise normal, DTRs 2+ and symmetrical biceps, radial, and patellar tendons. Cerebellar - no tremor, no rigidity, fluid movement and gait with right leg weakness. Derm - Head, neck, back, abdomen and extremities without suspicious lesions  Lab Results  Component Value Date   WBC 10.0 12/17/2012   HGB 14.0 12/17/2012   HCT 42.5 12/17/2012   PLT 203.0 12/17/2012   GLUCOSE 85 12/17/2012   CHOL 196 12/17/2012   TRIG 62.0 12/17/2012   HDL 116.80 12/17/2012   LDLDIRECT 60.2 11/30/2011   LDLCALC 67 12/17/2012   ALT 41* 12/17/2012   ALT 41* 12/17/2012   AST 34 12/17/2012   AST 34 12/17/2012   NA 138 12/17/2012   K 4.2 12/17/2012   CL 104 12/17/2012   CREATININE 1.3* 12/17/2012   BUN 17  12/17/2012   CO2 26 12/17/2012   TSH 1.162 05/08/2011   INR 1.07 05/22/2011         Assessment & Plan:

## 2012-12-17 NOTE — Patient Instructions (Addendum)
Thanks for coming to see me.  You look like you are doing fine with a normal exam. Will check routine labs today.  Please consider a seniors exercise program of some type: a focus on "flex/stretch" as well as getting your heart rate up  Please continue all your present medications/.  A full report with labs will follow and you may activate a MyChart account for more rapid access to labs, etc.

## 2012-12-18 NOTE — Assessment & Plan Note (Signed)
Doing great - minimal proximal right UE/LE weakness but independent in ADLS. Risk reduction well managed.  Plan  continue risk reduction.  On Xarelto for PAF  Advised to use 3 wheel large tire rolling walker especially outside.

## 2012-12-18 NOTE — Assessment & Plan Note (Signed)
Great control on low dose simvastatin. No adverse affects. Liver functions normal.  Plan continue present medication.

## 2012-12-18 NOTE — Assessment & Plan Note (Signed)
BP Readings from Last 3 Encounters:  12/17/12 150/74  04/24/12 150/82  02/20/12 170/76   Mildly elevated but in her age and with history of CVA will tolerate this level of BP, avoid watershed risk.

## 2012-12-18 NOTE — Assessment & Plan Note (Signed)
H/o PAF currently doing well. On exam NSR rate controlled.  Plan - continue Xarelto

## 2012-12-18 NOTE — Assessment & Plan Note (Addendum)
Interval history is benign. Physical exam, sans breast, is normal except for minor residual weakness proximal RUE, proximal RLE and decreased ROM right shoulder. Lab results are in normal range. She had colorectal cancer screening in '09 and does not need further screening. She does breast self-exam. Immunizations are up to date. ACP discussed and clarified.  In summary - a delightful woman who is medically stable. She is advised to use 3 wheel rolling walker when outside to avoid falls. She will return as needed or 6 months.

## 2013-02-21 ENCOUNTER — Telehealth: Payer: Self-pay | Admitting: Internal Medicine

## 2013-02-21 NOTE — Telephone Encounter (Signed)
Spoke with daughter and this sounds like musculoskeletal pain as it hurts when she moves around.  It does not cause her discomfort when she is still.  She is going to take some Tylenol and put heat on the area to see if this helps.  She notes that she has had a cough that is new also.  This could be the problem, her cough has been going on for weeks it sounds like.  i have asked her to get in touch with her PCP if this taking the Tylenol and heat does not help

## 2013-02-21 NOTE — Telephone Encounter (Signed)
Plz return call to patient daughter Gerome Apley [phone]  Patient has not felt well over the last few days with chest  And back discomfort, plz return call to patient daughter

## 2013-04-16 ENCOUNTER — Other Ambulatory Visit: Payer: Self-pay | Admitting: *Deleted

## 2013-04-16 MED ORDER — AMLODIPINE BESYLATE 5 MG PO TABS: ORAL_TABLET | ORAL | Status: DC

## 2013-05-01 ENCOUNTER — Other Ambulatory Visit: Payer: Self-pay | Admitting: Internal Medicine

## 2013-06-07 ENCOUNTER — Other Ambulatory Visit: Payer: Self-pay | Admitting: Internal Medicine

## 2013-07-10 ENCOUNTER — Other Ambulatory Visit: Payer: Self-pay | Admitting: Internal Medicine

## 2013-09-12 ENCOUNTER — Other Ambulatory Visit: Payer: Self-pay | Admitting: Internal Medicine

## 2013-10-19 ENCOUNTER — Other Ambulatory Visit: Payer: Self-pay | Admitting: Internal Medicine

## 2013-10-24 ENCOUNTER — Encounter: Payer: Self-pay | Admitting: Nurse Practitioner

## 2013-10-24 ENCOUNTER — Encounter: Payer: Medicare Other | Admitting: Nurse Practitioner

## 2013-10-24 ENCOUNTER — Ambulatory Visit (INDEPENDENT_AMBULATORY_CARE_PROVIDER_SITE_OTHER): Payer: Medicare Other | Admitting: Nurse Practitioner

## 2013-10-24 VITALS — BP 140/70 | HR 60 | Temp 97.5°F | Ht 60.0 in | Wt 128.7 lb

## 2013-10-24 DIAGNOSIS — Z0000 Encounter for general adult medical examination without abnormal findings: Secondary | ICD-10-CM

## 2013-10-24 NOTE — Patient Instructions (Signed)
Today you had a [employer] Visit. We have updated your medical history and reviewed risks for disease. Your medical plan includes yearly physicals and visits every 6 months to monitor blood pressure and lipids.Pleasure to meet you!

## 2013-10-24 NOTE — Progress Notes (Signed)
Pre-visit discussion using our clinic review tool. No additional management support is needed unless otherwise documented below in the visit note.  

## 2013-10-26 NOTE — Progress Notes (Signed)
Subjective:     [name] is a 79 y.o. female and is here for a Medicare Annual Wellness Visit. She is treated for a-fib, has some R-sided weakness, balance issues, and mild aphasia secondary to stroke. She is independent with the exception of needing assistance getting in & out of tub. She walks with a cane; lives with her son. She is accompanied by her daughter today.  Since her last PCP visit, she had basal cell carcinoma removed form her face. The patient reports no problems.  History   Social History  . Marital Status: Widowed    Spouse Name: N/A    Number of Children: 6  . Years of Education: N/A   Occupational History  . Retired    Social History Main Topics  . Smoking status: Never Smoker   . Smokeless tobacco: Never Used  . Alcohol Use: Yes     Comment: rare  . Drug Use: No  . Sexual Activity: Not Currently   Other Topics Concern  . Not on file   Social History Narrative   married - 1951- widowed 04-23-2023. 4-daughters, 1 died leukemia; 2 sons; 10 grandchildren; 5 great-grandchildren. I-ADLs. Retired. Lives in her own home but one of her children is with her all of the time. ACP- No CPR, no prolonged mechanical ventilation; not to be sustained long term with artificial feeding or hydration; no chronic HD; no other heroic or futile.          Health Maintenance  Topic Date Due  . Influenza Vaccine  07/04/2014  . Colonoscopy  03/30/2018  . Tetanus/tdap  11/10/2019  . Pneumococcal Polysaccharide Vaccine Age 67 And Over  Addressed  . Zostavax  Completed    The following portions of the patient's history were reviewed and updated as appropriate: allergies, current medications, past family history, past medical history, past social history, past surgical history and problem list.  Review of Systems Constitutional: negative for chills, fatigue, fevers, night sweats and weight loss Cardiovascular: negative for chest pain, chest pressure/discomfort, irregular heart beat,  lower extremity edema and near-syncope Musculoskeletal:positive for R sided weakness, looses balance at times, negative for back pain, myalgias and stiff joints Neurological: positive for coordination problems and weakness, negative for headaches, memory problems, paresthesia, seizures and tremors Behavioral/Psych: negative for anxiety, decreased appetite, depression, excessive alcohol consumption, illegal drug usage, sleep disturbance and tobacco use   Objective:    BP 140/70  Pulse 60  Temp(Src) 97.5 F (36.4 C) (Oral)  Ht 5' (1.524 m)  Wt 128 lb 11.2 oz (58.378 kg)  BMI 25.14 kg/m2  SpO2 96% General appearance: alert, cooperative, appears stated age and walks with cane Head: Normocephalic, without obvious abnormality, atraumatic Eyes: negative findings: lids and lashes normal and conjunctivae and sclerae normal    Assessment & Plan:     Medicare Annual Wellness Exam: Medical & family Hx-basal cell ca removed from face PHQ9 score-0. No depression. Fall/ home safety/ ADLs/sensory impairments-high risk falls due to Hx & motor deficits. BMI & VS stab.le VS. BMI 25 Medical providers- [facility]-cardio; Hyacinth Meeker- eyes; [facility]-derm Mini Mental Status Exam-29-speech deficit, unable to enunciate "No ifs and or butts" 5 year plan- yearly CPE. Pt declines bone density as she does not wish to take meds for osteoporosis. F/u w/cardio as indicated to monitor a-fib, anti-coag; q35m w/PCP to monitor HTN. Risk Factors- cardio-age; injury-motor deficit secondary to stroke; bleeding-anti-coag meds.          See After Visit Summary for Counseling Recommendations

## 2013-10-28 ENCOUNTER — Other Ambulatory Visit: Payer: Self-pay

## 2013-10-28 MED ORDER — RIVAROXABAN 15 MG PO TABS: ORAL_TABLET | ORAL | Status: DC

## 2013-11-03 ENCOUNTER — Other Ambulatory Visit: Payer: Self-pay | Admitting: Internal Medicine

## 2013-11-04 ENCOUNTER — Telehealth: Payer: Self-pay | Admitting: Internal Medicine

## 2013-11-04 NOTE — Telephone Encounter (Signed)
New Problem    Please give [name] a call back about pt's medication refill. / appt

## 2013-11-04 NOTE — Telephone Encounter (Signed)
Gave an appointment for 11/11/13

## 2013-11-11 ENCOUNTER — Encounter: Payer: Self-pay | Admitting: Internal Medicine

## 2013-11-11 ENCOUNTER — Ambulatory Visit (INDEPENDENT_AMBULATORY_CARE_PROVIDER_SITE_OTHER): Payer: Medicare Other | Admitting: Internal Medicine

## 2013-11-11 VITALS — BP 152/88 | HR 55 | Ht 62.0 in | Wt 127.8 lb

## 2013-11-11 DIAGNOSIS — I4891 Unspecified atrial fibrillation: Secondary | ICD-10-CM

## 2013-11-11 DIAGNOSIS — I10 Essential (primary) hypertension: Secondary | ICD-10-CM

## 2013-11-11 DIAGNOSIS — I471 Supraventricular tachycardia: Secondary | ICD-10-CM

## 2013-11-11 MED ORDER — AMIODARONE HCL 200 MG PO TABS: ORAL_TABLET | ORAL | Status: DC

## 2013-11-11 NOTE — Progress Notes (Signed)
HPI Mrs. Polcyn returns today for followup. She is a very pleasant 79 year old woman with a history of SVT status post ablation, atrial fibrillation status post stroke now on systemic anticoagulation with Xarelto, hypertension, and dyslipidemia. In the interim, she has done well. She has minimal unsteadiness with her right leg and uses a cane on a level ground.  She denies syncope and has had no palpitations or symptomatic atrial fibrillation. She is maintained on chronic anticoagulation. Allergies  Allergen Reactions  . Codeine     REACTION: fine rash     Current Outpatient Prescriptions  Medication Sig Dispense Refill  . acetaminophen (TYLENOL) 325 MG tablet Take 650 mg by mouth every 6 (six) hours as needed.        Marland Kitchen amiodarone (PACERONE) 200 MG tablet TAKE ONE-HALF (1/2) TABLET TWICE A DAY (NEED TO SCHEDULE APPOINTMENT FOR FUTURE REFILLS- PH: [phone])  90 tablet  0  . amLODipine (NORVASC) 5 MG tablet TAKE 1 TABLET DAILY  90 tablet  2  . Cholecalciferol (VITAMIN D) 1000 UNITS capsule Take 1,000 Units by mouth daily.        . FOLBIC 2.5-25-2 MG TABS TAKE 1 TABLET DAILY  90 tablet  2  . losartan (COZAAR) 100 MG tablet TAKE 1 TABLET DAILY  90 tablet  2  . nitroGLYCERIN (NITROSTAT) 0.4 MG SL tablet Place 0.4 mg under the tongue every 5 (five) minutes as needed.        . polyethylene glycol (MIRALAX / GLYCOLAX) packet Take 17 g by mouth as needed.       . Rivaroxaban (XARELTO) 15 MG TABS tablet TAKE 1 TABLET DAILY  30 tablet  0  . simvastatin (ZOCOR) 10 MG tablet TAKE 1 TABLET AT BEDTIME  90 tablet  1   No current facility-administered medications for this visit.     Past Medical History  Diagnosis Date  . HTN (hypertension)   . History of colonic polyps   . Cervical arthritis   . Hip arthritis   . COLONIC POLYPS, HX OF 09/24/2007  . SUPRAVENTRICULAR ARRHYTHMIA 12/20/2010    s/p RFCA 05/2010-Dr. Ladona Ridgel  . Hyperlipidemia   . Atrial fibrillation   . History of stroke     . H/O: hysterectomy     fibroid tumors    ROS:   All systems reviewed and negative except as noted in the HPI.   Past Surgical History  Procedure Laterality Date  . Pilonidal cyst excision    . Breast lumpectomy      left  . Fb removal from popliteal fossae    . Intracapsular cataract extraction       Left eye Oct 18, 2009  right eye Jan '11 By Dr Doloris Hall  . Colon surgery  03/19/2009    colon cancer   . Transthoracic echocardiogram  2007     Family History  Problem Relation Age of Onset  . Heart attack Mother   . Stroke Mother   . Coronary artery disease Mother   . Heart disease Mother   . Cancer Father     Colon Cancer  . Diabetes Daughter   . Cancer Daughter     Leukemia     History   Social History  . Marital Status: Widowed    Spouse Name: N/A    Number of Children: 6  . Years of Education: N/A   Occupational History  . Retired    Social History Main Topics  . Smoking status:  Never Smoker   . Smokeless tobacco: Never Used  . Alcohol Use: Yes     Comment: rare  . Drug Use: No  . Sexual Activity: Not Currently   Other Topics Concern  . Not on file   Social History Narrative   married - 1951- widowed 05/08/23. 4-daughters, 1 died leukemia; 2 sons; 10 grandchildren; 5 great-grandchildren. I-ADLs. Retired. Lives in her own home but one of her children is with her all of the time. ACP- No CPR, no prolonged mechanical ventilation; not to be sustained long term with artificial feeding or hydration; no chronic HD; no other heroic or futile.            Pulse 55  Ht 5\' 2"  (1.575 m)  Wt 127 lb 12.8 oz (57.97 kg)  BMI 23.37 kg/m2  Physical Exam:  Well appearing elderly woman,NAD HEENT: Unremarkable Neck:  No JVD, no thyromegally Back:  No CVA tenderness Lungs:  Clear with no wheezes HEART:  Regular rate rhythm, no murmurs, no rubs, no clicks Abd:  soft, positive bowel sounds, no organomegally, no rebound, no guarding Ext:  2 plus pulses, no  edema, no cyanosis, no clubbing Skin:  No rashes no nodules Neuro:  CN II through XII intact, motor grossly intact  EKG - sinus bradycardia with first degree AV block, left anterior fascicular block, and nonspecific T wave abnormality   Assess/Plan:

## 2013-11-11 NOTE — Assessment & Plan Note (Signed)
She has had no recurrent tachypalpitations. No change in medical therapy.

## 2013-11-11 NOTE — Patient Instructions (Signed)
Your physician wants you to follow-up in:12 months with Dr Court Joy will receive a reminder letter in the mail two months in advance. If you don't receive a letter, please call our office to schedule the follow-up appointment.  Your physician has recommended you make the following change in your medication:  1) Decrease Amiodarone to ---1/2 tablet twice daily Mon-Fri and 1/2 tablet daily on Sat and Sun 2) Stop Zocor

## 2013-11-11 NOTE — Assessment & Plan Note (Signed)
She is maintaining sinus rhythm very nicely. We will reduce her dose of amiodarone to 100 mg on Saturday and Sunday and 200 mg Monday through Friday. she will continue systemic anticoagulation

## 2013-11-11 NOTE — Assessment & Plan Note (Signed)
Her blood pressure is well compensated. She'll continue her current medical therapy. She notes that her home her blood pressure is improved.

## 2013-11-13 ENCOUNTER — Other Ambulatory Visit: Payer: Self-pay | Admitting: *Deleted

## 2013-11-13 MED ORDER — RIVAROXABAN 15 MG PO TABS: ORAL_TABLET | ORAL | Status: DC

## 2013-12-18 ENCOUNTER — Encounter: Payer: Self-pay | Admitting: Internal Medicine

## 2013-12-18 ENCOUNTER — Other Ambulatory Visit (INDEPENDENT_AMBULATORY_CARE_PROVIDER_SITE_OTHER): Payer: Medicare Other

## 2013-12-18 ENCOUNTER — Ambulatory Visit (INDEPENDENT_AMBULATORY_CARE_PROVIDER_SITE_OTHER): Payer: Medicare Other | Admitting: Internal Medicine

## 2013-12-18 VITALS — BP 160/62 | HR 51 | Temp 97.2°F | Wt 132.0 lb

## 2013-12-18 DIAGNOSIS — IMO0002 Reserved for concepts with insufficient information to code with codable children: Secondary | ICD-10-CM

## 2013-12-18 DIAGNOSIS — E785 Hyperlipidemia, unspecified: Secondary | ICD-10-CM

## 2013-12-18 DIAGNOSIS — Z23 Encounter for immunization: Secondary | ICD-10-CM

## 2013-12-18 DIAGNOSIS — I10 Essential (primary) hypertension: Secondary | ICD-10-CM

## 2013-12-18 DIAGNOSIS — I4891 Unspecified atrial fibrillation: Secondary | ICD-10-CM

## 2013-12-18 DIAGNOSIS — I6350 Cerebral infarction due to unspecified occlusion or stenosis of unspecified cerebral artery: Secondary | ICD-10-CM

## 2013-12-18 DIAGNOSIS — T887XXA Unspecified adverse effect of drug or medicament, initial encounter: Secondary | ICD-10-CM

## 2013-12-18 DIAGNOSIS — Z0000 Encounter for general adult medical examination without abnormal findings: Secondary | ICD-10-CM

## 2013-12-18 DIAGNOSIS — I639 Cerebral infarction, unspecified: Secondary | ICD-10-CM

## 2013-12-18 LAB — BASIC METABOLIC PANEL
BUN: 14 mg/dL (ref 6–23)
CO2: 26 mEq/L (ref 19–32)
Calcium: 9.8 mg/dL (ref 8.4–10.5)
Chloride: 104 mEq/L (ref 96–112)
Creatinine, Ser: 1.1 mg/dL (ref 0.4–1.2)
GFR: 51.41 mL/min — ABNORMAL LOW (ref >60.00)
Glucose, Bld: 89 mg/dL (ref 70–99)
Potassium: 4.2 mEq/L (ref 3.5–5.1)
Sodium: 139 mEq/L (ref 135–145)

## 2013-12-18 LAB — TSH: TSH: 1.51 u[IU]/mL (ref 0.35–5.50)

## 2013-12-18 LAB — T4, FREE: Free T4: 1.38 ng/dL (ref 0.60–1.60)

## 2013-12-18 LAB — HEMOGLOBIN AND HEMATOCRIT, BLOOD
HCT: 40.3 % (ref 36.0–46.0)
Hemoglobin: 13.5 g/dL (ref 12.0–15.0)

## 2013-12-18 NOTE — Progress Notes (Signed)
Pre visit review using our clinic review tool, if applicable. No additional management support is needed unless otherwise documented below in the visit note. 

## 2013-12-18 NOTE — Patient Instructions (Signed)
Thank you for coming in to see me.  Your physical exam is fine.  Labs are ordered and results will be posted to MyChart or snail mail.  Advanced Care Planning - discussed. Reviewed current statement in social history - no change. You have an "out of facility order"  And is too healthy to complete the MOST form according to statute.  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 Thank you.

## 2013-12-18 NOTE — Progress Notes (Signed)
Subjective:    Patient ID: [name], female    DOB: 05/11/1928, 80 y.o.   MRN: [id number]  HPI [name] presents today for general and complete medical follow-up. She had a medicare wellness exam Jan '14 and a medicare wellness exam in Nov '14. This latter was reviewed for completeness and in the interval there has been no change in regard to the medicare wellness benchmarks which she completely meets including safety and wellness.   From a medical perspective she has been doing OK: no major medical illness or hospitalization, no major surgery but she did have a squamous cell carcinoma excision with Mohs technique left cheek.   She remains independent in all activities of daily living. Home safety is up to date including smoke alarms. She is current with dental and eye care. She has has a healthy diet.and does exercise exercise on a regular basis. Her immunizations are up to date except for Prevnar.   Past Medical History  Diagnosis Date  . HTN (hypertension)   . History of colonic polyps   . Cervical arthritis   . Hip arthritis   . COLONIC POLYPS, HX OF 09/24/2007  . SUPRAVENTRICULAR ARRHYTHMIA 12/20/2010    s/p RFCA 05/2010-Dr. Lovena Le  . Hyperlipidemia   . Atrial fibrillation   . History of stroke   . H/O: hysterectomy     fibroid tumors   Past Surgical History  Procedure Laterality Date  . Pilonidal cyst excision    . Breast lumpectomy      left  . Fb removal from popliteal fossae    . Intracapsular cataract extraction       Left eye Oct 18, 2009  right eye Jan '11 By Dr Era Skeen  . Colon surgery  03/19/2009    colon cancer   . Transthoracic echocardiogram  2006-04-05   Family History  Problem Relation Age of Onset  . Heart attack Mother   . Stroke Mother   . Coronary artery disease Mother   . Heart disease Mother   . Cancer Father     Colon Cancer  . Diabetes Daughter   . Cancer Daughter     Leukemia   History   Social History  . Marital Status:  Widowed    Spouse Name: N/A    Number of Children: 6  . Years of Education: N/A   Occupational History  . Retired    Social History Main Topics  . Smoking status: Never Smoker   . Smokeless tobacco: Never Used  . Alcohol Use: Yes     Comment: rare  . Drug Use: No  . Sexual Activity: Not Currently   Other Topics Concern  . Not on file   Social History Narrative   married - 04/05/1950- widowed 04-06-2023. 4-daughters, 1 died leukemia; 2 sons; 10 grandchildren; 5 great-grandchildren. I-ADLs. Retired. Lives in her own home but one of her children is with her all of the time. ACP- No CPR, no prolonged mechanical ventilation; not to be sustained long term with artificial feeding or hydration; no chronic HD; no other heroic or futile.           Current Outpatient Prescriptions on File Prior to Visit  Medication Sig Dispense Refill  . acetaminophen (TYLENOL) 325 MG tablet Take 650 mg by mouth every 6 (six) hours as needed.        Marland Kitchen amiodarone (PACERONE) 200 MG tablet 1/2 tablet bid Mon-Fri and 1/2 tablet daily on Sat and Sun  30  tablet  11  . amLODipine (NORVASC) 5 MG tablet TAKE 1 TABLET DAILY  90 tablet  2  . FOLBIC 2.5-25-2 MG TABS TAKE 1 TABLET DAILY  90 tablet  2  . losartan (COZAAR) 100 MG tablet TAKE 1 TABLET DAILY  90 tablet  2  . nitroGLYCERIN (NITROSTAT) 0.4 MG SL tablet Place 0.4 mg under the tongue every 5 (five) minutes as needed.        . polyethylene glycol (MIRALAX / GLYCOLAX) packet Take 17 g by mouth as needed.       . Rivaroxaban (XARELTO) 15 MG TABS tablet TAKE 1 TABLET DAILY  90 tablet  1   No current facility-administered medications on file prior to visit.    Review of Systems Constitutional:  Negative for fever, chills, activity change and unexpected weight change.  HEENT:  Negative for hearing loss, ear pain, congestion, neck stiffness and postnasal drip. Negative for sore throat or swallowing problems. Negative for dental complaints.   Eyes: Negative for vision loss or  change in visual acuity.  Respiratory: Negative for chest tightness and wheezing. Negative for DOE.   Cardiovascular: Negative for chest pain or palpitations. No decreased exercise tolerance Gastrointestinal: No change in bowel habit. No bloating or gas. No reflux or indigestion Genitourinary: Negative for urgency, frequency, flank pain and difficulty urinating.  Musculoskeletal: Negative for myalgias, back pain, arthralgias and gait problem.  Neurological: Negative for dizziness, tremors, weakness and headaches.  Hematological: Negative for adenopathy.  Psychiatric/Behavioral: Negative for behavioral problems and dysphoric mood.       Objective:   Physical Exam Filed Vitals:   12/18/13 1503  BP: 160/62  Pulse: 51  Temp: 97.2 F (36.2 C)   Wt Readings from Last 3 Encounters:  12/18/13 132 lb (59.875 kg)  11/11/13 127 lb 12.8 oz (57.97 kg)  10/24/13 128 lb 11.2 oz (58.378 kg)   BP Readings from Last 3 Encounters:  12/18/13 160/62  11/11/13 152/88  10/24/13 140/70    Gen'l: well nourished, well developed Woman in no distress HEENT - [place]/AT, EACs/TMs normal, oropharynx with native dentition in good condition, no buccal or palatal lesions, posterior pharynx clear, mucous membranes moist. C&S clear, PERRLA, fundi - normal Neck - supple, no thyromegaly Nodes- negative submental, cervical, supraclavicular regions Chest - no deformity, no CVAT Lungs - clear without rales, wheezes. No increased work of breathing Breast - deferred Cardiovascular - regular rate and rhythm, quiet precordium, no murmurs, rubs or gallops, 2+ radial, DP and PT pulses Abdomen - BS+ x 4, no HSM, no guarding or rebound or tenderness Pelvic - deferred  Rectal - deferred  Extremities - no clubbing, cyanosis, edema or deformity.  Neuro - A&O x 3, CN II-XII normal, motor strength normal and equal, DTRs 2+ and symmetrical biceps, radial, and patellar tendons. Cerebellar - no tremor, no rigidity, fluid movement  and normal gait. Derm - Head, neck, back, abdomen and extremities without suspicious lesions  Recent Results (from the past 2160 hour(s))  BASIC METABOLIC PANEL     Status: Abnormal   Collection Time    12/18/13  4:19 PM      Result Value Range   Sodium 139  135 - 145 mEq/L   Potassium 4.2  3.5 - 5.1 mEq/L   Chloride 104  96 - 112 mEq/L   CO2 26  19 - 32 mEq/L   Glucose, Bld 89  70 - 99 mg/dL   BUN 14  6 - 23 mg/dL   Creatinine,  Ser 1.1  0.4 - 1.2 mg/dL   Calcium 9.8  8.4 - 10.5 mg/dL   GFR 51.41 (*) >60.00 mL/min  HEMOGLOBIN AND HEMATOCRIT, BLOOD     Status: None   Collection Time    12/18/13  4:19 PM      Result Value Range   Hemoglobin 13.5  12.0 - 15.0 g/dL   HCT 40.3  36.0 - 46.0 %  TSH     Status: None   Collection Time    12/18/13  4:19 PM      Result Value Range   TSH 1.51  0.35 - 5.50 uIU/mL  T4, FREE     Status: None   Collection Time    12/18/13  4:19 PM      Result Value Range   Free T4 1.38  0.60 - 1.60 ng/dL         Assessment & Plan:

## 2013-12-20 NOTE — Assessment & Plan Note (Signed)
BP Readings from Last 3 Encounters:  12/18/13 160/62  11/11/13 152/88  10/24/13 140/70   Borderline control at today's visit. She reports better control at home.  Plan Continue present medication  Monitor BP at home and report back.

## 2013-12-20 NOTE — Assessment & Plan Note (Addendum)
Zocor has been taken off her list. Reviewed Dr. Tanna Furry last note - at that time zocor was stopped but is not addressed as to why.  Plan  Will ask Dr. Lovena Le

## 2013-12-20 NOTE — Assessment & Plan Note (Signed)
Stable and doing well. She is on NOAC. Will restart antilipemic medication if cleared by Dr. Lovena Le.

## 2013-12-20 NOTE — Assessment & Plan Note (Signed)
Interval history is unremarkable and, in fact, she is doing better!. Limited physical exam is normal. Reviewed labs - ok. She had colonoscopy in '09 and has aged out of further screening. She has aged out of need for mammography (USPHCTF recommendations). Immunizations are up to date and has been given Prevnar today.  In summary A delightful woman who is doing very well and is medically stable.

## 2013-12-20 NOTE — Assessment & Plan Note (Signed)
Stable with controlled heart rate. No complaints of palpations or near syncope, no c/o chest pain. No bleeding problems.  Plan Continue present medications

## 2013-12-21 ENCOUNTER — Encounter: Payer: Self-pay | Admitting: Internal Medicine

## 2014-02-10 ENCOUNTER — Other Ambulatory Visit: Payer: Self-pay | Admitting: Internal Medicine

## 2014-02-18 ENCOUNTER — Other Ambulatory Visit: Payer: Self-pay

## 2014-02-18 DIAGNOSIS — I4891 Unspecified atrial fibrillation: Secondary | ICD-10-CM

## 2014-02-18 MED ORDER — AMIODARONE HCL 200 MG PO TABS: ORAL_TABLET | ORAL | Status: DC

## 2014-02-18 MED ORDER — LOSARTAN POTASSIUM 100 MG PO TABS: 100.0000 mg | ORAL_TABLET | Freq: Every day | ORAL | Status: DC

## 2014-02-18 MED ORDER — LOSARTAN POTASSIUM 100 MG PO TABS: ORAL_TABLET | ORAL | Status: DC

## 2014-02-19 ENCOUNTER — Telehealth: Payer: Self-pay | Admitting: *Deleted

## 2014-02-19 NOTE — Telephone Encounter (Signed)
PA to [facility] for xarelto

## 2014-02-23 NOTE — Telephone Encounter (Signed)
[facility] approved through 02/20/2015.

## 2014-05-26 ENCOUNTER — Other Ambulatory Visit: Payer: Self-pay | Admitting: Internal Medicine

## 2014-05-27 ENCOUNTER — Other Ambulatory Visit: Payer: Self-pay

## 2014-08-19 ENCOUNTER — Other Ambulatory Visit: Payer: Self-pay | Admitting: Internal Medicine

## 2014-08-25 ENCOUNTER — Telehealth: Payer: Self-pay

## 2014-08-25 MED ORDER — FA-PYRIDOXINE-CYANOCOBALAMIN 2.5-25-2 MG PO TABS: ORAL_TABLET | ORAL | Status: DC

## 2014-08-25 NOTE — Telephone Encounter (Signed)
Refill request for folbic rabs qty 90 2 rfs, rx sent in, pt needs to schedule an appt w/ a new pcp in order to receive further refills.

## 2014-08-26 ENCOUNTER — Other Ambulatory Visit: Payer: Self-pay | Admitting: Internal Medicine

## 2014-10-24 ENCOUNTER — Emergency Department (HOSPITAL_COMMUNITY)
Admission: EM | Admit: 2014-10-24 | Discharge: 2014-10-24 | Disposition: A | Discharge status: Home / Self Care | Payer: Medicare Other | Attending: Emergency Medicine | Admitting: Emergency Medicine

## 2014-10-24 ENCOUNTER — Emergency Department (HOSPITAL_COMMUNITY): Payer: Medicare Other

## 2014-10-24 ENCOUNTER — Encounter (HOSPITAL_COMMUNITY): Payer: Self-pay | Admitting: *Deleted

## 2014-10-24 DIAGNOSIS — W1839XA Other fall on same level, initial encounter: Secondary | ICD-10-CM | POA: Diagnosis not present

## 2014-10-24 DIAGNOSIS — R55 Syncope and collapse: Secondary | ICD-10-CM

## 2014-10-24 DIAGNOSIS — Z79899 Other long term (current) drug therapy: Secondary | ICD-10-CM | POA: Insufficient documentation

## 2014-10-24 DIAGNOSIS — Y9389 Activity, other specified: Secondary | ICD-10-CM | POA: Insufficient documentation

## 2014-10-24 DIAGNOSIS — Y92031 Bathroom in apartment as the place of occurrence of the external cause: Secondary | ICD-10-CM | POA: Insufficient documentation

## 2014-10-24 DIAGNOSIS — S79922A Unspecified injury of left thigh, initial encounter: Secondary | ICD-10-CM | POA: Diagnosis not present

## 2014-10-24 DIAGNOSIS — M79652 Pain in left thigh: Secondary | ICD-10-CM

## 2014-10-24 DIAGNOSIS — Z86010 Personal history of colonic polyps: Secondary | ICD-10-CM | POA: Diagnosis not present

## 2014-10-24 DIAGNOSIS — M25552 Pain in left hip: Secondary | ICD-10-CM

## 2014-10-24 DIAGNOSIS — S79912A Unspecified injury of left hip, initial encounter: Secondary | ICD-10-CM | POA: Insufficient documentation

## 2014-10-24 DIAGNOSIS — I4891 Unspecified atrial fibrillation: Secondary | ICD-10-CM | POA: Diagnosis not present

## 2014-10-24 DIAGNOSIS — I10 Essential (primary) hypertension: Secondary | ICD-10-CM | POA: Insufficient documentation

## 2014-10-24 DIAGNOSIS — W19XXXA Unspecified fall, initial encounter: Secondary | ICD-10-CM

## 2014-10-24 DIAGNOSIS — Y998 Other external cause status: Secondary | ICD-10-CM | POA: Diagnosis not present

## 2014-10-24 DIAGNOSIS — Z8673 Personal history of transient ischemic attack (TIA), and cerebral infarction without residual deficits: Secondary | ICD-10-CM | POA: Insufficient documentation

## 2014-10-24 HISTORY — DX: Cerebral infarction, unspecified: I63.9

## 2014-10-24 LAB — CBC WITH DIFFERENTIAL/PLATELET
Basophils Absolute: 0 10*3/uL (ref 0.0–0.1)
Basophils Relative: 0 % (ref 0–1)
Eosinophils Absolute: 0 10*3/uL (ref 0.0–0.7)
Eosinophils Relative: 0 % (ref 0–5)
HCT: 41.1 % (ref 36.0–46.0)
Hemoglobin: 13.8 g/dL (ref 12.0–15.0)
Lymphocytes Relative: 18 % (ref 12–46)
Lymphs Abs: 1.8 10*3/uL (ref 0.7–4.0)
MCH: 28.2 pg (ref 26.0–34.0)
MCHC: 33.6 g/dL (ref 30.0–36.0)
MCV: 84 fL (ref 78.0–100.0)
Monocytes Absolute: 0.9 10*3/uL (ref 0.1–1.0)
Monocytes Relative: 9 % (ref 3–12)
Neutro Abs: 7 10*3/uL (ref 1.7–7.7)
Neutrophils Relative %: 73 % (ref 43–77)
Platelets: 267 10*3/uL (ref 150–400)
RBC: 4.89 MIL/uL (ref 3.87–5.11)
RDW: 15.7 % — ABNORMAL HIGH (ref 11.5–15.5)
WBC: 9.7 10*3/uL (ref 4.0–10.5)

## 2014-10-24 LAB — COMPREHENSIVE METABOLIC PANEL
ALT: 26 U/L (ref 0–35)
AST: 34 U/L (ref 0–37)
Albumin: 4.2 g/dL (ref 3.5–5.2)
Alkaline Phosphatase: 76 U/L (ref 39–117)
Anion gap: 19 — ABNORMAL HIGH (ref 5–15)
BUN: 17 mg/dL (ref 6–23)
CO2: 20 mEq/L (ref 19–32)
Calcium: 10 mg/dL (ref 8.4–10.5)
Chloride: 100 mEq/L (ref 96–112)
Creatinine, Ser: 0.88 mg/dL (ref 0.50–1.10)
GFR calc Af Amer: 68 mL/min — ABNORMAL LOW (ref >90)
GFR calc non Af Amer: 59 mL/min — ABNORMAL LOW (ref >90)
Glucose, Bld: 102 mg/dL — ABNORMAL HIGH (ref 70–99)
Potassium: 4 mEq/L (ref 3.7–5.3)
Sodium: 139 mEq/L (ref 137–147)
Total Bilirubin: 0.5 mg/dL (ref 0.3–1.2)
Total Protein: 7.7 g/dL (ref 6.0–8.3)

## 2014-10-24 LAB — I-STAT TROPONIN, ED: Troponin i, poc: 0 ng/mL (ref 0.00–0.08)

## 2014-10-24 LAB — PROTIME-INR
INR: 1.13 (ref 0.00–1.49)
Prothrombin Time: 14.6 seconds (ref 11.6–15.2)

## 2014-10-24 LAB — URINALYSIS, ROUTINE W REFLEX MICROSCOPIC
Bilirubin Urine: NEGATIVE
Glucose, UA: NEGATIVE mg/dL
Hgb urine dipstick: NEGATIVE
Ketones, ur: NEGATIVE mg/dL
Nitrite: NEGATIVE
Protein, ur: NEGATIVE mg/dL
Specific Gravity, Urine: 1.01 (ref 1.005–1.030)
Urobilinogen, UA: 1 mg/dL (ref 0.0–1.0)
pH: 6.5 (ref 5.0–8.0)

## 2014-10-24 LAB — URINE MICROSCOPIC-ADD ON

## 2014-10-24 MED ORDER — ACETAMINOPHEN 325 MG PO TABS
650.0000 mg | ORAL_TABLET | Freq: Once | ORAL | Status: AC
  Administered 2014-10-24: 650 mg via ORAL
  Filled 2014-10-24: qty 2

## 2014-10-24 MED ORDER — SODIUM CHLORIDE 0.9 % IV BOLUS (SEPSIS)
500.0000 mL | Freq: Once | INTRAVENOUS | Status: AC
  Administered 2014-10-24: 500 mL via INTRAVENOUS

## 2014-10-24 NOTE — ED Provider Notes (Signed)
CSN: [id number]     Arrival date & time 10/24/14  2671 History   First MD Initiated Contact with Patient 10/24/14 [phone]     Chief Complaint  Patient presents with  . Fall    loc Thurs     (Consider location/radiation/quality/duration/timing/severity/associated sxs/prior Treatment) HPI  [name] is a 80 y.o. female with PMH of HTN, hyperlipidemia, CVA 2011, atrial fibrillation s/p ablation in 2011 on xarelto, hip arthritis presenting after syncope episode two days ago. After taking her night meds, pt felt dizzy, described as lightheadedness while walking to the bathroom. Pt lost conciousness and awoke in supine position. She crawled to her bed, rested and then ambulated to bathroom and then went to sleep. Patient denied headache, head injury, slurred speech, visual changes, nausea, vomiting, abdominal pain, weakness. No lightheadedness or dizziness since fall. Patient without complaint of pain until yesterday evening. She notes "soreness" to left hip that at times radiates into left foot. Denies numbness, tingling, weakness. No wound. No chest pain, SOB, back pain. No fevers, chills, recent illnesses. No neck pain. No urinary changes. Patient with cardiology appointment 11/11/2014.   Past Medical History  Diagnosis Date  . HTN (hypertension)   . History of colonic polyps   . Cervical arthritis   . Hip arthritis   . COLONIC POLYPS, HX OF 09/24/2007  . SUPRAVENTRICULAR ARRHYTHMIA 12/20/2010    s/p RFCA 05/2010-Dr. Lovena Le  . Hyperlipidemia   . Atrial fibrillation   . History of stroke   . H/O: hysterectomy     fibroid tumors  . Stroke 02/2010   Past Surgical History  Procedure Laterality Date  . Pilonidal cyst excision    . Breast lumpectomy      left  . Fb removal from popliteal fossae    . Intracapsular cataract extraction       Left eye Oct 18, 2009  right eye Jan '11 By Dr Era Skeen  . Colon surgery  03/19/2009    colon cancer   . Transthoracic echocardiogram  2007  .  Abdominal hysterectomy     Family History  Problem Relation Age of Onset  . Heart attack Mother   . Stroke Mother   . Coronary artery disease Mother   . Heart disease Mother   . Cancer Father     Colon Cancer  . Diabetes Daughter   . Cancer Daughter     Leukemia   History  Substance Use Topics  . Smoking status: Never Smoker   . Smokeless tobacco: Never Used  . Alcohol Use: Yes     Comment: rare   OB History    No data available     Review of Systems  Constitutional: Negative for fever and chills.  HENT: Negative for congestion and rhinorrhea.   Eyes: Negative for visual disturbance.  Respiratory: Negative for cough and shortness of breath.   Cardiovascular: Negative for chest pain and palpitations.  Gastrointestinal: Negative for nausea, vomiting and diarrhea.  Genitourinary: Negative for dysuria and hematuria.  Musculoskeletal: Negative for back pain and gait problem.  Skin: Negative for rash.  Neurological: Negative for weakness and headaches.      Allergies  Codeine  Home Medications   Prior to Admission medications   Medication Sig Start Date End Date Taking? Authorizing Provider  acetaminophen (TYLENOL) 325 MG tablet Take 650 mg by mouth every 6 (six) hours as needed for headache (pain).    Yes Historical Provider, MD  amiodarone (PACERONE) 200 MG tablet 1/2 tablet  bid Mon-Fri and 1/2 tablet daily on Sat and Sun Patient taking differently: Take 100 mg by mouth See admin instructions. 1/2 tablet bid Mon-Fri and 1/2 tablet daily on Sat and Sun 02/18/14  Yes Evans Lance, MD  amLODipine (NORVASC) 5 MG tablet TAKE 1 TABLET DAILY 08/20/14  Yes Evans Lance, MD  folic acid-pyridoxine-cyancobalamin (FOLBIC) 2.5-25-2 MG TABS TAKE 1 TABLET DAILY 08/25/14  Yes Biagio Borg, MD  losartan (COZAAR) 100 MG tablet Take 1 tablet (100 mg total) by mouth daily. 02/18/14  Yes Evans Lance, MD  nitroGLYCERIN (NITROSTAT) 0.4 MG SL tablet Place 0.4 mg under the tongue every 5  (five) minutes as needed for chest pain.    Yes Historical Provider, MD  polyethylene glycol (MIRALAX / GLYCOLAX) packet Take 17 g by mouth as needed (constipation).    Yes Historical Provider, MD  XARELTO 15 MG TABS tablet TAKE 1 TABLET DAILY 08/26/14  Yes Evans Lance, MD   BP 147/52 mmHg  Pulse 62  Temp(Src) 98.1 F (36.7 C) (Oral)  Resp 16  Ht 5\' 2"  (1.575 m)  Wt 128 lb (58.06 kg)  BMI 23.41 kg/m2  SpO2 96% Physical Exam  Constitutional: She appears well-developed and well-nourished. No distress.  HENT:  Head: Normocephalic and atraumatic.  Mouth/Throat: Oropharynx is clear and moist.  Eyes: Conjunctivae and EOM are normal. Pupils are equal, round, and reactive to light. Right eye exhibits no discharge. Left eye exhibits no discharge.  Neck: Normal range of motion. Neck supple.  No nuchal rigidity  Cardiovascular: Normal rate and regular rhythm.   Pulmonary/Chest: Effort normal and breath sounds normal. No respiratory distress. She has no wheezes.  Abdominal: Soft. Bowel sounds are normal. She exhibits no distension. There is no tenderness.  Musculoskeletal:  No left hip tenderness to palpation. No ecchymoses or wound, erythema or swelling. Pain with adduction, internal and external rotation. ttp of left proximal thigh worse with strength exercises. Equal pedal pulses bilaterally. 5/5 strength in bilateral lower extremities. Neurovascularly intact.  Neurological: She is alert. No cranial nerve deficit. Coordination normal.  Speech is clear and goal oriented. Peripheral visual fields intact. Strength 5/5 in upper and lower extremities. Sensation intact. Intact finger to nose. No pronator drift. Normal gait with cane which she does not use unless she is at rest or pivoting.    Skin: Skin is warm and dry. She is not diaphoretic.  Nursing note and vitals reviewed.   ED Course  Procedures (including critical care time) Labs Review Labs Reviewed  CBC WITH DIFFERENTIAL - Abnormal;  Notable for the following:    RDW 15.7 (*)    All other components within normal limits  COMPREHENSIVE METABOLIC PANEL - Abnormal; Notable for the following:    Glucose, Bld 102 (*)    GFR calc non Af Amer 59 (*)    GFR calc Af Amer 68 (*)    Anion gap 19 (*)    All other components within normal limits  URINALYSIS, ROUTINE W REFLEX MICROSCOPIC - Abnormal; Notable for the following:    APPearance CLOUDY (*)    Leukocytes, UA SMALL (*)    All other components within normal limits  URINE MICROSCOPIC-ADD ON - Abnormal; Notable for the following:    Squamous Epithelial / LPF FEW (*)    All other components within normal limits  PROTIME-INR  I-STAT TROPOININ, ED    Imaging Review Dg Pelvis 1-2 Views  10/24/2014   CLINICAL DATA:  Fall on Thursday, injury  left hip and femur. Lateral pain. Initial encounter.  EXAM: PELVIS - 1-2 VIEW  COMPARISON:  None.  FINDINGS: There is no evidence of pelvic fracture or diastasis. No pelvic bone lesions are seen.  IMPRESSION: Negative.   Electronically Signed   By: Rolm Baptise M.D.   On: 10/24/2014 12:39   Dg Femur Left  10/24/2014   CLINICAL DATA:  Fall at home Thursday.  A lateral left hip pain.  EXAM: LEFT FEMUR - 2 VIEW  COMPARISON:  Pelvis performed today.  FINDINGS: No acute bony abnormality. Specifically, no fracture, subluxation, or dislocation. Soft tissues are intact.  IMPRESSION: No acute bony abnormality.   Electronically Signed   By: Rolm Baptise M.D.   On: 10/24/2014 12:40   Ct Head Wo Contrast  10/24/2014   CLINICAL DATA:  Dizziness, syncope and headache. History of hypertension, atrial fibrillation and prior cerebral infarction.  EXAM: CT HEAD WITHOUT CONTRAST  TECHNIQUE: Contiguous axial images were obtained from the base of the skull through the vertex without intravenous contrast.  COMPARISON:  MRI of the brain dated 11/11/2007  FINDINGS: Old-appearing left frontal infarct identified extending into the temporal lobe. There is associated  dilatation of the adjacent lateral ventricle. The brain demonstrates no evidence of acute hemorrhage, acute infarction, edema, mass effect, extra-axial fluid collection, hydrocephalus or mass lesion. The skull is unremarkable.  IMPRESSION: Old left frontal and temporal lobe infarcts.  No acute findings.   Electronically Signed   By: Aletta Edouard M.D.   On: 10/24/2014 11:15     EKG Interpretation   Date/Time:  Saturday October 24 2014 09:26:53 EST Ventricular Rate:  79 PR Interval:  201 QRS Duration: 99 QT Interval:  412 QTC Calculation: 472 R Axis:   -56 Text Interpretation:  Sinus rhythm Left anterior fascicular block Probable  left ventricular hypertrophy Unchanged Confirmed by Kathrynn Humble, MD, Thelma Comp  [phone]) on 10/24/2014 10:03:24 AM      Meds given in ED:  Medications  acetaminophen (TYLENOL) tablet 650 mg (650 mg Oral Given 10/24/14 1056)  sodium chloride 0.9 % bolus 500 mL (0 mLs Intravenous Stopped 10/24/14 1124)    New Prescriptions   No medications on file     MDM   Final diagnoses:  Syncope  Fall, initial encounter  Hip pain, left  Pain of left thigh   Pt with history of syncope with lightheadedness prodrome with standing 2 days ago without subsequent episodes. Pt denies HA, blurred vision, slurred speech, nausea, vomiting, weakness. Pt on xarelto for atrial fibrilation s/p ablation. Pt complaint of left hip and thigh pain. VSS. Pt with mild orthostasis. Neurological exam completely normal. TTP of left hip. Labs reassuring. EKG without acute findings and troponin negative. Xray without dislocation or fracture. Head CT without acute findings. Pt ambulated without difficulty in ED. RICE protocol and tylenol. Pt with cardiology follow up 11/11/14. Pt to follow up with PCP in one week. Patient is afebrile, nontoxic, and in no acute distress. Patient is appropriate for outpatient management and is stable for discharge.  Discussed return precautions with patient. Discussed  all results and patient verbalizes understanding and agrees with plan.  This is a shared patient. This patient was discussed with the physician,Dr. Kathrynn Humble who saw and evaluated the patient and agrees with the plan.     Pura Spice, PA-C 10/24/14 [place], MD 10/25/14 [phone]

## 2014-10-24 NOTE — Discharge Instructions (Signed)
Return to the emergency room with worsening of symptoms, new symptoms or with symptoms that are concerning, especially return of lightheadedness/ dizziness that is persistent, severe worsening of headache, visual or speech changes, weakness in face, arms or legs. Follow up with your cardiologist as scheduled. Walk with your cane and rely on your cane more than normal.  RICE: Rest, Ice (three cycles of 20 mins on, 66mins off at least twice a day), compression/brace, elevation. Heating pad works well for back pain. Tx pain with tylenol.  Please call your doctor for a followup appointment within 24-48 hours. When you talk to your doctor please let them know that you were seen in the emergency department and have them acquire all of your records so that they can discuss the findings with you and formulate a treatment plan to fully care for your new and ongoing problems.

## 2014-10-24 NOTE — ED Notes (Signed)
Patient to xray.

## 2014-10-24 NOTE — ED Notes (Signed)
Pt states that Thurs night, immediately after she took her evening meds, she began feeling dizzy when she walked into her bathroom.  Pt doesn't know how long she was out, but awoke in supine position.  She crawled to her bed, sat for a bit and then felt well enough to walk.  Pt did not feeling pain until Friday evening.  C/o "soreness" to L hip that radiates to L foot.

## 2014-10-24 NOTE — ED Notes (Signed)
Called x-ray and they stated there are 2 patients in from of [facility].

## 2014-11-01 ENCOUNTER — Encounter (HOSPITAL_COMMUNITY): Payer: Self-pay | Admitting: *Deleted

## 2014-11-01 ENCOUNTER — Emergency Department (HOSPITAL_COMMUNITY)
Admission: EM | Admit: 2014-11-01 | Discharge: 2014-11-01 | Disposition: A | Discharge status: Home / Self Care | Payer: Medicare Other | Attending: Emergency Medicine | Admitting: Emergency Medicine

## 2014-11-01 ENCOUNTER — Emergency Department (HOSPITAL_COMMUNITY): Payer: Medicare Other

## 2014-11-01 DIAGNOSIS — M1990 Unspecified osteoarthritis, unspecified site: Secondary | ICD-10-CM | POA: Insufficient documentation

## 2014-11-01 DIAGNOSIS — W19XXXA Unspecified fall, initial encounter: Secondary | ICD-10-CM

## 2014-11-01 DIAGNOSIS — S4991XA Unspecified injury of right shoulder and upper arm, initial encounter: Secondary | ICD-10-CM | POA: Insufficient documentation

## 2014-11-01 DIAGNOSIS — E785 Hyperlipidemia, unspecified: Secondary | ICD-10-CM | POA: Diagnosis not present

## 2014-11-01 DIAGNOSIS — W1839XA Other fall on same level, initial encounter: Secondary | ICD-10-CM | POA: Insufficient documentation

## 2014-11-01 DIAGNOSIS — Z8673 Personal history of transient ischemic attack (TIA), and cerebral infarction without residual deficits: Secondary | ICD-10-CM | POA: Insufficient documentation

## 2014-11-01 DIAGNOSIS — I10 Essential (primary) hypertension: Secondary | ICD-10-CM | POA: Insufficient documentation

## 2014-11-01 DIAGNOSIS — Y998 Other external cause status: Secondary | ICD-10-CM | POA: Insufficient documentation

## 2014-11-01 DIAGNOSIS — Z86010 Personal history of colonic polyps: Secondary | ICD-10-CM | POA: Diagnosis not present

## 2014-11-01 DIAGNOSIS — Y9389 Activity, other specified: Secondary | ICD-10-CM | POA: Diagnosis not present

## 2014-11-01 DIAGNOSIS — Y929 Unspecified place or not applicable: Secondary | ICD-10-CM | POA: Insufficient documentation

## 2014-11-01 DIAGNOSIS — M25511 Pain in right shoulder: Secondary | ICD-10-CM

## 2014-11-01 DIAGNOSIS — Z9089 Acquired absence of other organs: Secondary | ICD-10-CM | POA: Diagnosis not present

## 2014-11-01 DIAGNOSIS — Z79899 Other long term (current) drug therapy: Secondary | ICD-10-CM | POA: Insufficient documentation

## 2014-11-01 MED ORDER — HYDROCODONE-ACETAMINOPHEN 5-325 MG PO TABS: 0.5000 | ORAL_TABLET | Freq: Four times a day (QID) | ORAL | Status: DC | PRN

## 2014-11-01 NOTE — Discharge Instructions (Signed)
Recommend alternating ice and heat packs to your shoulder. Take Norco as prescribed for severe pain. Do not take Tylenol or acetaminophen with this medication. Recommend that you perform frequent stretching of your shoulder to prevent frozen shoulder. Follow-up with orthopedics as needed if symptoms persist or worsen.  Shoulder Sprain A shoulder sprain is the result of damage to the tough, fiber-like tissues (ligaments) that help hold your shoulder in place. The ligaments may be stretched or torn. Besides the main shoulder joint (the ball and socket), there are several smaller joints that connect the bones in this area. A sprain usually involves one of those joints. Most often it is the acromioclavicular (or AC) joint. That is the joint that connects the collarbone (clavicle) and the shoulder blade (scapula) at the top point of the shoulder blade (acromion). A shoulder sprain is a mild form of what is called a shoulder separation. Recovering from a shoulder sprain may take some time. For some, pain lingers for several months. Most people recover without long term problems. CAUSES   A shoulder sprain is usually caused by some kind of trauma. This might be:  Falling on an outstretched arm.  Being hit hard on the shoulder.  Twisting the arm.  Shoulder sprains are more likely to occur in people who:  Play sports.  Have balance or coordination problems. SYMPTOMS   Pain when you move your shoulder.  Limited ability to move the shoulder.  Swelling and tenderness on top of the shoulder.  Redness or warmth in the shoulder.  Bruising.  A change in the shape of the shoulder. DIAGNOSIS  Your healthcare provider may:  Ask about your symptoms.  Ask about recent activity that might have caused those symptoms.  Examine your shoulder. You may be asked to do simple exercises to test movement. The other shoulder will be examined for comparison.  Order some tests that provide a look inside the  body. They can show the extent of the injury. The tests could include:  X-rays.  CT (computed tomography) scan.  MRI (magnetic resonance imaging) scan. RISKS AND COMPLICATIONS  Loss of full shoulder motion.  Ongoing shoulder pain. TREATMENT  How long it takes to recover from a shoulder sprain depends on how severe it was. Treatment options may include:  Rest. You should not use the arm or shoulder until it heals.  Ice. For 2 or 3 days after the injury, put an ice pack on the shoulder up to 4 times a day. It should stay on for 15 to 20 minutes each time. Wrap the ice in a towel so it does not touch your skin.  Over-the-counter medicine to relieve pain.  A sling or brace. This will keep the arm still while the shoulder is healing.  Physical therapy or rehabilitation exercises. These will help you regain strength and motion. Ask your healthcare provider when it is OK to begin these exercises.  Surgery. The need for surgery is rare with a sprained shoulder, but some people may need surgery to keep the joint in place and reduce pain. HOME CARE INSTRUCTIONS   Ask your healthcare provider about what you should and should not do while your shoulder heals.  Make sure you know how to apply ice to the correct area of your shoulder.  Talk with your healthcare provider about which medications should be used for pain and swelling.  If rehabilitation therapy will be needed, ask your healthcare provider to refer you to a therapist. If it is not  recommended, then ask about at-home exercises. Find out when exercise should begin. SEEK MEDICAL CARE IF:  Your pain, swelling, or redness at the joint increases. SEEK IMMEDIATE MEDICAL CARE IF:   You have a fever.  You cannot move your arm or shoulder. Document Released: 04/08/2009 Document Revised: 02/12/2012 Document Reviewed: 04/08/2009 [facility] Patient Information 2015 [place], [place]. This information is not intended to replace advice given to  you by your health care provider. Make sure you discuss any questions you have with your health care provider.

## 2014-11-01 NOTE — ED Notes (Signed)
Pt states that she fell on Thurs and was evaluated; pt denied having rt shoulder pain after fall but states that shoulder began hurting this afternoon after church; pt c/o states that pain has progressively gotten worse and it hurts to rotate shoulder

## 2014-11-01 NOTE — ED Provider Notes (Signed)
CSN: [id number]     Arrival date & time 11/01/14  2023 History   First MD Initiated Contact with Patient 11/01/14 2104     This chart was scribed for non-physician practitioner, Antonietta Breach, PA-C working with Debby Freiberg, MD by Forrestine Him, ED Scribe. This patient was seen in room WTR6/WTR6 and the patient's care was started at 10:13 PM.   Chief Complaint  Patient presents with  . Shoulder Pain   The history is provided by the patient. No language interpreter was used.   HPI Comments: [name] is a 80 y.o. female with a PMHx of HTN, arthritis, hyperlipidemia, and A-Fib who presents to the Emergency Department complaining of constant, non-radiating R shoulder pain onset this afternoon while sitting in church. She describes pain as throbbing. Pt sustained a fall 10 days ago after having 1 glass of wine. She has not tried OTC Tylenol without any improvement for symptoms. Pt is currently on Xarelto daily. Pt with known allergy to Codeine.   Past Medical History  Diagnosis Date  . HTN (hypertension)   . History of colonic polyps   . Cervical arthritis   . Hip arthritis   . COLONIC POLYPS, HX OF 09/24/2007  . SUPRAVENTRICULAR ARRHYTHMIA 12/20/2010    s/p RFCA 05/2010-Dr. Lovena Le  . Hyperlipidemia   . Atrial fibrillation   . History of stroke   . H/O: hysterectomy     fibroid tumors  . Stroke 02/2010   Past Surgical History  Procedure Laterality Date  . Pilonidal cyst excision    . Breast lumpectomy      left  . Fb removal from popliteal fossae    . Intracapsular cataract extraction       Left eye Oct 18, 2009  right eye Jan '11 By Dr Era Skeen  . Colon surgery  03/19/2009    colon cancer   . Transthoracic echocardiogram  2007  . Abdominal hysterectomy     Family History  Problem Relation Age of Onset  . Heart attack Mother   . Stroke Mother   . Coronary artery disease Mother   . Heart disease Mother   . Cancer Father     Colon Cancer  . Diabetes Daughter   .  Cancer Daughter     Leukemia   History  Substance Use Topics  . Smoking status: Never Smoker   . Smokeless tobacco: Never Used  . Alcohol Use: Yes     Comment: rare   OB History    No data available      Review of Systems  Constitutional: Negative for fever and chills.  Musculoskeletal: Positive for arthralgias.  Neurological: Negative for weakness and numbness.  All other systems reviewed and are negative.   Allergies  Codeine  Home Medications   Prior to Admission medications   Medication Sig Start Date End Date Taking? Authorizing Provider  acetaminophen (TYLENOL) 325 MG tablet Take 650 mg by mouth every 6 (six) hours as needed for headache (pain).    Yes Historical Provider, MD  amiodarone (PACERONE) 200 MG tablet 1/2 tablet bid Mon-Fri and 1/2 tablet daily on Sat and Sun Patient taking differently: Take 100 mg by mouth See admin instructions. 1/2 tablet twice a day  Mon-Fri and 1/2 tablet daily on Sat and Sun 02/18/14  Yes Evans Lance, MD  amLODipine (NORVASC) 5 MG tablet TAKE 1 TABLET DAILY 08/20/14  Yes Evans Lance, MD  folic acid-pyridoxine-cyancobalamin (FOLBIC) 2.5-25-2 MG TABS TAKE 1 TABLET DAILY  08/25/14  Yes Biagio Borg, MD  losartan (COZAAR) 100 MG tablet Take 1 tablet (100 mg total) by mouth daily. 02/18/14  Yes Evans Lance, MD  XARELTO 15 MG TABS tablet TAKE 1 TABLET DAILY 08/26/14  Yes Evans Lance, MD  HYDROcodone-acetaminophen (NORCO/VICODIN) 5-325 MG per tablet Take 0.5 tablets by mouth every 6 (six) hours as needed for moderate pain or severe pain. 11/01/14   Antonietta Breach, PA-C  nitroGLYCERIN (NITROSTAT) 0.4 MG SL tablet Place 0.4 mg under the tongue every 5 (five) minutes as needed for chest pain.     Historical Provider, MD  polyethylene glycol (MIRALAX / GLYCOLAX) packet Take 17 g by mouth as needed (constipation).     Historical Provider, MD   Triage Vitals: BP 177/55 mmHg  Pulse 68  Temp(Src) 98.3 F (36.8 C) (Oral)  Resp 20  SpO2 98%     Physical Exam  Constitutional: She is oriented to person, place, and time. She appears well-developed and well-nourished. No distress.  Nontoxic/nonseptic appearing  HENT:  Head: Normocephalic and atraumatic.  Eyes: Conjunctivae and EOM are normal. No scleral icterus.  Neck: Normal range of motion.  Cardiovascular: Normal rate, regular rhythm and intact distal pulses.   Distal radial pulse 2+ in RUE  Pulmonary/Chest: Effort normal. No respiratory distress.  Respirations even and unlabored  Abdominal: She exhibits no distension.  Musculoskeletal: She exhibits tenderness.       Right shoulder: She exhibits decreased range of motion (Near full PROM with AROM with abduction to 90 degrees), tenderness and pain. She exhibits no bony tenderness, no swelling, no effusion, no crepitus, no deformity, no spasm, normal pulse and normal strength.       Cervical back: Normal. She exhibits no bony tenderness (No TTP to cervical midline).       Arms: Neurological: She is alert and oriented to person, place, and time. She exhibits normal muscle tone. Coordination normal.  Sensation to light touch intact. Grip strength 5/5 in RUE. Normal strength against resistance in RUE.  Skin: Skin is warm and dry. No rash noted. She is not diaphoretic. No erythema. No pallor.  Psychiatric: She has a normal mood and affect. Her behavior is normal.  Nursing note and vitals reviewed.   ED Course  Procedures (including critical care time)  DIAGNOSTIC STUDIES: Oxygen Saturation is 98% on RA, Normal by my interpretation.    COORDINATION OF CARE: 10:13 PM- Will order DG Shoulder R. Discussed treatment plan with pt at bedside and pt agreed to plan.     Labs Review Labs Reviewed - No data to display  Imaging Review Dg Shoulder Right  11/01/2014   CLINICAL DATA:  80 year old female with fall, right shoulder injury and pain.  EXAM: RIGHT SHOULDER - 2+ VIEW  COMPARISON:  None.  FINDINGS: There is no evidence of  fracture or dislocation. There is no evidence of arthropathy or other focal bone abnormality. Soft tissues are unremarkable.  IMPRESSION: Negative.   Electronically Signed   By: Hassan Rowan M.D.   On: 11/01/2014 21:09     EKG Interpretation None      MDM   Final diagnoses:  Shoulder pain, right    80 year old female presents to the emergency department for further evaluation of right shoulder pain. No known injury or trauma, though patient does state that she sleeps on her right side. She is neurovascularly intact on physical exam. No crepitus, deformity, or effusion. No evidence of septic joint. Imaging negative for fracture or  dislocation. Symptoms likely secondary to muscular injury. We'll manage as outpatient with Norco. Unable to provide NSAIDs as patient is on Xarelto. Stretching advised as well. Orthopedic referral provided and return precautions discussed. Patient able to plan with no unaddressed concerns.  I personally performed the services described in this documentation, which was scribed in my presence. The recorded information has been reviewed and is accurate.    Filed Vitals:   11/01/14 2036  BP: 177/55  Pulse: 68  Temp: 98.3 F (36.8 C)  TempSrc: Oral  Resp: 20  SpO2: 98%     Antonietta Breach, PA-C 11/01/14 2216  Debby Freiberg, MD 11/07/14 [phone]

## 2014-11-05 ENCOUNTER — Ambulatory Visit: Payer: Medicare Other | Admitting: Family

## 2014-11-11 ENCOUNTER — Ambulatory Visit (INDEPENDENT_AMBULATORY_CARE_PROVIDER_SITE_OTHER): Payer: Medicare Other | Admitting: Internal Medicine

## 2014-11-11 ENCOUNTER — Encounter: Payer: Self-pay | Admitting: Internal Medicine

## 2014-11-11 VITALS — BP 164/68 | HR 66 | Ht 62.0 in | Wt 122.8 lb

## 2014-11-11 DIAGNOSIS — I480 Paroxysmal atrial fibrillation: Secondary | ICD-10-CM

## 2014-11-11 DIAGNOSIS — R002 Palpitations: Secondary | ICD-10-CM

## 2014-11-11 DIAGNOSIS — I4891 Unspecified atrial fibrillation: Secondary | ICD-10-CM

## 2014-11-11 DIAGNOSIS — I10 Essential (primary) hypertension: Secondary | ICD-10-CM

## 2014-11-11 MED ORDER — AMIODARONE HCL 200 MG PO TABS: ORAL_TABLET | ORAL | Status: DC

## 2014-11-11 NOTE — Assessment & Plan Note (Signed)
She has had no palpitations of amiodarone. No change in meds except as noted.

## 2014-11-11 NOTE — Progress Notes (Signed)
HPI [name] returns today for followup. She is a very pleasant 80 year old woman with a history of SVT status post ablation, atrial fibrillation status post stroke now on systemic anticoagulation with Xarelto, hypertension, and dyslipidemia. In the interim, she has done well. She has minimal unsteadiness with her right leg and uses a cane on a level ground.  She has had no palpitations or symptomatic atrial fibrillation. She is maintained on chronic anticoagulation. She had a single episode of syncope. She does not think she passed out but did fall to the ground. No injury. She was drinking wine when it happened.  Allergies  Allergen Reactions  . Codeine     REACTION: fine rash     Current Outpatient Prescriptions  Medication Sig Dispense Refill  . acetaminophen (TYLENOL) 325 MG tablet Take 650 mg by mouth every 6 (six) hours as needed for headache (pain).     Marland Kitchen amiodarone (PACERONE) 200 MG tablet 1/2 tablet bid Mon-Fri and 1/2 tablet daily on Sat and Sun (Patient taking differently: Take 100 mg by mouth See admin instructions. 1/2 tablet twice a day  Mon-Fri and 1/2 tablet daily on Sat and Sun) 90 tablet 3  . amLODipine (NORVASC) 5 MG tablet TAKE 1 TABLET DAILY (Patient taking differently: TAKE 1 TABLET BY MOUTH DAILY) 90 tablet 0  . folic acid-pyridoxine-cyancobalamin (FOLBIC) 2.5-25-2 MG TABS TAKE 1 TABLET DAILY (Patient taking differently: TAKE 1 TABLET BY MOUTH DAILY) 90 tablet 2  . losartan (COZAAR) 100 MG tablet Take 1 tablet (100 mg total) by mouth daily. 90 tablet 3  . nitroGLYCERIN (NITROSTAT) 0.4 MG SL tablet Place 0.4 mg under the tongue every 5 (five) minutes as needed for chest pain (MAX 3 TABLETS).     . polyethylene glycol (MIRALAX / GLYCOLAX) packet Take 17 g by mouth daily as needed (constipation).     Alveda Reasons 15 MG TABS tablet TAKE 1 TABLET DAILY (Patient taking differently: TAKE 1 TABLET BY MOUTH DAILY) 90 tablet 0   No current facility-administered medications  for this visit.     Past Medical History  Diagnosis Date  . HTN (hypertension)   . History of colonic polyps   . Cervical arthritis   . Hip arthritis   . COLONIC POLYPS, HX OF 09/24/2007  . SUPRAVENTRICULAR ARRHYTHMIA 12/20/2010    s/p RFCA 05/2010-Dr. Lovena Le  . Hyperlipidemia   . Atrial fibrillation   . History of stroke   . H/O: hysterectomy     fibroid tumors  . Stroke 02/2010    ROS:   All systems reviewed and negative except as noted in the HPI.   Past Surgical History  Procedure Laterality Date  . Pilonidal cyst excision    . Breast lumpectomy      left  . Fb removal from popliteal fossae    . Intracapsular cataract extraction       Left eye Oct 18, 2009  right eye Jan '11 By Dr Era Skeen  . Colon surgery  03/19/2009    colon cancer   . Transthoracic echocardiogram  2007  . Abdominal hysterectomy       Family History  Problem Relation Age of Onset  . Heart attack Mother   . Stroke Mother   . Coronary artery disease Mother   . Heart disease Mother   . Cancer Father     Colon Cancer  . Diabetes Daughter   . Cancer Daughter     Leukemia  History   Social History  . Marital Status: Widowed    Spouse Name: N/A    Number of Children: 6  . Years of Education: N/A   Occupational History  . Retired    Social History Main Topics  . Smoking status: Never Smoker   . Smokeless tobacco: Never Used  . Alcohol Use: Yes     Comment: rare  . Drug Use: No  . Sexual Activity: Not Currently   Other Topics Concern  . Not on file   Social History Narrative   married - 1951- widowed Apr 12, 2023. 4-daughters, 1 died leukemia; 2 sons; 10 grandchildren; 5 great-grandchildren. I-ADLs. Retired. Lives in her own home but one of her children is with her all of the time. ACP- No CPR, no prolonged mechanical ventilation; not to be sustained long term with artificial feeding or hydration; no chronic HD; no other heroic or futile.            BP 164/68 mmHg  Pulse 66   Ht 5\' 2"  (1.575 m)  Wt 122 lb 12.8 oz (55.702 kg)  BMI 22.45 kg/m2  Physical Exam:  Well appearing elderly woman,NAD HEENT: Unremarkable Neck:  No JVD, no thyromegally Back:  No CVA tenderness Lungs:  Clear with no wheezes HEART:  Regular rate rhythm, no murmurs, no rubs, no clicks Abd:  soft, positive bowel sounds, no organomegally, no rebound, no guarding Ext:  2 plus pulses, no edema, no cyanosis, no clubbing Skin:  No rashes no nodules Neuro:  CN II through XII intact, motor grossly intact    Assess/Plan:

## 2014-11-11 NOTE — Assessment & Plan Note (Signed)
Her blood pressure is elevated today. She has been better at home. She will continue her amlodipine and losartan. Will recheck in several months.

## 2014-11-11 NOTE — Patient Instructions (Signed)
Your physician wants you to follow-up in:12 months with Dr. Knox Saliva will receive a reminder letter in the mail two months in advance. If you don't receive a letter, please call our office to schedule the follow-up appointment.  Your physician has recommended you make the following change in your medication:  1) Reduce Amiodarone to 200mg  Mon-Thurs and 100mg  Fri-Sun

## 2014-11-11 NOTE — Assessment & Plan Note (Signed)
She appears to be maintaining NSR on low dose amiodarone. She is instructed to reduce her dose to 200 mg daily Mon.-Thursday and 100 mg Fri/Sat/Sun. I will see her back in a year. She will continue her systemic anticoagulation.

## 2014-11-20 ENCOUNTER — Other Ambulatory Visit: Payer: Self-pay | Admitting: Internal Medicine

## 2014-12-01 ENCOUNTER — Other Ambulatory Visit: Payer: Self-pay | Admitting: Internal Medicine

## 2014-12-01 MED ORDER — RIVAROXABAN 15 MG PO TABS: 15.0000 mg | ORAL_TABLET | Freq: Every day | ORAL | Status: DC

## 2014-12-02 ENCOUNTER — Ambulatory Visit: Payer: Medicare Other | Admitting: Internal Medicine

## 2014-12-02 ENCOUNTER — Other Ambulatory Visit: Payer: Self-pay | Admitting: *Deleted

## 2014-12-02 MED ORDER — RIVAROXABAN 15 MG PO TABS: 15.0000 mg | ORAL_TABLET | Freq: Every day | ORAL | Status: DC

## 2014-12-21 ENCOUNTER — Ambulatory Visit (INDEPENDENT_AMBULATORY_CARE_PROVIDER_SITE_OTHER): Payer: Medicare Other | Admitting: Internal Medicine

## 2014-12-21 ENCOUNTER — Encounter: Payer: Self-pay | Admitting: Internal Medicine

## 2014-12-21 VITALS — BP 148/88 | HR 72 | Temp 97.8°F | Resp 16 | Wt 123.8 lb

## 2014-12-21 DIAGNOSIS — I480 Paroxysmal atrial fibrillation: Secondary | ICD-10-CM

## 2014-12-21 DIAGNOSIS — Z0000 Encounter for general adult medical examination without abnormal findings: Secondary | ICD-10-CM | POA: Diagnosis not present

## 2014-12-21 DIAGNOSIS — I639 Cerebral infarction, unspecified: Secondary | ICD-10-CM | POA: Diagnosis not present

## 2014-12-21 DIAGNOSIS — I10 Essential (primary) hypertension: Secondary | ICD-10-CM | POA: Diagnosis not present

## 2014-12-21 NOTE — Assessment & Plan Note (Signed)
Aged out of colonoscopy and mammogram. Up to date on shingles, prevnar, pneumonia 23, tetanus due in 2020. Flu shot already this season. Talked with her about advanced directives and diet and she is active.

## 2014-12-21 NOTE — Patient Instructions (Signed)
We will see you back in about 1 year. You are doing great, keep up the good work and stay active.  If you have any problems or questions please feel free to call the office or come back sooner.  We are not going to check any blood work today.  Fall Prevention and Home Safety Falls cause injuries and can affect all age groups. It is possible to use preventive measures to significantly decrease the likelihood of falls. There are many simple measures which can make your home safer and prevent falls. OUTDOORS  Repair cracks and edges of walkways and driveways.  Remove high doorway thresholds.  Trim shrubbery on the main path into your home.  Have good outside lighting.  Clear walkways of tools, rocks, debris, and clutter.  Check that handrails are not broken and are securely fastened. Both sides of steps should have handrails.  Have leaves, snow, and ice cleared regularly.  Use sand or salt on walkways during winter months.  In the garage, clean up grease or oil spills. BATHROOM  Install night lights.  Install grab bars by the toilet and in the tub and shower.  Use non-skid mats or decals in the tub or shower.  Place a plastic non-slip stool in the shower to sit on, if needed.  Keep floors dry and clean up all water on the floor immediately.  Remove soap buildup in the tub or shower on a regular basis.  Secure bath mats with non-slip, double-sided rug tape.  Remove throw rugs and tripping hazards from the floors. BEDROOMS  Install night lights.  Make sure a bedside light is easy to reach.  Do not use oversized bedding.  Keep a telephone by your bedside.  Have a firm chair with side arms to use for getting dressed.  Remove throw rugs and tripping hazards from the floor. KITCHEN  Keep handles on pots and pans turned toward the center of the stove. Use back burners when possible.  Clean up spills quickly and allow time for drying.  Avoid walking on wet  floors.  Avoid hot utensils and knives.  Position shelves so they are not too high or low.  Place commonly used objects within easy reach.  If necessary, use a sturdy step stool with a grab bar when reaching.  Keep electrical cables out of the way.  Do not use floor polish or wax that makes floors slippery. If you must use wax, use non-skid floor wax.  Remove throw rugs and tripping hazards from the floor. STAIRWAYS  Never leave objects on stairs.  Place handrails on both sides of stairways and use them. Fix any loose handrails. Make sure handrails on both sides of the stairways are as long as the stairs.  Check carpeting to make sure it is firmly attached along stairs. Make repairs to worn or loose carpet promptly.  Avoid placing throw rugs at the top or bottom of stairways, or properly secure the rug with carpet tape to prevent slippage. Get rid of throw rugs, if possible.  Have an electrician put in a light switch at the top and bottom of the stairs. OTHER FALL PREVENTION TIPS  Wear low-heel or rubber-soled shoes that are supportive and fit well. Wear closed toe shoes.  When using a stepladder, make sure it is fully opened and both spreaders are firmly locked. Do not climb a closed stepladder.  Add color or contrast paint or tape to grab bars and handrails in your home. Place contrasting color strips  on first and last steps.  Learn and use mobility aids as needed. Install an electrical emergency response system.  Turn on lights to avoid dark areas. Replace light bulbs that burn out immediately. Get light switches that glow.  Arrange furniture to create clear pathways. Keep furniture in the same place.  Firmly attach carpet with non-skid or double-sided tape.  Eliminate uneven floor surfaces.  Select a carpet pattern that does not visually hide the edge of steps.  Be aware of all pets. OTHER HOME SAFETY TIPS  Set the water temperature for 120 F (48.8 C).  Keep  emergency numbers on or near the telephone.  Keep smoke detectors on every level of the home and near sleeping areas. Document Released: 11/10/2002 Document Revised: 05/21/2012 Document Reviewed: 02/09/2012 [facility] Patient Information 2015 [place], [place]. This information is not intended to replace advice given to you by your health care provider. Make sure you discuss any questions you have with your health care provider.

## 2014-12-21 NOTE — Progress Notes (Signed)
   Subjective:    Patient ID: [name], female    DOB: Nov 08, 1928, 81 y.o.   MRN: [id number]  HPI The patient is an 81 YO female who is coming in to establish care and wellness. She has PMH of atrial fibrillation, HTN, hx stroke (related to a fib, some weakness left in her leg). She is doing well overall and her daughter and son are with her today and help to provide history. She fell in November but did not injure herself. She denies falls since then and faithfully uses her cane. She denies chest pains, palpitations, abdominal pain. She denies joint pains or balance problems. She does live with her son. She is widowed for some years.   Here for medicare wellness  Diet: heart healthy Physical activity: active but no formal exercise Depression/mood screen: negative Hearing: intact to whispered voice Visual acuity: grossly normal, performs annual eye exam  ADLs: capable Fall risk: none Home safety: good Cognitive evaluation: intact to orientation, naming, recall and repetition EOL planning: adv directives discussed and they will discuss  I have personally reviewed and have noted 1. The patient's medical and social history 2. Their use of alcohol, tobacco or illicit drugs 3. Their current medications and supplements 4. The patient's functional ability including ADL's, fall risks, home safety risks and hearing or visual impairment. 5. Diet and physical activities 6. Evidence for depression or mood disorders 7.  Care team reviewed and updated.   Review of Systems  Constitutional: Negative for fever, activity change, appetite change, fatigue and unexpected weight change.  HENT: Negative.   Respiratory: Negative for cough, chest tightness, shortness of breath and wheezing.   Cardiovascular: Negative for chest pain, palpitations and leg swelling.  Gastrointestinal: Negative for nausea, abdominal pain, diarrhea, constipation and abdominal distention.  Musculoskeletal: Positive for  gait problem. Negative for myalgias and arthralgias.  Skin: Negative.   Neurological: Negative for dizziness, weakness, light-headedness and headaches.  Psychiatric/Behavioral: Negative.       Objective:   Physical Exam  Constitutional: She is oriented to person, place, and time. She appears well-developed and well-nourished.  HENT:  Head: Normocephalic and atraumatic.  Eyes: EOM are normal.  Neck: Normal range of motion.  Cardiovascular: Normal rate and regular rhythm.   Carotids without bruits bilaterally.   Pulmonary/Chest: Effort normal and breath sounds normal. No respiratory distress. She has no wheezes. She has no rales.  Abdominal: Soft. Bowel sounds are normal. She exhibits no distension. There is no tenderness. There is no rebound.  Musculoskeletal: She exhibits no edema.  Neurological: She is alert and oriented to person, place, and time.  Skin: Skin is warm and dry.  Psychiatric: She has a normal mood and affect. Her behavior is normal.   Filed Vitals:   12/21/14 1016  BP: 148/88  Pulse: 72  Temp: 97.8 F (36.6 C)  TempSrc: Oral  Resp: 16  Weight: 123 lb 12.8 oz (56.155 kg)  SpO2: 98%      Assessment & Plan:

## 2014-12-21 NOTE — Assessment & Plan Note (Signed)
Still has some weakness and uses cane to ambulate. Does well overall and no trouble with ADLs.

## 2014-12-21 NOTE — Assessment & Plan Note (Signed)
On losartan and amlodipine. Doing well and home readings at goal, generally a little high at visits but will keep regimen. Reviewed recent BMP and normal.

## 2014-12-21 NOTE — Progress Notes (Signed)
Pre visit review using our clinic review tool, if applicable. No additional management support is needed unless otherwise documented below in the visit note. 

## 2014-12-21 NOTE — Assessment & Plan Note (Signed)
Doing well on amiodarone, in sinus today, on xarelto.

## 2015-02-16 DIAGNOSIS — Z823 Family history of stroke: Secondary | ICD-10-CM | POA: Diagnosis not present

## 2015-02-16 DIAGNOSIS — R55 Syncope and collapse: Secondary | ICD-10-CM | POA: Diagnosis not present

## 2015-02-16 DIAGNOSIS — Z8673 Personal history of transient ischemic attack (TIA), and cerebral infarction without residual deficits: Secondary | ICD-10-CM | POA: Diagnosis not present

## 2015-02-16 DIAGNOSIS — R531 Weakness: Secondary | ICD-10-CM | POA: Diagnosis not present

## 2015-02-16 DIAGNOSIS — I10 Essential (primary) hypertension: Secondary | ICD-10-CM | POA: Diagnosis not present

## 2015-02-26 ENCOUNTER — Other Ambulatory Visit: Payer: Self-pay | Admitting: Internal Medicine

## 2015-03-08 ENCOUNTER — Other Ambulatory Visit: Payer: Self-pay | Admitting: Internal Medicine

## 2015-03-09 ENCOUNTER — Other Ambulatory Visit: Payer: Self-pay

## 2015-03-16 ENCOUNTER — Other Ambulatory Visit: Payer: Self-pay

## 2015-03-16 NOTE — Telephone Encounter (Signed)
patient's daughter called about get xarelto 15 mg approved for the patient . I called [facility] and got a PA done it was approved until 03/15/16 Case # [id number]

## 2015-04-01 ENCOUNTER — Telehealth: Payer: Self-pay

## 2015-04-01 NOTE — Telephone Encounter (Signed)
Prior auth for Xarelto initiated thru [employer].

## 2015-04-06 ENCOUNTER — Telehealth: Payer: Self-pay

## 2015-04-06 NOTE — Telephone Encounter (Signed)
Authorization for Xarelto approved thru [facility]. Good thru 03/15/2016. Patient aware.

## 2015-04-08 DIAGNOSIS — H5203 Hypermetropia, bilateral: Secondary | ICD-10-CM | POA: Diagnosis not present

## 2015-04-18 ENCOUNTER — Other Ambulatory Visit (HOSPITAL_COMMUNITY): Payer: Self-pay

## 2015-04-18 ENCOUNTER — Encounter (HOSPITAL_COMMUNITY): Payer: Self-pay | Admitting: Family Medicine

## 2015-04-18 ENCOUNTER — Emergency Department (HOSPITAL_COMMUNITY)
Admission: EM | Admit: 2015-04-18 | Discharge: 2015-04-18 | Disposition: A | Discharge status: Home / Self Care | Payer: Medicare Other | Attending: Emergency Medicine | Admitting: Emergency Medicine

## 2015-04-18 ENCOUNTER — Emergency Department (HOSPITAL_COMMUNITY): Payer: Medicare Other

## 2015-04-18 DIAGNOSIS — Z23 Encounter for immunization: Secondary | ICD-10-CM | POA: Diagnosis not present

## 2015-04-18 DIAGNOSIS — S0993XA Unspecified injury of face, initial encounter: Secondary | ICD-10-CM | POA: Diagnosis present

## 2015-04-18 DIAGNOSIS — S0181XA Laceration without foreign body of other part of head, initial encounter: Secondary | ICD-10-CM | POA: Diagnosis not present

## 2015-04-18 DIAGNOSIS — Y929 Unspecified place or not applicable: Secondary | ICD-10-CM | POA: Insufficient documentation

## 2015-04-18 DIAGNOSIS — I4891 Unspecified atrial fibrillation: Secondary | ICD-10-CM | POA: Insufficient documentation

## 2015-04-18 DIAGNOSIS — Y939 Activity, unspecified: Secondary | ICD-10-CM | POA: Insufficient documentation

## 2015-04-18 DIAGNOSIS — W19XXXA Unspecified fall, initial encounter: Secondary | ICD-10-CM

## 2015-04-18 DIAGNOSIS — Z8639 Personal history of other endocrine, nutritional and metabolic disease: Secondary | ICD-10-CM | POA: Diagnosis not present

## 2015-04-18 DIAGNOSIS — S0291XA Unspecified fracture of skull, initial encounter for closed fracture: Secondary | ICD-10-CM | POA: Diagnosis not present

## 2015-04-18 DIAGNOSIS — Z8739 Personal history of other diseases of the musculoskeletal system and connective tissue: Secondary | ICD-10-CM | POA: Diagnosis not present

## 2015-04-18 DIAGNOSIS — Z8673 Personal history of transient ischemic attack (TIA), and cerebral infarction without residual deficits: Secondary | ICD-10-CM | POA: Insufficient documentation

## 2015-04-18 DIAGNOSIS — I10 Essential (primary) hypertension: Secondary | ICD-10-CM | POA: Insufficient documentation

## 2015-04-18 DIAGNOSIS — W01198A Fall on same level from slipping, tripping and stumbling with subsequent striking against other object, initial encounter: Secondary | ICD-10-CM | POA: Insufficient documentation

## 2015-04-18 DIAGNOSIS — Z86010 Personal history of colonic polyps: Secondary | ICD-10-CM | POA: Diagnosis not present

## 2015-04-18 DIAGNOSIS — Y999 Unspecified external cause status: Secondary | ICD-10-CM | POA: Insufficient documentation

## 2015-04-18 DIAGNOSIS — Z79899 Other long term (current) drug therapy: Secondary | ICD-10-CM | POA: Insufficient documentation

## 2015-04-18 MED ORDER — TETANUS-DIPHTH-ACELL PERTUSSIS 5-2.5-18.5 LF-MCG/0.5 IM SUSP
0.5000 mL | Freq: Once | INTRAMUSCULAR | Status: AC
  Administered 2015-04-18: 0.5 mL via INTRAMUSCULAR
  Filled 2015-04-18: qty 0.5

## 2015-04-18 NOTE — ED Notes (Signed)
Soft cervical collar placed, pt has no neuro deficits after placement

## 2015-04-18 NOTE — ED Notes (Signed)
Pt ambulated to BR with no difficulty.  

## 2015-04-18 NOTE — Discharge Instructions (Signed)
Facial Laceration ° A facial laceration is a cut on the face. These injuries can be painful and cause bleeding. Lacerations usually heal quickly, but they need special care to reduce scarring. °DIAGNOSIS  °Your health care provider will take a medical history, ask for details about how the injury occurred, and examine the wound to determine how deep the cut is. °TREATMENT  °Some facial lacerations may not require closure. Others may not be able to be closed because of an increased risk of infection. The risk of infection and the chance for successful closure will depend on various factors, including the amount of time since the injury occurred. °The wound may be cleaned to help prevent infection. If closure is appropriate, pain medicines may be given if needed. Your health care provider will use stitches (sutures), wound glue (adhesive), or skin adhesive strips to repair the laceration. These tools bring the skin edges together to allow for faster healing and a better cosmetic outcome. If needed, you may also be given a tetanus shot. °HOME CARE INSTRUCTIONS °· Only take over-the-counter or prescription medicines as directed by your health care provider. °· Follow your health care provider's instructions for wound care. These instructions will vary depending on the technique used for closing the wound. °For Sutures: °· Keep the wound clean and dry.   °· If you were given a bandage (dressing), you should change it at least once a day. Also change the dressing if it becomes wet or dirty, or as directed by your health care provider.   °· Wash the wound with soap and water 2 times a day. Rinse the wound off with water to remove all soap. Pat the wound dry with a clean towel.   °· After cleaning, apply a thin layer of the antibiotic ointment recommended by your health care provider. This will help prevent infection and keep the dressing from sticking.   °· You may shower as usual after the first 24 hours. Do not soak the  wound in water until the sutures are removed.   °· Get your sutures removed as directed by your health care provider. With facial lacerations, sutures should usually be taken out after 4-5 days to avoid stitch marks.   °· Wait a few days after your sutures are removed before applying any makeup. °For Skin Adhesive Strips: °· Keep the wound clean and dry.   °· Do not get the skin adhesive strips wet. You may bathe carefully, using caution to keep the wound dry.   °· If the wound gets wet, pat it dry with a clean towel.   °· Skin adhesive strips will fall off on their own. You may trim the strips as the wound heals. Do not remove skin adhesive strips that are still stuck to the wound. They will fall off in time.   °For Wound Adhesive: °· You may briefly wet your wound in the shower or bath. Do not soak or scrub the wound. Do not swim. Avoid periods of heavy sweating until the skin adhesive has fallen off on its own. After showering or bathing, gently pat the wound dry with a clean towel.   °· Do not apply liquid medicine, cream medicine, ointment medicine, or makeup to your wound while the skin adhesive is in place. This may loosen the film before your wound is healed.   °· If a dressing is placed over the wound, be careful not to apply tape directly over the skin adhesive. This may cause the adhesive to be pulled off before the wound is healed.   °· Avoid   prolonged exposure to sunlight or tanning lamps while the skin adhesive is in place.  The skin adhesive will usually remain in place for 5-10 days, then naturally fall off the skin. Do not pick at the adhesive film.  After Healing: Once the wound has healed, cover the wound with sunscreen during the day for 1 full year. This can help minimize scarring. Exposure to ultraviolet light in the first year will darken the scar. It can take 1-2 years for the scar to lose its redness and to heal completely.  SEEK IMMEDIATE MEDICAL CARE IF:  You have redness, pain, or  swelling around the wound.   You see ayellowish-white fluid (pus) coming from the wound.   You have chills or a fever.  MAKE SURE YOU:  Understand these instructions.  Will watch your condition.  Will get help right away if you are not doing well or get worse. Document Released: 12/28/2004 Document Revised: 09/10/2013 Document Reviewed: 07/03/2013 [facility] Patient Information 2015 [place], [place]. This information is not intended to replace advice given to you by your health care provider. Make sure you discuss any questions you have with your health care provider.  Skull Fracture, Uncomplicated, Adult You have a skull fracture. This happens when one of the bones of your head cracks or breaks. Your injury does not appear serious at this time and we feel you can be observed safely at home. An injury to the head that causes a skull fracture may also cause a concussion. With a concussion you may be knocked out for a brief moment (loss of consciousness). A concussion is the mildest form of traumatic brain injury. The symptoms of a concussion are short-lived and resolve on their own. The most common symptoms are confusion and forgetting what happened (amnesia). SYMPTOMS These minor symptoms may be experienced after discharge:  Memory difficulties.  Dizziness.  Headaches.  Hearing difficulties.  Vertigo or trouble with balance.  Depression.  Tiredness.  Difficulty with concentration.  Nausea.  Vomiting. A bruise on the brain (concussion) requires a few days for recovery the same as a bruise elsewhere on your body. It is common for people with injuries such as yours to experience such problems. Usually these problems disappear without medical care. If symptoms remain for more than a few days, notify your caregiver. See your caregiver sooner if symptoms are becoming worse rather than better. HOME CARE INSTRUCTIONS   During the next 24 hours you should stay with an adult who can  watch you for the above warning signs.  This person should wake you up every 02-03 hours to check on your condition, noting any of the above signs or symptoms. Problems which are getting worse mean you should call or return immediately to the facility where you were just seen, or to the nearest emergency department. In case of emergency or unconsciousness, call for local emergency medical help.  You should take clear liquids for the rest of the day and then resume your regular diet.  You should not take sedatives or alcoholic beverages for 48 hours after discharge.  After injuries such as yours, most serious problems happen within the first 24 hours.  If x-rays or other testing were done, make sure you know how you are to get those results. It is your responsibility to call back for results when your caregiver suggests. Do not assume everything is fine if your caregiver has not been able to reach you.  Skull fractures usually do not need follow up x-rays.  These fractures are not like broken arms. The position of the skull stays the same as when it was broken and usually heals without problems.  If you have a concussion be aware that symptoms may last up to a week or longer.  It is very important to keep any follow-up appointments after a head injury.  It is unlikely that serious side effects will occur. If they do occur it is usually soon after the accident but may occur as long as a week after the accident or injury. SEEK IMMEDIATE MEDICAL CARE IF:   Confusion or drowsiness develops; children frequently become drowsy after any type of trauma or injury.  A person cannot arouse the injured person.  You feel sick to your stomach (nausea) or persistent, forceful vomiting (projectile in nature).  Rapid back and forth movement of the eyes. This is called nystagmus.  Convulsions, seizures, or unconsciousness.  Severe persistent headaches not relieved by medication. Only take over-the-counter  or prescription medicines for pain, discomfort, or fever as directed by your caregiver. (Do not take aspirin as this weakens blood clotting abilities).  Inability to use arms or legs appropriately.  Changes in pupil sizes.  Clear or bloody discharge from nose or ears. Document Released: 09/20/2004 Document Revised: 02/12/2012 Document Reviewed: 01/26/2009 [facility] Patient Information 2015 [place], [place]. This information is not intended to replace advice given to you by your health care provider. Make sure you discuss any questions you have with your health care provider.

## 2015-04-18 NOTE — ED Notes (Signed)
Pt sts that she tripped and fell into a cement block. Pt has lacerations to face. Pt on blood thinners. Denies LOC.

## 2015-04-18 NOTE — ED Notes (Signed)
ASSESSMENT: Left cheek abrasions noted, bleeding controlled Center left forehead avulsion laceration noted, flap closed, bleeding controlled. Pt awake, alert, oriented.  Mae, [place].  Follows commands.  No neuro deficits noted, no other complaints

## 2015-04-18 NOTE — ED Provider Notes (Signed)
CSN: [id number]     Arrival date & time 04/18/15  1441 History   First MD Initiated Contact with Patient 04/18/15 1501     Chief Complaint  Patient presents with  . Fall     (Consider location/radiation/quality/duration/timing/severity/associated sxs/prior Treatment) Patient is a 81 y.o. female presenting with fall. The history is provided by the patient.  Fall Pertinent negatives include no chest pain, no abdominal pain, no headaches and no shortness of breath.   patient tripped and fell striking her face. Also went her hands but states she has no pain on her hands. She is on anticoagulation for atrial fibrillation. No headache. No confusion no loss of conscious. No chest pain. No dizziness. No change in her vision. She has abrasion to her face and laceration/abrasion to her forehead.  Past Medical History  Diagnosis Date  . HTN (hypertension)   . History of colonic polyps   . Cervical arthritis   . Hip arthritis   . COLONIC POLYPS, HX OF 09/24/2007  . SUPRAVENTRICULAR ARRHYTHMIA 12/20/2010    s/p RFCA 05/2010-Dr. Lovena Le  . Hyperlipidemia   . Atrial fibrillation   . History of stroke   . H/O: hysterectomy     fibroid tumors  . Stroke 02/2010   Past Surgical History  Procedure Laterality Date  . Pilonidal cyst excision    . Breast lumpectomy      left  . Fb removal from popliteal fossae    . Intracapsular cataract extraction       Left eye Oct 18, 2009  right eye Jan '11 By Dr Era Skeen  . Colon surgery  03/19/2009    colon cancer   . Transthoracic echocardiogram  2007  . Abdominal hysterectomy     Family History  Problem Relation Age of Onset  . Heart attack Mother   . Stroke Mother   . Coronary artery disease Mother   . Heart disease Mother   . Cancer Father     Colon Cancer  . Diabetes Daughter   . Cancer Daughter     Leukemia   History  Substance Use Topics  . Smoking status: Never Smoker   . Smokeless tobacco: Never Used  . Alcohol Use: Yes      Comment: rare   OB History    No data available     Review of Systems  Constitutional: Negative for activity change and appetite change.  Eyes: Negative for pain.  Respiratory: Negative for chest tightness and shortness of breath.   Cardiovascular: Negative for chest pain and leg swelling.  Gastrointestinal: Negative for nausea, vomiting, abdominal pain and diarrhea.  Genitourinary: Negative for flank pain.  Musculoskeletal: Negative for back pain and neck stiffness.  Skin: Positive for wound. Negative for rash.  Neurological: Negative for weakness, numbness and headaches.  Hematological: Bruises/bleeds easily.  Psychiatric/Behavioral: Negative for behavioral problems.      Allergies  Codeine  Home Medications   Prior to Admission medications   Medication Sig Start Date End Date Taking? Authorizing Provider  amiodarone (PACERONE) 200 MG tablet Take 200mg  Mon-Thurs and 100mg  Fri-Sun Patient taking differently: Take 100 mg by mouth daily. Take 1/2 tablet (100 mg) twice daily Monday thru Thursday, take 1/2 tablet (100 mg) every morning Friday thru Sunday 11/11/14  Yes Evans Lance, MD  amLODipine (NORVASC) 5 MG tablet TAKE 1 TABLET DAILY 11/20/14  Yes Evans Lance, MD  folic acid-pyridoxine-cyancobalamin (FOLBIC) 2.5-25-2 MG TABS TAKE 1 TABLET DAILY Patient taking differently: Take 1 tablet  by mouth at bedtime.  08/25/14  Yes Biagio Borg, MD  losartan (COZAAR) 100 MG tablet TAKE 1 TABLET DAILY Patient taking differently: TAKE 1 TABLET DAILY AT BEDTIME 02/26/15  Yes Evans Lance, MD  nitroGLYCERIN (NITROSTAT) 0.4 MG SL tablet Place 0.4 mg under the tongue every 5 (five) minutes as needed for chest pain (MAX 3 TABLETS).    Yes Historical Provider, MD  polyethylene glycol (MIRALAX / GLYCOLAX) packet Take 17 g by mouth daily as needed (constipation).    Yes Historical Provider, MD  XARELTO 15 MG TABS tablet TAKE 1 TABLET DAILY 03/09/15  Yes Sueanne Margarita, MD   BP 145/69 mmHg   Pulse 66  Temp(Src) 97.5 F (36.4 C) (Oral)  Resp 17  Ht 5\' 2"  (1.575 m)  Wt 120 lb (54.432 kg)  BMI 21.94 kg/m2  SpO2 96% Physical Exam  Constitutional: She appears well-developed and well-nourished.  HENT:  Abrasion to left cheek and left side of face. Slight oozing. On middle of forehead there is a complex contusion/laceration. Face is stable. Ocular movements intact.  Eyes: EOM are normal. Pupils are equal, round, and reactive to light.  Neck: Neck supple.  Cardiovascular: Normal rate.   Pulmonary/Chest: Effort normal.  Abdominal: Soft.  Musculoskeletal:  Abrasion to palm of left hand without underlying tenderness  Neurological: She is alert.  Skin: Skin is warm.    ED Course  Procedures (including critical care time) Labs Review Labs Reviewed - No data to display  Imaging Review Ct Head Wo Contrast  04/18/2015   CLINICAL DATA:  TRIPPED AND FELL ON CEMENT BLOCK, HEMATOMA/LAC TO LEFT FRONTAL REGION AND LEFT MAXILLARY REGION  EXAM: CT HEAD WITHOUT CONTRAST  CT MAXILLOFACIAL WITHOUT CONTRAST  TECHNIQUE: Multidetector CT imaging of the head and maxillofacial structures were performed using the standard protocol without intravenous contrast. Multiplanar CT image reconstructions of the maxillofacial structures were also generated.  COMPARISON:  Head CT 10/24/2014  FINDINGS: CT HEAD FINDINGS  No intracranial hemorrhage. No parenchymal contusion. No midline shift or mass effect. Basilar cisterns are patent. No skull base fracture. No fluid in the paranasal sinuses or mastoid air cells. Orbits are normal.  There is a remote infarction in the left medial temporal lobe and basal ganglia, again demonstrated. There is generalized cortical atrophy.  CT MAXILLOFACIAL FINDINGS  There soft tissue swelling over the medial aspect of the left orbit. No evidence orbital wall fracture. The intraconal contents are normal. No proptosis. No nasal bone fracture. Zygomatic arches are intact. Mandibular  condyles are located. No mandibular bone fracture. The maxillary sinus walls are intact. No fluid in the access sinuses. Pterygoid plates normal.  On the coronal projection there is a small linear lucency through the medial occipital condyles seen on images 60 through through 63 of coronal series 305. This consistent with a small depressed nondisplaced fracture. This is along the articular surface with the C1 vertebral body. No additional evidence of cervical spine fracture which is image to C4 anteriorly.  IMPRESSION: 1. No intracranial trauma. 2. Remote left temporal infarction. 3. Soft tissue swelling over the medial left orbit. 4. No evidence of orbital fracture or facial fracture. 5. Small nondisplaced fracture of the most medial aspect of the occipital condyle on the right. Findings conveyed toNATHAN  on 04/18/2015  at16:47.   Electronically Signed   By: Suzy Bouchard M.D.   On: 04/18/2015 16:48   Ct Maxillofacial Wo Cm  04/18/2015   CLINICAL DATA:  TRIPPED AND FELL  ON CEMENT BLOCK, HEMATOMA/LAC TO LEFT FRONTAL REGION AND LEFT MAXILLARY REGION  EXAM: CT HEAD WITHOUT CONTRAST  CT MAXILLOFACIAL WITHOUT CONTRAST  TECHNIQUE: Multidetector CT imaging of the head and maxillofacial structures were performed using the standard protocol without intravenous contrast. Multiplanar CT image reconstructions of the maxillofacial structures were also generated.  COMPARISON:  Head CT 10/24/2014  FINDINGS: CT HEAD FINDINGS  No intracranial hemorrhage. No parenchymal contusion. No midline shift or mass effect. Basilar cisterns are patent. No skull base fracture. No fluid in the paranasal sinuses or mastoid air cells. Orbits are normal.  There is a remote infarction in the left medial temporal lobe and basal ganglia, again demonstrated. There is generalized cortical atrophy.  CT MAXILLOFACIAL FINDINGS  There soft tissue swelling over the medial aspect of the left orbit. No evidence orbital wall fracture. The  intraconal contents are normal. No proptosis. No nasal bone fracture. Zygomatic arches are intact. Mandibular condyles are located. No mandibular bone fracture. The maxillary sinus walls are intact. No fluid in the access sinuses. Pterygoid plates normal.  On the coronal projection there is a small linear lucency through the medial occipital condyles seen on images 60 through through 63 of coronal series 305. This consistent with a small depressed nondisplaced fracture. This is along the articular surface with the C1 vertebral body. No additional evidence of cervical spine fracture which is image to C4 anteriorly.  IMPRESSION: 1. No intracranial trauma. 2. Remote left temporal infarction. 3. Soft tissue swelling over the medial left orbit. 4. No evidence of orbital fracture or facial fracture. 5. Small nondisplaced fracture of the most medial aspect of the occipital condyle on the right. Findings conveyed toNATHAN  on 04/18/2015  at16:47.   Electronically Signed   By: Suzy Bouchard M.D.   On: 04/18/2015 16:48     EKG Interpretation None      MDM   Final diagnoses:  Fall, initial encounter  Skull fracture, closed, initial encounter  Facial laceration, initial encounter    Patient with mechanical fall. Had a facial laceration that was closed with Dermabond. Also found to have occipital condyle fracture. Reviewed CT with Dr. Ronnald Ramp from neurosurgery and he wrecked recommended a soft cervical collar. Will discharge home to follow-up with him in approximately 2 weeks.  LACERATION REPAIR Performed by: Mackie Pai Authorized by: Mackie Pai Consent: Verbal consent obtained. Risks and benefits: risks, benefits and alternatives were discussed Consent given by: patient Patient identity confirmed: provided demographic data Prepped and Draped in normal sterile fashion Wound explored  Laceration Location: forehead  Laceration Length: 2cm  No Foreign Bodies seen or  palpated   Alcohol scrub  Skin closure: Tissue adhesive   Good wound approximation   Patient tolerance: Patient tolerated the procedure well with no immediate complications.     Davonna Belling, MD 04/18/15 2351

## 2015-05-26 ENCOUNTER — Other Ambulatory Visit: Payer: Self-pay | Admitting: Internal Medicine

## 2015-05-26 NOTE — Telephone Encounter (Signed)
folbic rx sent to pharm

## 2015-08-24 ENCOUNTER — Other Ambulatory Visit: Payer: Self-pay | Admitting: Internal Medicine

## 2015-09-29 DIAGNOSIS — C44712 Basal cell carcinoma of skin of right lower limb, including hip: Secondary | ICD-10-CM | POA: Diagnosis not present

## 2015-09-29 DIAGNOSIS — C44612 Basal cell carcinoma of skin of right upper limb, including shoulder: Secondary | ICD-10-CM | POA: Diagnosis not present

## 2015-10-06 DIAGNOSIS — Z8639 Personal history of other endocrine, nutritional and metabolic disease: Secondary | ICD-10-CM | POA: Insufficient documentation

## 2015-10-06 DIAGNOSIS — I10 Essential (primary) hypertension: Secondary | ICD-10-CM | POA: Insufficient documentation

## 2015-10-06 DIAGNOSIS — R944 Abnormal results of kidney function studies: Secondary | ICD-10-CM | POA: Insufficient documentation

## 2015-10-06 DIAGNOSIS — I4891 Unspecified atrial fibrillation: Secondary | ICD-10-CM | POA: Insufficient documentation

## 2015-10-06 DIAGNOSIS — Z8673 Personal history of transient ischemic attack (TIA), and cerebral infarction without residual deficits: Secondary | ICD-10-CM | POA: Insufficient documentation

## 2015-10-06 DIAGNOSIS — Z86010 Personal history of colonic polyps: Secondary | ICD-10-CM | POA: Insufficient documentation

## 2015-10-06 DIAGNOSIS — R7989 Other specified abnormal findings of blood chemistry: Secondary | ICD-10-CM | POA: Diagnosis not present

## 2015-10-06 DIAGNOSIS — Z8739 Personal history of other diseases of the musculoskeletal system and connective tissue: Secondary | ICD-10-CM | POA: Insufficient documentation

## 2015-10-06 DIAGNOSIS — Z79899 Other long term (current) drug therapy: Secondary | ICD-10-CM | POA: Diagnosis not present

## 2015-10-06 DIAGNOSIS — Z7901 Long term (current) use of anticoagulants: Secondary | ICD-10-CM | POA: Insufficient documentation

## 2015-10-06 DIAGNOSIS — R51 Headache: Secondary | ICD-10-CM | POA: Diagnosis not present

## 2015-10-07 ENCOUNTER — Encounter: Payer: Self-pay | Admitting: Family

## 2015-10-07 ENCOUNTER — Emergency Department (HOSPITAL_COMMUNITY)
Admission: EM | Admit: 2015-10-07 | Discharge: 2015-10-07 | Disposition: A | Discharge status: Home / Self Care | Payer: Medicare Other | Attending: Emergency Medicine | Admitting: Emergency Medicine

## 2015-10-07 ENCOUNTER — Encounter (HOSPITAL_COMMUNITY): Payer: Self-pay | Admitting: *Deleted

## 2015-10-07 ENCOUNTER — Ambulatory Visit (INDEPENDENT_AMBULATORY_CARE_PROVIDER_SITE_OTHER): Payer: Medicare Other | Admitting: Family

## 2015-10-07 ENCOUNTER — Emergency Department (HOSPITAL_COMMUNITY): Payer: Medicare Other

## 2015-10-07 VITALS — BP 122/62 | HR 44 | Temp 97.7°F | Resp 16 | Ht 62.0 in | Wt 121.0 lb

## 2015-10-07 DIAGNOSIS — R7989 Other specified abnormal findings of blood chemistry: Secondary | ICD-10-CM

## 2015-10-07 DIAGNOSIS — I10 Essential (primary) hypertension: Secondary | ICD-10-CM

## 2015-10-07 DIAGNOSIS — R51 Headache: Secondary | ICD-10-CM | POA: Diagnosis not present

## 2015-10-07 LAB — CBC WITH DIFFERENTIAL/PLATELET
Basophils Absolute: 0 10*3/uL (ref 0.0–0.1)
Basophils Relative: 1 %
Eosinophils Absolute: 0.1 10*3/uL (ref 0.0–0.7)
Eosinophils Relative: 1 %
HCT: 39.4 % (ref 36.0–46.0)
Hemoglobin: 13.1 g/dL (ref 12.0–15.0)
Lymphocytes Relative: 39 %
Lymphs Abs: 2.4 10*3/uL (ref 0.7–4.0)
MCH: 28.5 pg (ref 26.0–34.0)
MCHC: 33.2 g/dL (ref 30.0–36.0)
MCV: 85.8 fL (ref 78.0–100.0)
Monocytes Absolute: 0.7 10*3/uL (ref 0.1–1.0)
Monocytes Relative: 10 %
Neutro Abs: 3.1 10*3/uL (ref 1.7–7.7)
Neutrophils Relative %: 49 %
Platelets: 234 10*3/uL (ref 150–400)
RBC: 4.59 MIL/uL (ref 3.87–5.11)
RDW: 15.5 % (ref 11.5–15.5)
WBC: 6.3 10*3/uL (ref 4.0–10.5)

## 2015-10-07 LAB — COMPREHENSIVE METABOLIC PANEL
ALT: 21 U/L (ref 14–54)
AST: 33 U/L (ref 15–41)
Albumin: 4.2 g/dL (ref 3.5–5.0)
Alkaline Phosphatase: 57 U/L (ref 38–126)
Anion gap: 14 (ref 5–15)
BUN: 22 mg/dL — ABNORMAL HIGH (ref 6–20)
CO2: 18 mmol/L — ABNORMAL LOW (ref 22–32)
Calcium: 10.2 mg/dL (ref 8.9–10.3)
Chloride: 106 mmol/L (ref 101–111)
Creatinine, Ser: 1.56 mg/dL — ABNORMAL HIGH (ref 0.44–1.00)
GFR calc Af Amer: 34 mL/min — ABNORMAL LOW (ref >60)
GFR calc non Af Amer: 29 mL/min — ABNORMAL LOW (ref >60)
Glucose, Bld: 116 mg/dL — ABNORMAL HIGH (ref 65–99)
Potassium: 4.4 mmol/L (ref 3.5–5.1)
Sodium: 138 mmol/L (ref 135–145)
Total Bilirubin: 0.5 mg/dL (ref 0.3–1.2)
Total Protein: 7.2 g/dL (ref 6.5–8.1)

## 2015-10-07 LAB — TROPONIN I: Troponin I: <0.03 ng/mL (ref <0.031)

## 2015-10-07 MED ORDER — HYDRALAZINE HCL 20 MG/ML IJ SOLN
5.0000 mg | Freq: Once | INTRAMUSCULAR | Status: DC
Start: 2015-10-07 — End: 2015-10-07

## 2015-10-07 NOTE — ED Notes (Signed)
The pt is c/o high bp since yesterday and feeling worse tonight

## 2015-10-07 NOTE — Progress Notes (Signed)
Pre visit review using our clinic review tool, if applicable. No additional management support is needed unless otherwise documented below in the visit note. 

## 2015-10-07 NOTE — Assessment & Plan Note (Signed)
Hypertension appears stable with current regimen following elevated pressure last evening. Cardiovascular exam is benign. Continue current dosage of losartan and amlodipine. Monitor blood pressure at home. Does have decreased kidney function which is most likely related to dehydration. Check BMET at next office visit. Encouraged adequate hydration and attempts to maintain controlled blood pressure.

## 2015-10-07 NOTE — Progress Notes (Signed)
Subjective:    Patient ID: [name], female    DOB: May 10, 1928, 81 y.o.   MRN: [id number]  Chief Complaint  Patient presents with  . Hospitalization Follow-up    HPI:  TANINA BARB is a 81 y.o. female who  has a past medical history of HTN (hypertension); History of colonic polyps; Cervical arthritis ([place]); Hip arthritis; COLONIC POLYPS, HX OF (09/24/2007); SUPRAVENTRICULAR ARRHYTHMIA (12/20/2010); Hyperlipidemia; Atrial fibrillation ([place]); History of stroke; H/O: hysterectomy; and Stroke ([place]) (02/2010). and presents today for an office visit following an ED visit.  Recently evaluated in the ED for elevated blood pressure of 210/100 with the associated symptoms of a headache and flushed face. CT of the head with no acute intracranial process and stable chronic changed including a left MCA territory infarct. ECG showed normal sinus rhythm with borderline prolonged PR interval. Treated with IV hydralazine and blood pressure was decreased to 153/60. There was no evidence of hypertensive emergency. All ED records were reviewed in detail.   Hypertension is currently maintained on losartan and amlodipine. Takes the medication as prescribed and denies adverse side effects. Denies any current symptoms of end organ damage or headaches.   BP Readings from Last 3 Encounters:  10/07/15 122/62  10/07/15 153/60  04/18/15 145/69    Allergies  Allergen Reactions  . Codeine Rash    fine rash     Current Outpatient Prescriptions on File Prior to Visit  Medication Sig Dispense Refill  . amiodarone (PACERONE) 200 MG tablet Take 1/2 tablet (100 mg) twice daily Monday thru Thursday, take 1/2 tablet (100 mg) every morning Friday thru Sunday 90 tablet 1  . amLODipine (NORVASC) 5 MG tablet TAKE 1 TABLET DAILY 90 tablet 3  . folic acid-pyridoxine-cyancobalamin (FOLBIC) 2.5-25-2 MG TABS Take 1 tablet by mouth daily. 90 tablet 1  . losartan (COZAAR) 100 MG tablet TAKE 1 TABLET DAILY 90 tablet 1    . nitroGLYCERIN (NITROSTAT) 0.4 MG SL tablet Place 0.4 mg under the tongue every 5 (five) minutes as needed for chest pain (MAX 3 TABLETS).     . polyethylene glycol (MIRALAX / GLYCOLAX) packet Take 17 g by mouth daily as needed (constipation).     Alveda Reasons 15 MG TABS tablet TAKE 1 TABLET DAILY 90 tablet 3   No current facility-administered medications on file prior to visit.    Review of Systems  Constitutional: Negative for fever, chills and diaphoresis.  Eyes:       Negative for changes in vision.   Respiratory: Negative for chest tightness and shortness of breath.   Cardiovascular: Negative for chest pain, palpitations and leg swelling.  Neurological: Negative for dizziness and headaches.      Objective:    BP 122/62 mmHg  Pulse 44  Temp(Src) 97.7 F (36.5 C) (Oral)  Resp 16  Ht 5\' 2"  (1.575 m)  Wt 121 lb (54.885 kg)  BMI 22.13 kg/m2  SpO2 93% Nursing note and vital signs reviewed.  Physical Exam  Constitutional: She is oriented to person, place, and time. She appears well-developed and well-nourished. No distress.  Cardiovascular: Normal rate, regular rhythm, normal heart sounds and intact distal pulses.   Pulmonary/Chest: Effort normal and breath sounds normal.  Neurological: She is alert and oriented to person, place, and time.  Skin: Skin is warm and dry.  Psychiatric: She has a normal mood and affect. Her behavior is normal. Judgment and thought content normal.       Assessment & Plan:  Problem List Items Addressed This Visit      Cardiovascular and Mediastinum   Essential hypertension - Primary    Hypertension appears stable with current regimen following elevated pressure last evening. Cardiovascular exam is benign. Continue current dosage of losartan and amlodipine. Monitor blood pressure at home. Does have decreased kidney function which is most likely related to dehydration. Check BMET at next office visit. Encouraged adequate hydration and attempts to  maintain controlled blood pressure.

## 2015-10-07 NOTE — ED Provider Notes (Signed)
CSN: [id number]     Arrival date & time 10/06/15  2353 History  By signing my name below, I, Sonum Patel, attest that this documentation has been prepared under the direction and in the presence of Merryl Hacker, MD. Electronically Signed: Sonum Patel, Education administrator. 10/07/2015. 12:38 AM.    Chief Complaint  Patient presents with  . Hypertension    The history is provided by the patient. No language interpreter was used.     HPI Comments: [name] is a 81 y.o. female with past medical history of HTN who presents to the Emergency Department complaining tonight of an episode of elevated blood pressure of 210/100's. She states she woke up 24 hours ago feeling unwell with an associated throbbing HA and flushed face. She states her BP usually runs 160's/70-80. She denies recent changes to her blood pressure medications and has been compliant with her doses. She denies any modifying factors. She denies numbness, tingling, weakness, SOB, CP.   PCP Sharlet Salina   Past Medical History  Diagnosis Date  . HTN (hypertension)   . History of colonic polyps   . Cervical arthritis ([place])   . Hip arthritis   . COLONIC POLYPS, HX OF 09/24/2007  . SUPRAVENTRICULAR ARRHYTHMIA 12/20/2010    s/p RFCA 05/2010-Dr. Lovena Le  . Hyperlipidemia   . Atrial fibrillation ([place])   . History of stroke   . H/O: hysterectomy     fibroid tumors  . Stroke [facility]) 02/2010   Past Surgical History  Procedure Laterality Date  . Pilonidal cyst excision    . Breast lumpectomy      left  . Fb removal from popliteal fossae    . Intracapsular cataract extraction       Left eye Oct 18, 2009  right eye Jan '11 By Dr Era Skeen  . Colon surgery  03/19/2009    colon cancer   . Transthoracic echocardiogram  2007  . Abdominal hysterectomy     Family History  Problem Relation Age of Onset  . Heart attack Mother   . Stroke Mother   . Coronary artery disease Mother   . Heart disease Mother   . Cancer Father     Colon Cancer   . Diabetes Daughter   . Cancer Daughter     Leukemia   Social History  Substance Use Topics  . Smoking status: Never Smoker   . Smokeless tobacco: Never Used  . Alcohol Use: Yes     Comment: rare   OB History    No data available     Review of Systems  Constitutional:       +elevated blood pressure  Skin: Positive for color change (face flushed).  Neurological: Positive for headaches.  All other systems reviewed and are negative.     Allergies  Codeine  Home Medications   Prior to Admission medications   Medication Sig Start Date End Date Taking? Authorizing Provider  amiodarone (PACERONE) 200 MG tablet Take 1/2 tablet (100 mg) twice daily Monday thru Thursday, take 1/2 tablet (100 mg) every morning Friday thru Sunday 05/26/15  Yes Evans Lance, MD  amLODipine (NORVASC) 5 MG tablet TAKE 1 TABLET DAILY 11/20/14  Yes Evans Lance, MD  folic acid-pyridoxine-cyancobalamin (FOLBIC) 2.5-25-2 MG TABS Take 1 tablet by mouth daily. 08/24/15  Yes Hoyt Koch, MD  losartan (COZAAR) 100 MG tablet TAKE 1 TABLET DAILY 08/24/15  Yes Evans Lance, MD  nitroGLYCERIN (NITROSTAT) 0.4 MG SL tablet Place 0.4 mg  under the tongue every 5 (five) minutes as needed for chest pain (MAX 3 TABLETS).    Yes Historical Provider, MD  polyethylene glycol (MIRALAX / GLYCOLAX) packet Take 17 g by mouth daily as needed (constipation).    Yes Historical Provider, MD  XARELTO 15 MG TABS tablet TAKE 1 TABLET DAILY 03/09/15  Yes Sueanne Margarita, MD   BP 153/60 mmHg  Pulse 58  Temp(Src) 97.9 F (36.6 C) (Oral)  Resp 11  Ht 5\' 2"  (1.575 m)  Wt 120 lb (54.432 kg)  BMI 21.94 kg/m2  SpO2 99% Physical Exam  Constitutional: She is oriented to person, place, and time. No distress.  Elderly  HENT:  Head: Normocephalic and atraumatic.  Flushed face noted  Eyes: Pupils are equal, round, and reactive to light.  Cardiovascular: Normal rate, regular rhythm and normal heart sounds.   No murmur  heard. Pulmonary/Chest: Effort normal and breath sounds normal. No respiratory distress. She has no wheezes.  Abdominal: Soft. There is no tenderness.  Neurological: She is alert and oriented to person, place, and time.  5 out of 5 strength in all 4 extremities, normal reflexes, cranial nerves II through XII intact  Skin: Skin is warm and dry.  Psychiatric: She has a normal mood and affect.  Nursing note and vitals reviewed.   ED Course  Procedures (including critical care time)  DIAGNOSTIC STUDIES: Oxygen Saturation is 99% on RA, normal by my interpretation.    COORDINATION OF CARE: 12:38 AM Discussed treatment plan with pt at bedside and pt agreed to plan.   Labs Review Labs Reviewed  COMPREHENSIVE METABOLIC PANEL - Abnormal; Notable for the following:    CO2 18 (*)    Glucose, Bld 116 (*)    BUN 22 (*)    Creatinine, Ser 1.56 (*)    GFR calc non Af Amer 29 (*)    GFR calc Af Amer 34 (*)    All other components within normal limits  CBC WITH DIFFERENTIAL/PLATELET  TROPONIN I    Imaging Review Ct Head Wo Contrast  10/07/2015  CLINICAL DATA:  Hypertension and bad headache for 24 hours. History of hypertension, hyperlipidemia, atrial fibrillation and stroke. EXAM: CT HEAD WITHOUT CONTRAST TECHNIQUE: Contiguous axial images were obtained from the base of the skull through the vertex without intravenous contrast. COMPARISON:  CT head Apr 18, 2015 FINDINGS: No intraparenchymal hemorrhage, mass effect, midline shift or acute large vascular territory infarct. LEFT frontotemporal encephalomalacia extending to the axilla. LEFT basal ganglia cystic lacunar infarct. Ex vacuo dilatation LEFT lateral ventricle. Ventricles and sulci are otherwise normal for patient's age. Mild white matter changes compatible chronic small vessel ischemic disease. No abnormal extra-axial fluid collections. Basal cisterns are patent. Moderate calcific atherosclerosis of the carotid siphons. No skull fracture. The  included ocular globes and orbital contents are non-suspicious. Status post bilateral ocular lens implants. The mastoid aircells and included paranasal sinuses are well-aerated. IMPRESSION: No acute intracranial process. Stable chronic changes including old LEFT MCA territory infarct. Electronically Signed   By: Elon Alas M.D.   On: 10/07/2015 03:33   I have personally reviewed and evaluated these images and lab results as part of my medical decision-making.   EKG Interpretation   Date/Time:  Thursday October 07 2015 00:29:00 EDT Ventricular Rate:  81 PR Interval:  222 QRS Duration: 102 QT Interval:  417 QTC Calculation: 484 R Axis:   -53 Text Interpretation:  Sinus rhythm Borderline prolonged PR interval Left  anterior fascicular block Abnormal  R-wave progression, late transition LVH  with secondary repolarization abnormality No significant change since last  tracing Confirmed by   MD, Clawson ([id number]) on 10/07/2015 1:36:48 AM      MDM   Final diagnoses:  Essential hypertension  Elevated serum creatinine    Patient presents with concerns for high blood pressure.  Reports ringing in her ears. Initial blood pressure 191/68. She has taken her medications tonight. Denies any chest pain or shortness of breath. EKG is unchanged from prior. Basic labwork obtained. Basic lab work notable for creatinine of 1.56 which is approximately double her last known baseline. It is comparable to 2 years ago however. The acuity of this is unknown. Otherwise troponin and lab work is reassuring.  CT head shows no bleed. Patient reports improvement of symptoms. She was given IV hydralazine and blood pressures have improved.  Blood pressure this time 153/60. She is asymptomatic. Patient wishes to be discharged home. She has no evidence of hypertensive emergency or urgency. Discuss follow-up closely with her primary physician for repeat BMP and blood pressure medication adjustment. Patient stated  understanding.  After history, exam, and medical workup I feel the patient has been appropriately medically screened and is safe for discharge home. Pertinent diagnoses were discussed with the patient. Patient was given return precautions.  I personally performed the services described in this documentation, which was scribed in my presence. The recorded information has been reviewed and is accurate.   Merryl Hacker, MD 10/07/15 [phone]

## 2015-10-07 NOTE — Discharge Instructions (Signed)
Hypertension Hypertension, commonly called high blood pressure, is when the force of blood pumping through your arteries is too strong. Your arteries are the blood vessels that carry blood from your heart throughout your body. A blood pressure reading consists of a higher number over a lower number, such as 110/72. The higher number (systolic) is the pressure inside your arteries when your heart pumps. The lower number (diastolic) is the pressure inside your arteries when your heart relaxes. Ideally you want your blood pressure below 120/80. Hypertension forces your heart to work harder to pump blood. Your arteries may become narrow or stiff. Having untreated or uncontrolled hypertension can cause heart attack, stroke, kidney disease, and other problems. RISK FACTORS Some risk factors for high blood pressure are controllable. Others are not.  Risk factors you cannot control include:   Race. You may be at higher risk if you are African American.  Age. Risk increases with age.  Gender. Men are at higher risk than women before age 45 years. After age 65, women are at higher risk than men. Risk factors you can control include:  Not getting enough exercise or physical activity.  Being overweight.  Getting too much fat, sugar, calories, or salt in your diet.  Drinking too much alcohol. SIGNS AND SYMPTOMS Hypertension does not usually cause signs or symptoms. Extremely high blood pressure (hypertensive crisis) may cause headache, anxiety, shortness of breath, and nosebleed. DIAGNOSIS To check if you have hypertension, your health care provider will measure your blood pressure while you are seated, with your arm held at the level of your heart. It should be measured at least twice using the same arm. Certain conditions can cause a difference in blood pressure between your right and left arms. A blood pressure reading that is higher than normal on one occasion does not mean that you need treatment. If  it is not clear whether you have high blood pressure, you may be asked to return on a different day to have your blood pressure checked again. Or, you may be asked to monitor your blood pressure at home for 1 or more weeks. TREATMENT Treating high blood pressure includes making lifestyle changes and possibly taking medicine. Living a healthy lifestyle can help lower high blood pressure. You may need to change some of your habits. Lifestyle changes may include:  Following the DASH diet. This diet is high in fruits, vegetables, and whole grains. It is low in salt, red meat, and added sugars.  Keep your sodium intake below 2,300 mg per day.  Getting at least 30-45 minutes of aerobic exercise at least 4 times per week.  Losing weight if necessary.  Not smoking.  Limiting alcoholic beverages.  Learning ways to reduce stress. Your health care provider may prescribe medicine if lifestyle changes are not enough to get your blood pressure under control, and if one of the following is true:  You are 18-59 years of age and your systolic blood pressure is above 140.  You are 60 years of age or older, and your systolic blood pressure is above 150.  Your diastolic blood pressure is above 90.  You have diabetes, and your systolic blood pressure is over 140 or your diastolic blood pressure is over 90.  You have kidney disease and your blood pressure is above 140/90.  You have heart disease and your blood pressure is above 140/90. Your personal target blood pressure may vary depending on your medical conditions, your age, and other factors. HOME CARE INSTRUCTIONS    Have your blood pressure rechecked as directed by your health care provider.   Take medicines only as directed by your health care provider. Follow the directions carefully. Blood pressure medicines must be taken as prescribed. The medicine does not work as well when you skip doses. Skipping doses also puts you at risk for  problems.  Do not smoke.   Monitor your blood pressure at home as directed by your health care provider. SEEK MEDICAL CARE IF:   You think you are having a reaction to medicines taken.  You have recurrent headaches or feel dizzy.  You have swelling in your ankles.  You have trouble with your vision. SEEK IMMEDIATE MEDICAL CARE IF:  You develop a severe headache or confusion.  You have unusual weakness, numbness, or feel faint.  You have severe chest or abdominal pain.  You vomit repeatedly.  You have trouble breathing. MAKE SURE YOU:   Understand these instructions.  Will watch your condition.  Will get help right away if you are not doing well or get worse.   This information is not intended to replace advice given to you by your health care provider. Make sure you discuss any questions you have with your health care provider.   Document Released: 11/20/2005 Document Revised: 04/06/2015 Document Reviewed: 09/12/2013 Elsevier Interactive Patient Education 2016 [employer]  

## 2015-10-07 NOTE — Patient Instructions (Signed)
Thank you for choosing [facility].  Summary/Instructions:  Please continue to take your medications as prescribed.   Your prescription(s) have been submitted to your pharmacy or been printed and provided for you. Please take as directed and contact our office if you believe you are having problem(s) with the medication(s) or have any questions.  If your symptoms worsen or fail to improve, please contact our office for further instruction, or in case of emergency go directly to the emergency room at the closest medical facility.     

## 2015-10-19 ENCOUNTER — Emergency Department (HOSPITAL_COMMUNITY)
Admission: EM | Admit: 2015-10-19 | Discharge: 2015-10-19 | Disposition: A | Discharge status: Home / Self Care | Payer: Medicare Other | Attending: Emergency Medicine | Admitting: Emergency Medicine

## 2015-10-19 ENCOUNTER — Encounter (HOSPITAL_COMMUNITY): Payer: Self-pay | Admitting: Emergency Medicine

## 2015-10-19 ENCOUNTER — Telehealth: Payer: Self-pay

## 2015-10-19 DIAGNOSIS — Z8739 Personal history of other diseases of the musculoskeletal system and connective tissue: Secondary | ICD-10-CM | POA: Diagnosis not present

## 2015-10-19 DIAGNOSIS — Z8673 Personal history of transient ischemic attack (TIA), and cerebral infarction without residual deficits: Secondary | ICD-10-CM | POA: Insufficient documentation

## 2015-10-19 DIAGNOSIS — I10 Essential (primary) hypertension: Secondary | ICD-10-CM | POA: Diagnosis not present

## 2015-10-19 DIAGNOSIS — Z90710 Acquired absence of both cervix and uterus: Secondary | ICD-10-CM | POA: Insufficient documentation

## 2015-10-19 DIAGNOSIS — Z8639 Personal history of other endocrine, nutritional and metabolic disease: Secondary | ICD-10-CM | POA: Insufficient documentation

## 2015-10-19 DIAGNOSIS — Z86010 Personal history of colonic polyps: Secondary | ICD-10-CM | POA: Diagnosis not present

## 2015-10-19 DIAGNOSIS — Z7901 Long term (current) use of anticoagulants: Secondary | ICD-10-CM | POA: Diagnosis not present

## 2015-10-19 LAB — BASIC METABOLIC PANEL
Anion gap: 11 (ref 5–15)
BUN: 17 mg/dL (ref 6–20)
CO2: 23 mmol/L (ref 22–32)
Calcium: 9.7 mg/dL (ref 8.9–10.3)
Chloride: 103 mmol/L (ref 101–111)
Creatinine, Ser: 1.14 mg/dL — ABNORMAL HIGH (ref 0.44–1.00)
GFR calc Af Amer: 49 mL/min — ABNORMAL LOW (ref >60)
GFR calc non Af Amer: 43 mL/min — ABNORMAL LOW (ref >60)
Glucose, Bld: 135 mg/dL — ABNORMAL HIGH (ref 65–99)
Potassium: 4.1 mmol/L (ref 3.5–5.1)
Sodium: 137 mmol/L (ref 135–145)

## 2015-10-19 LAB — I-STAT TROPONIN, ED: Troponin i, poc: 0 ng/mL (ref 0.00–0.08)

## 2015-10-19 LAB — GLUCOSE, POCT (MANUAL RESULT ENTRY): POC Glucose: 103 mg/dl — AB (ref 70–99)

## 2015-10-19 MED ORDER — AMLODIPINE BESYLATE 10 MG PO TABS: 10.0000 mg | ORAL_TABLET | Freq: Every day | ORAL | Status: DC

## 2015-10-19 NOTE — Discharge Instructions (Signed)
Hypertension Hypertension, commonly called high blood pressure, is when the force of blood pumping through your arteries is too strong. Your arteries are the blood vessels that carry blood from your heart throughout your body. A blood pressure reading consists of a higher number over a lower number, such as 110/72. The higher number (systolic) is the pressure inside your arteries when your heart pumps. The lower number (diastolic) is the pressure inside your arteries when your heart relaxes. Ideally you want your blood pressure below 120/80. Hypertension forces your heart to work harder to pump blood. Your arteries may become narrow or stiff. Having untreated or uncontrolled hypertension can cause heart attack, stroke, kidney disease, and other problems. RISK FACTORS Some risk factors for high blood pressure are controllable. Others are not.  Risk factors you cannot control include:   Race. You may be at higher risk if you are African American.  Age. Risk increases with age.  Gender. Men are at higher risk than women before age 45 years. After age 65, women are at higher risk than men. Risk factors you can control include:  Not getting enough exercise or physical activity.  Being overweight.  Getting too much fat, sugar, calories, or salt in your diet.  Drinking too much alcohol. SIGNS AND SYMPTOMS Hypertension does not usually cause signs or symptoms. Extremely high blood pressure (hypertensive crisis) may cause headache, anxiety, shortness of breath, and nosebleed. DIAGNOSIS To check if you have hypertension, your health care provider will measure your blood pressure while you are seated, with your arm held at the level of your heart. It should be measured at least twice using the same arm. Certain conditions can cause a difference in blood pressure between your right and left arms. A blood pressure reading that is higher than normal on one occasion does not mean that you need treatment. If  it is not clear whether you have high blood pressure, you may be asked to return on a different day to have your blood pressure checked again. Or, you may be asked to monitor your blood pressure at home for 1 or more weeks. TREATMENT Treating high blood pressure includes making lifestyle changes and possibly taking medicine. Living a healthy lifestyle can help lower high blood pressure. You may need to change some of your habits. Lifestyle changes may include:  Following the DASH diet. This diet is high in fruits, vegetables, and whole grains. It is low in salt, red meat, and added sugars.  Keep your sodium intake below 2,300 mg per day.  Getting at least 30-45 minutes of aerobic exercise at least 4 times per week.  Losing weight if necessary.  Not smoking.  Limiting alcoholic beverages.  Learning ways to reduce stress. Your health care provider may prescribe medicine if lifestyle changes are not enough to get your blood pressure under control, and if one of the following is true:  You are 18-59 years of age and your systolic blood pressure is above 140.  You are 60 years of age or older, and your systolic blood pressure is above 150.  Your diastolic blood pressure is above 90.  You have diabetes, and your systolic blood pressure is over 140 or your diastolic blood pressure is over 90.  You have kidney disease and your blood pressure is above 140/90.  You have heart disease and your blood pressure is above 140/90. Your personal target blood pressure may vary depending on your medical conditions, your age, and other factors. HOME CARE INSTRUCTIONS    Have your blood pressure rechecked as directed by your health care provider.   Take medicines only as directed by your health care provider. Follow the directions carefully. Blood pressure medicines must be taken as prescribed. The medicine does not work as well when you skip doses. Skipping doses also puts you at risk for  problems.  Do not smoke.   Monitor your blood pressure at home as directed by your health care provider. SEEK MEDICAL CARE IF:   You think you are having a reaction to medicines taken.  You have recurrent headaches or feel dizzy.  You have swelling in your ankles.  You have trouble with your vision. SEEK IMMEDIATE MEDICAL CARE IF:  You develop a severe headache or confusion.  You have unusual weakness, numbness, or feel faint.  You have severe chest or abdominal pain.  You vomit repeatedly.  You have trouble breathing. MAKE SURE YOU:   Understand these instructions.  Will watch your condition.  Will get help right away if you are not doing well or get worse.   This information is not intended to replace advice given to you by your health care provider. Make sure you discuss any questions you have with your health care provider.   Document Released: 11/20/2005 Document Revised: 04/06/2015 Document Reviewed: 09/12/2013 Elsevier Interactive Patient Education 2016 [employer]  

## 2015-10-19 NOTE — Telephone Encounter (Signed)
[name] with [facility] is at a flu clinic given at [facility]----patient came for flu vaccine but has blood pressure of 204/83 and very very flushed face----patient advised to go to the ED, patient states she is going to [facility] ED----routed to dr Sharlet Salina, fyi.Marland KitchenMarland KitchenMarland Kitchen

## 2015-10-19 NOTE — ED Provider Notes (Signed)
CSN: [id number]     Arrival date & time 10/19/15  1657 History   First MD Initiated Contact with Patient 10/19/15 1755     Chief Complaint  Patient presents with  . Hypertension     (Consider location/radiation/quality/duration/timing/severity/associated sxs/prior Treatment) Patient is a 81 y.o. female presenting with hypertension. The history is provided by the patient, a relative and medical records.  Hypertension This is a recurrent problem. The current episode started today. The problem occurs intermittently. The problem has been unchanged. Pertinent negatives include no abdominal pain, chest pain, fever, headaches or rash. Associated symptoms comments: Facial flushing . Nothing aggravates the symptoms. Treatments tried: Rx medications. The treatment provided mild relief.    Past Medical History  Diagnosis Date  . HTN (hypertension)   . History of colonic polyps   . Cervical arthritis ([place])   . Hip arthritis   . COLONIC POLYPS, HX OF 09/24/2007  . SUPRAVENTRICULAR ARRHYTHMIA 12/20/2010    s/p RFCA 05/2010-Dr. Lovena Le  . Hyperlipidemia   . Atrial fibrillation ([place])   . History of stroke   . H/O: hysterectomy     fibroid tumors  . Stroke [facility]) 02/2010   Past Surgical History  Procedure Laterality Date  . Pilonidal cyst excision    . Breast lumpectomy      left  . Fb removal from popliteal fossae    . Intracapsular cataract extraction       Left eye Oct 18, 2009  right eye Jan '11 By Dr Era Skeen  . Colon surgery  03/19/2009    colon cancer   . Transthoracic echocardiogram  2007  . Abdominal hysterectomy     Family History  Problem Relation Age of Onset  . Heart attack Mother   . Stroke Mother   . Coronary artery disease Mother   . Heart disease Mother   . Cancer Father     Colon Cancer  . Diabetes Daughter   . Cancer Daughter     Leukemia   Social History  Substance Use Topics  . Smoking status: Never Smoker   . Smokeless tobacco: Never Used  . Alcohol  Use: No     Comment: rare   OB History    No data available     Review of Systems  Constitutional: Negative for fever.  HENT: Negative for facial swelling.   Respiratory: Negative for shortness of breath.   Cardiovascular: Negative for chest pain.  Gastrointestinal: Negative for abdominal pain.  Genitourinary: Negative for dysuria.  Musculoskeletal: Negative for back pain.  Skin: Negative for rash.  Neurological: Negative for headaches.  Psychiatric/Behavioral: Negative for confusion.      Allergies  Codeine  Home Medications   Prior to Admission medications   Medication Sig Start Date End Date Taking? Authorizing Provider  amiodarone (PACERONE) 200 MG tablet Take 1/2 tablet (100 mg) twice daily Monday thru Thursday, take 1/2 tablet (100 mg) every morning Friday thru Sunday Patient taking differently: Take 100 mg by mouth See admin instructions. Take 1/2 tablet (100 mg) twice daily Monday thru Thursday, take 1/2 tablet (100 mg) every morning Friday thru Sunday 05/26/15  Yes Evans Lance, MD  folic acid-pyridoxine-cyancobalamin (FOLBIC) 2.5-25-2 MG TABS Take 1 tablet by mouth daily. Patient taking differently: Take 1 tablet by mouth at bedtime.  08/24/15  Yes Hoyt Koch, MD  losartan (COZAAR) 100 MG tablet TAKE 1 TABLET DAILY Patient taking differently: TAKE 100 MG BY MOUTH DAILY 08/24/15  Yes Evans Lance, MD  nitroGLYCERIN (NITROSTAT) 0.4 MG SL tablet Place 0.4 mg under the tongue every 5 (five) minutes as needed for chest pain (MAX 3 TABLETS).    Yes Historical Provider, MD  polyethylene glycol (MIRALAX / GLYCOLAX) packet Take 17 g by mouth daily as needed (constipation).    Yes Historical Provider, MD  XARELTO 15 MG TABS tablet TAKE 1 TABLET DAILY Patient taking differently: TAKE 15 MG BY MOUTH DAILY 03/09/15  Yes Sueanne Margarita, MD  amLODipine (NORVASC) 10 MG tablet Take 1 tablet (10 mg total) by mouth daily. 10/19/15   Hoyle Sauer, MD   BP 196/85 mmHg  Pulse  69  Temp(Src) 97.5 F (36.4 C) (Oral)  Resp 18  Ht 5\' 2"  (1.575 m)  Wt 121 lb (54.885 kg)  BMI 22.13 kg/m2  SpO2 100% Physical Exam  Constitutional: She is oriented to person, place, and time. She appears well-developed and well-nourished. No distress.  HENT:  Head: Normocephalic and atraumatic.  Right Ear: External ear normal.  Left Ear: External ear normal.  Nose: Nose normal.  Mouth/Throat: Oropharynx is clear and moist. No oropharyngeal exudate.  Eyes: Conjunctivae and EOM are normal. Pupils are equal, round, and reactive to light. Right eye exhibits no discharge. Left eye exhibits no discharge. No scleral icterus.  Neck: Normal range of motion. Neck supple. No JVD present. No tracheal deviation present. No thyromegaly present.  Cardiovascular: Normal rate, regular rhythm and intact distal pulses.  Exam reveals no gallop and no friction rub.   No murmur heard. Pulmonary/Chest: Effort normal and breath sounds normal. No stridor. No respiratory distress. She has no wheezes. She has no rales. She exhibits no tenderness.  Abdominal: Soft. She exhibits no distension. There is no tenderness.  Musculoskeletal: Normal range of motion. She exhibits no edema or tenderness.  Lymphadenopathy:    She has no cervical adenopathy.  Neurological: She is alert and oriented to person, place, and time.  Skin: Skin is warm and dry. No rash noted. She is not diaphoretic. No erythema. No pallor.  Psychiatric: She has a normal mood and affect. Her behavior is normal. Judgment and thought content normal.  Nursing note and vitals reviewed.   ED Course  Procedures (including critical care time) Labs Review Labs Reviewed  BASIC METABOLIC PANEL - Abnormal; Notable for the following:    Glucose, Bld 135 (*)    Creatinine, Ser 1.14 (*)    GFR calc non Af Amer 43 (*)    GFR calc Af Amer 49 (*)    All other components within normal limits  I-STAT TROPOININ, ED    EKG Interpretation   Date/Time:   Tuesday October 19 2015 19:54:49 EST Ventricular Rate:  57 PR Interval:  202 QRS Duration: 100 QT Interval:  467 QTC Calculation: 455 R Axis:   -54 Text Interpretation:  Sinus rhythm Left anterior fascicular block RSR' in  V1 or V2, right VCD or RVH No significant change since last tracing  Confirmed by FLOYD MD, DANIEL [phone]) on 10/19/2015 9:08:50 PM      MDM   Final diagnoses:  Essential hypertension    [name] is a 81 y.o. female patient presenting for evaluation for hypertension.  Pt was recently w/u for the same and states she feels the same as she did during her last evaluation.    Pt had elevated Cr on last visit.  Will get a troponin for comparison and repeat BMP.  ECG reviewed and is w/o acute concerning findings.  Lab w/u  is reassuring.  I will increase her amlodipine and have her follow up with her PCP, as her pressures have been consistently elevated.  I gave her precautions for hypotension and advised her to check her BP throughout the day and log to be able to show to PCP.  Instructions explained to pt daughter as well.  Patient was given return precautions for hypertension.  Pt advised on use of medications as applicable.  Advised to return for actely worsening symptoms, inability to take medications, or other acute concerns.  Advised to follow up with PCP in 2-3 days.  Patient and family were in agreement with and expressed understanding of follow plan, plan of care, and return precautions.  All questions answered prior to discharge.  Patient was discharged in stable condition with family, ambulating without difficulty.   Patient care was discussed with my attending, Dr. Tyrone Nine.   Hoyle Sauer, MD 10/21/15 [place], DO 10/21/15 [phone]

## 2015-10-19 NOTE — ED Notes (Signed)
Pt reports she was seen recently for same. Pt had her blood pressure checked today by her nurse and it was elevated. PCP advised them to come back to the ED. BP 196/73 earlier today. Pt face appears red and flushed.

## 2015-10-20 NOTE — Telephone Encounter (Signed)
Pt daughter called and wanted to know if a new BP script could be called in with the higher dose to her mail order?

## 2015-10-20 NOTE — Telephone Encounter (Signed)
Spoke to patient and she needs an er follow up visit first.

## 2015-11-08 ENCOUNTER — Ambulatory Visit: Payer: Self-pay | Admitting: Internal Medicine

## 2015-12-01 ENCOUNTER — Other Ambulatory Visit: Payer: Self-pay | Admitting: Internal Medicine

## 2015-12-23 ENCOUNTER — Ambulatory Visit (INDEPENDENT_AMBULATORY_CARE_PROVIDER_SITE_OTHER): Payer: Medicare Other | Admitting: Internal Medicine

## 2015-12-23 ENCOUNTER — Other Ambulatory Visit (INDEPENDENT_AMBULATORY_CARE_PROVIDER_SITE_OTHER): Payer: Medicare Other

## 2015-12-23 ENCOUNTER — Encounter: Payer: Self-pay | Admitting: Internal Medicine

## 2015-12-23 VITALS — BP 160/62 | HR 68 | Temp 98.3°F | Resp 12 | Ht 62.0 in | Wt 123.0 lb

## 2015-12-23 DIAGNOSIS — I482 Chronic atrial fibrillation, unspecified: Secondary | ICD-10-CM

## 2015-12-23 DIAGNOSIS — I10 Essential (primary) hypertension: Secondary | ICD-10-CM | POA: Diagnosis not present

## 2015-12-23 DIAGNOSIS — Z0000 Encounter for general adult medical examination without abnormal findings: Secondary | ICD-10-CM | POA: Diagnosis not present

## 2015-12-23 DIAGNOSIS — E785 Hyperlipidemia, unspecified: Secondary | ICD-10-CM | POA: Diagnosis not present

## 2015-12-23 DIAGNOSIS — Z23 Encounter for immunization: Secondary | ICD-10-CM | POA: Diagnosis not present

## 2015-12-23 LAB — LIPID PANEL
Cholesterol: 214 mg/dL — ABNORMAL HIGH (ref 0–200)
HDL: 101.8 mg/dL (ref >39.00)
LDL Cholesterol: 99 mg/dL (ref 0–99)
NonHDL: 112.24
Total CHOL/HDL Ratio: 2
Triglycerides: 67 mg/dL (ref 0.0–149.0)
VLDL: 13.4 mg/dL (ref 0.0–40.0)

## 2015-12-23 LAB — COMPREHENSIVE METABOLIC PANEL
ALT: 17 U/L (ref 0–35)
AST: 20 U/L (ref 0–37)
Albumin: 4.4 g/dL (ref 3.5–5.2)
Alkaline Phosphatase: 60 U/L (ref 39–117)
BUN: 24 mg/dL — ABNORMAL HIGH (ref 6–23)
CO2: 25 mEq/L (ref 19–32)
Calcium: 10.2 mg/dL (ref 8.4–10.5)
Chloride: 104 mEq/L (ref 96–112)
Creatinine, Ser: 1.23 mg/dL — ABNORMAL HIGH (ref 0.40–1.20)
GFR: 44.04 mL/min — ABNORMAL LOW (ref >60.00)
Glucose, Bld: 152 mg/dL — ABNORMAL HIGH (ref 70–99)
Potassium: 3.8 mEq/L (ref 3.5–5.1)
Sodium: 138 mEq/L (ref 135–145)
Total Bilirubin: 0.4 mg/dL (ref 0.2–1.2)
Total Protein: 7.5 g/dL (ref 6.0–8.3)

## 2015-12-23 LAB — T4, FREE: Free T4: 1.39 ng/dL (ref 0.60–1.60)

## 2015-12-23 LAB — TSH: TSH: 1.39 u[IU]/mL (ref 0.35–4.50)

## 2015-12-23 MED ORDER — AMIODARONE HCL 200 MG PO TABS: ORAL_TABLET | ORAL | Status: DC

## 2015-12-23 MED ORDER — RIVAROXABAN 15 MG PO TABS: 15.0000 mg | ORAL_TABLET | Freq: Every day | ORAL | Status: DC

## 2015-12-23 MED ORDER — AMLODIPINE BESYLATE 10 MG PO TABS
10.0000 mg | ORAL_TABLET | Freq: Every day | ORAL | Status: DC
Start: 2015-12-23 — End: 2015-12-23

## 2015-12-23 MED ORDER — AMLODIPINE BESYLATE 10 MG PO TABS: 10.0000 mg | ORAL_TABLET | Freq: Every day | ORAL | Status: DC

## 2015-12-23 MED ORDER — LOSARTAN POTASSIUM 100 MG PO TABS: 100.0000 mg | ORAL_TABLET | Freq: Every day | ORAL | Status: DC

## 2015-12-23 NOTE — Progress Notes (Signed)
   Subjective:    Patient ID: [name], female    DOB: 1928/06/29, 82 y.o.   MRN: [id number]  HPI Here for medicare wellness, no new complaints. Please see A/P for status and treatment of chronic medical problems.   Diet: heart health Physical activity: sedentary Depression/mood screen: negative Hearing: intact to whispered voice Visual acuity: grossly normal, performs annual eye exam  ADLs: capable Fall risk: none Home safety: good Cognitive evaluation: intact to orientation, naming, recall and repetition EOL planning: adv directives discussed, in place  I have personally reviewed and have noted 1. The patient's medical and social history - reviewed today no changes 2. Their use of alcohol, tobacco or illicit drugs 3. Their current medications and supplements 4. The patient's functional ability including ADL's, fall risks, home safety risks and hearing or visual impairment. 5. Diet and physical activities 6. Evidence for depression or mood disorders 7. Care team reviewed and updated (available in snapshot)  Review of Systems  Constitutional: Negative for fever, activity change, appetite change, fatigue and unexpected weight change.  HENT: Negative.   Eyes: Negative.   Respiratory: Negative for cough, chest tightness, shortness of breath and wheezing.   Cardiovascular: Negative for chest pain, palpitations and leg swelling.  Gastrointestinal: Negative for nausea, abdominal pain, diarrhea, constipation and abdominal distention.  Musculoskeletal: Positive for gait problem. Negative for myalgias and arthralgias.  Skin: Negative.   Neurological: Negative for dizziness, weakness, light-headedness and headaches.  Psychiatric/Behavioral: Negative.       Objective:   Physical Exam  Constitutional: She is oriented to person, place, and time. She appears well-developed and well-nourished.  HENT:  Head: Normocephalic and atraumatic.  Eyes: EOM are normal.  Neck: Normal range  of motion.  Cardiovascular: Normal rate and regular rhythm.   Carotids without bruits bilaterally.   Pulmonary/Chest: Effort normal and breath sounds normal. No respiratory distress. She has no wheezes. She has no rales.  Abdominal: Soft. Bowel sounds are normal. She exhibits no distension. There is no tenderness. There is no rebound.  Musculoskeletal: She exhibits no edema.  Neurological: She is alert and oriented to person, place, and time.  Skin: Skin is warm and dry.  Psychiatric: She has a normal mood and affect. Her behavior is normal.   Filed Vitals:   12/23/15 1402  BP: 160/62  Pulse: 68  Temp: 98.3 F (36.8 C)  TempSrc: Oral  Resp: 12  Height: 5\' 2"  (1.575 m)  Weight: 123 lb (55.792 kg)  SpO2: 98%      Assessment & Plan:  Pneumonia 23 given at visit.

## 2015-12-23 NOTE — Patient Instructions (Signed)
We have sent in to your mail order the medicines. We have also sent in a 2 week supply of the amlodipine to the [facility].   We have given you the pneumonia shot today to finish those.   Health Maintenance, Female Adopting a healthy lifestyle and getting preventive care can go a long way to promote health and wellness. Talk with your health care provider about what schedule of regular examinations is right for you. This is a good chance for you to check in with your provider about disease prevention and staying healthy. In between checkups, there are plenty of things you can do on your own. Experts have done a lot of research about which lifestyle changes and preventive measures are most likely to keep you healthy. Ask your health care provider for more information. WEIGHT AND DIET  Eat a healthy diet  Be sure to include plenty of vegetables, fruits, low-fat dairy products, and lean protein.  Do not eat a lot of foods high in solid fats, added sugars, or salt.  Get regular exercise. This is one of the most important things you can do for your health.  Most adults should exercise for at least 150 minutes each week. The exercise should increase your heart rate and make you sweat (moderate-intensity exercise).  Most adults should also do strengthening exercises at least twice a week. This is in addition to the moderate-intensity exercise.  Maintain a healthy weight  Body mass index (BMI) is a measurement that can be used to identify possible weight problems. It estimates body fat based on height and weight. Your health care provider can help determine your BMI and help you achieve or maintain a healthy weight.  For females 59 years of age and older:   A BMI below 18.5 is considered underweight.  A BMI of 18.5 to 24.9 is normal.  A BMI of 25 to 29.9 is considered overweight.  A BMI of 30 and above is considered obese.  Watch levels of cholesterol and blood lipids  You should start  having your blood tested for lipids and cholesterol at 80 years of age, then have this test every 5 years.  You may need to have your cholesterol levels checked more often if:  Your lipid or cholesterol levels are high.  You are older than 80 years of age.  You are at high risk for heart disease.  CANCER SCREENING   Lung Cancer  Lung cancer screening is recommended for adults 59-68 years old who are at high risk for lung cancer because of a history of smoking.  A yearly low-dose CT scan of the lungs is recommended for people who:  Currently smoke.  Have quit within the past 15 years.  Have at least a 30-pack-year history of smoking. A pack year is smoking an average of one pack of cigarettes a day for 1 year.  Yearly screening should continue until it has been 15 years since you quit.  Yearly screening should stop if you develop a health problem that would prevent you from having lung cancer treatment.  Breast Cancer  Practice breast self-awareness. This means understanding how your breasts normally appear and feel.  It also means doing regular breast self-exams. Let your health care provider know about any changes, no matter how small.  If you are in your 20s or 30s, you should have a clinical breast exam (CBE) by a health care provider every 1-3 years as part of a regular health exam.  If you  are 73 or older, have a CBE every year. Also consider having a breast X-ray (mammogram) every year.  If you have a family history of breast cancer, talk to your health care provider about genetic screening.  If you are at high risk for breast cancer, talk to your health care provider about having an MRI and a mammogram every year.  Breast cancer gene (BRCA) assessment is recommended for women who have family members with BRCA-related cancers. BRCA-related cancers include:  Breast.  Ovarian.  Tubal.  Peritoneal cancers.  Results of the assessment will determine the need for  genetic counseling and BRCA1 and BRCA2 testing. Cervical Cancer Your health care provider may recommend that you be screened regularly for cancer of the pelvic organs (ovaries, uterus, and vagina). This screening involves a pelvic examination, including checking for microscopic changes to the surface of your cervix (Pap test). You may be encouraged to have this screening done every 3 years, beginning at age 42.  For women ages 3-65, health care providers may recommend pelvic exams and Pap testing every 3 years, or they may recommend the Pap and pelvic exam, combined with testing for human papilloma virus (HPV), every 5 years. Some types of HPV increase your risk of cervical cancer. Testing for HPV may also be done on women of any age with unclear Pap test results.  Other health care providers may not recommend any screening for nonpregnant women who are considered low risk for pelvic cancer and who do not have symptoms. Ask your health care provider if a screening pelvic exam is right for you.  If you have had past treatment for cervical cancer or a condition that could lead to cancer, you need Pap tests and screening for cancer for at least 20 years after your treatment. If Pap tests have been discontinued, your risk factors (such as having a new sexual partner) need to be reassessed to determine if screening should resume. Some women have medical problems that increase the chance of getting cervical cancer. In these cases, your health care provider may recommend more frequent screening and Pap tests. Colorectal Cancer  This type of cancer can be detected and often prevented.  Routine colorectal cancer screening usually begins at 80 years of age and continues through 80 years of age.  Your health care provider may recommend screening at an earlier age if you have risk factors for colon cancer.  Your health care provider may also recommend using home test kits to check for hidden blood in the  stool.  A small camera at the end of a tube can be used to examine your colon directly (sigmoidoscopy or colonoscopy). This is done to check for the earliest forms of colorectal cancer.  Routine screening usually begins at age 9.  Direct examination of the colon should be repeated every 5-10 years through 80 years of age. However, you may need to be screened more often if early forms of precancerous polyps or small growths are found. Skin Cancer  Check your skin from head to toe regularly.  Tell your health care provider about any new moles or changes in moles, especially if there is a change in a mole's shape or color.  Also tell your health care provider if you have a mole that is larger than the size of a pencil eraser.  Always use sunscreen. Apply sunscreen liberally and repeatedly throughout the day.  Protect yourself by wearing long sleeves, pants, a wide-brimmed hat, and sunglasses whenever you are outside.  HEART DISEASE, DIABETES, AND HIGH BLOOD PRESSURE   High blood pressure causes heart disease and increases the risk of stroke. High blood pressure is more likely to develop in:  People who have blood pressure in the high end of the normal range (130-139/85-89 mm Hg).  People who are overweight or obese.  People who are African American.  If you are 18-39 years of age, have your blood pressure checked every 3-5 years. If you are 40 years of age or older, have your blood pressure checked every year. You should have your blood pressure measured twice--once when you are at a hospital or clinic, and once when you are not at a hospital or clinic. Record the average of the two measurements. To check your blood pressure when you are not at a hospital or clinic, you can use:  An automated blood pressure machine at a pharmacy.  A home blood pressure monitor.  If you are between 55 years and 79 years old, ask your health care provider if you should take aspirin to prevent  strokes.  Have regular diabetes screenings. This involves taking a blood sample to check your fasting blood sugar level.  If you are at a normal weight and have a low risk for diabetes, have this test once every three years after 80 years of age.  If you are overweight and have a high risk for diabetes, consider being tested at a younger age or more often. PREVENTING INFECTION  Hepatitis B  If you have a higher risk for hepatitis B, you should be screened for this virus. You are considered at high risk for hepatitis B if:  You were born in a country where hepatitis B is common. Ask your health care provider which countries are considered high risk.  Your parents were born in a high-risk country, and you have not been immunized against hepatitis B (hepatitis B vaccine).  You have HIV or AIDS.  You use needles to inject street drugs.  You live with someone who has hepatitis B.  You have had sex with someone who has hepatitis B.  You get hemodialysis treatment.  You take certain medicines for conditions, including cancer, organ transplantation, and autoimmune conditions. Hepatitis C  Blood testing is recommended for:  Everyone born from 1945 through 1965.  Anyone with known risk factors for hepatitis C. Sexually transmitted infections (STIs)  You should be screened for sexually transmitted infections (STIs) including gonorrhea and chlamydia if:  You are sexually active and are younger than 80 years of age.  You are older than 80 years of age and your health care provider tells you that you are at risk for this type of infection.  Your sexual activity has changed since you were last screened and you are at an increased risk for chlamydia or gonorrhea. Ask your health care provider if you are at risk.  If you do not have HIV, but are at risk, it may be recommended that you take a prescription medicine daily to prevent HIV infection. This is called pre-exposure prophylaxis  (PrEP). You are considered at risk if:  You are sexually active and do not regularly use condoms or know the HIV status of your partner(s).  You take drugs by injection.  You are sexually active with a partner who has HIV. Talk with your health care provider about whether you are at high risk of being infected with HIV. If you choose to begin PrEP, you should first be tested for HIV. You   should then be tested every 3 months for as long as you are taking PrEP.  PREGNANCY   If you are premenopausal and you may become pregnant, ask your health care provider about preconception counseling.  If you may become pregnant, take 400 to 800 micrograms (mcg) of folic acid every day.  If you want to prevent pregnancy, talk to your health care provider about birth control (contraception). OSTEOPOROSIS AND MENOPAUSE   Osteoporosis is a disease in which the bones lose minerals and strength with aging. This can result in serious bone fractures. Your risk for osteoporosis can be identified using a bone density scan.  If you are 65 years of age or older, or if you are at risk for osteoporosis and fractures, ask your health care provider if you should be screened.  Ask your health care provider whether you should take a calcium or vitamin D supplement to lower your risk for osteoporosis.  Menopause may have certain physical symptoms and risks.  Hormone replacement therapy may reduce some of these symptoms and risks. Talk to your health care provider about whether hormone replacement therapy is right for you.  HOME CARE INSTRUCTIONS   Schedule regular health, dental, and eye exams.  Stay current with your immunizations.   Do not use any tobacco products including cigarettes, chewing tobacco, or electronic cigarettes.  If you are pregnant, do not drink alcohol.  If you are breastfeeding, limit how much and how often you drink alcohol.  Limit alcohol intake to no more than 1 drink per day for  nonpregnant women. One drink equals 12 ounces of beer, 5 ounces of wine, or 1 ounces of hard liquor.  Do not use street drugs.  Do not share needles.  Ask your health care provider for help if you need support or information about quitting drugs.  Tell your health care provider if you often feel depressed.  Tell your health care provider if you have ever been abused or do not feel safe at home.   This information is not intended to replace advice given to you by your health care provider. Make sure you discuss any questions you have with your health care provider.   Document Released: 06/05/2011 Document Revised: 12/11/2014 Document Reviewed: 10/22/2013 Elsevier Interactive Patient Education 2016 [employer]  

## 2015-12-23 NOTE — Assessment & Plan Note (Addendum)
Given pneumonia 23 today at visit, flu up to date, immunizations up to date. Colonoscopy aged out and has had dexa in the past but does not feel she needs another. Counseled on home safety and the need to use her cane at all times. Given 10 year screening recommendations at visit.

## 2015-12-23 NOTE — Progress Notes (Signed)
Pre visit review using our clinic review tool, if applicable. No additional management support is needed unless otherwise documented below in the visit note. 

## 2015-12-26 NOTE — Assessment & Plan Note (Signed)
BP above goal today but typically better control. She is taking amlodipine, losartan, for the blood pressure. If still elevated next visit will increase regimen. Checking labs today and adjust as needed.

## 2016-02-14 ENCOUNTER — Ambulatory Visit: Payer: Medicare Other

## 2016-02-14 VITALS — BP 164/60 | Ht 61.0 in | Wt 124.5 lb

## 2016-02-14 DIAGNOSIS — Z0000 Encounter for general adult medical examination without abnormal findings: Secondary | ICD-10-CM

## 2016-02-14 NOTE — Progress Notes (Signed)
Medical screening examination/treatment/procedure(s) were performed by non-physician practitioner and as supervising physician I was immediately available for consultation/collaboration. I agree with above. Elizabeth A Crawford, MD 

## 2016-02-14 NOTE — Patient Instructions (Addendum)
[name] , Thank you for taking time to come for your Medicare Wellness Visit. I appreciate your ongoing commitment to your health goals. Please review the following plan we discussed and let me know if I can assist you in the future.   Will have eye exam soon   These are the goals we discussed: Goals    . patient     Going to a balance class; keep exercising;  Exercise with stretch bands        This is a list of the screening recommended for you and due dates:  Health Maintenance  Topic Date Due  . DEXA scan (bone density measurement)  04/26/1995  . Flu Shot  07/04/2016  . Tetanus Vaccine  04/17/2025  . Shingles Vaccine  Completed  . Pneumonia vaccines  Completed   Bone Densitometry Bone densitometry is an imaging test that uses a special X-ray to measure the amount of calcium and other minerals in your bones (bone density). This test is also known as a bone mineral density test or dual-energy X-ray absorptiometry (DXA). The test can measure bone density at your hip and your spine. It is similar to having a regular X-ray. You may have this test to:  Diagnose a condition that causes weak or thin bones (osteoporosis).  Predict your risk of a broken bone (fracture).  Determine how well osteoporosis treatment is working. LET [facility] CARE PROVIDER KNOW ABOUT:  Any allergies you have.  All medicines you are taking, including vitamins, herbs, eye drops, creams, and over-the-counter medicines.  Previous problems you or members of your family have had with the use of anesthetics.  Any blood disorders you have.  Previous surgeries you have had.  Medical conditions you have.  Possibility of pregnancy.  Any other medical test you had within the previous 14 days that used contrast material. RISKS AND COMPLICATIONS Generally, this is a safe procedure. However, problems can occur and may include the following:  This test exposes you to a very small amount of  radiation.  The risks of radiation exposure may be greater to unborn children. BEFORE THE PROCEDURE  Do not take any calcium supplements for 24 hours before having the test. You can otherwise eat and drink what you usually do.  Take off all metal jewelry, eyeglasses, dental appliances, and any other metal objects. PROCEDURE  You may lie on an exam table. There will be an X-ray generator below you and an imaging device above you.  Other devices, such as boxes or braces, may be used to position your body properly for the scan.  You will need to lie still while the machine slowly scans your body.  The images will show up on a computer monitor. AFTER THE PROCEDURE You may need more testing at a later time.   This information is not intended to replace advice given to you by your health care provider. Make sure you discuss any questions you have with your health care provider.   Document Released: 12/12/2004 Document Revised: 12/11/2014 Document Reviewed: 04/30/2014 Elsevier Interactive Patient Education 2016 [employer].    Osteoporosis Osteoporosis is the thinning and loss of density in the bones. Osteoporosis makes the bones more brittle, fragile, and likely to break (fracture). Over time, osteoporosis can cause the bones to become so weak that they fracture after a simple fall. The bones most likely to fracture are the bones in the hip, wrist, and spine. CAUSES  The exact cause is not known. RISK FACTORS  Anyone can develop osteoporosis. You may be at greater risk if you have a family history of the condition or have poor nutrition. You may also have a higher risk if you are:   Female.   17 years old or older.  A smoker.  Not physically active.   White or Asian.  Slender. SIGNS AND SYMPTOMS  A fracture might be the first sign of the disease, especially if it results from a fall or injury that would not usually cause a bone to break. Other signs and symptoms include:    Low back and neck pain.  Stooped posture.  Height loss. DIAGNOSIS  To make a diagnosis, your health care provider may:  Take a medical history.  Perform a physical exam.  Order tests, such as:  A bone mineral density test.  A dual-energy X-ray absorptiometry test. TREATMENT  The goal of osteoporosis treatment is to strengthen your bones to reduce your risk of a fracture. Treatment may involve:  Making lifestyle changes, such as:  Eating a diet rich in calcium.  Doing weight-bearing and muscle-strengthening exercises.  Stopping tobacco use.  Limiting alcohol intake.  Taking medicine to slow the process of bone loss or to increase bone density.  Monitoring your levels of calcium and vitamin D. HOME CARE INSTRUCTIONS  Include calcium and vitamin D in your diet. Calcium is important for bone health, and vitamin D helps the body absorb calcium.  Perform weight-bearing and muscle-strengthening exercises as directed by your health care provider.  Do not use any tobacco products, including cigarettes, chewing tobacco, and electronic cigarettes. If you need help quitting, ask your health care provider.  Limit your alcohol intake.  Take medicines only as directed by your health care provider.  Keep all follow-up visits as directed by your health care provider. This is important.  Take precautions at home to lower your risk of falling, such as:  Keeping rooms well lit and clutter free.  Installing safety rails on stairs.  Using rubber mats in the bathroom and other areas that are often wet or slippery. SEEK IMMEDIATE MEDICAL CARE IF:  You fall or injure yourself.    This information is not intended to replace advice given to you by your health care provider. Make sure you discuss any questions you have with your health care provider.   Document Released: 08/30/2005 Document Revised: 12/11/2014 Document Reviewed: 04/30/2014 Elsevier Interactive Patient Education  2016 [place] in the Home  Falls can cause injuries. They can happen to people of all ages. There are many things you can do to make your home safe and to help prevent falls.  WHAT CAN I DO ON THE OUTSIDE OF MY HOME?  Regularly fix the edges of walkways and driveways and fix any cracks.  Remove anything that might make you trip as you walk through a door, such as a raised step or threshold.  Trim any bushes or trees on the path to your home.  Use bright outdoor lighting.  Clear any walking paths of anything that might make someone trip, such as rocks or tools.  Regularly check to see if handrails are loose or broken. Make sure that both sides of any steps have handrails.  Any raised decks and porches should have guardrails on the edges.  Have any leaves, snow, or ice cleared regularly.  Use sand or salt on walking paths during winter.  Clean up any spills in your garage right away. This includes oil or  grease spills. WHAT CAN I DO IN THE BATHROOM?   Use night lights.  Install grab bars by the toilet and in the tub and shower. Do not use towel bars as grab bars.  Use non-skid mats or decals in the tub or shower.  If you need to sit down in the shower, use a plastic, non-slip stool.  Keep the floor dry. Clean up any water that spills on the floor as soon as it happens.  Remove soap buildup in the tub or shower regularly.  Attach bath mats securely with double-sided non-slip rug tape.  Do not have throw rugs and other things on the floor that can make you trip. WHAT CAN I DO IN THE BEDROOM?  Use night lights.  Make sure that you have a light by your bed that is easy to reach.  Do not use any sheets or blankets that are too big for your bed. They should not hang down onto the floor.  Have a firm chair that has side arms. You can use this for support while you get dressed.  Do not have throw rugs and other things on the floor that can make you  trip. WHAT CAN I DO IN THE KITCHEN?  Clean up any spills right away.  Avoid walking on wet floors.  Keep items that you use a lot in easy-to-reach places.  If you need to reach something above you, use a strong step stool that has a grab bar.  Keep electrical cords out of the way.  Do not use floor polish or wax that makes floors slippery. If you must use wax, use non-skid floor wax.  Do not have throw rugs and other things on the floor that can make you trip. WHAT CAN I DO WITH MY STAIRS?  Do not leave any items on the stairs.  Make sure that there are handrails on both sides of the stairs and use them. Fix handrails that are broken or loose. Make sure that handrails are as long as the stairways.  Check any carpeting to make sure that it is firmly attached to the stairs. Fix any carpet that is loose or worn.  Avoid having throw rugs at the top or bottom of the stairs. If you do have throw rugs, attach them to the floor with carpet tape.  Make sure that you have a light switch at the top of the stairs and the bottom of the stairs. If you do not have them, ask someone to add them for you. WHAT ELSE CAN I DO TO HELP PREVENT FALLS?  Wear shoes that:  Do not have high heels.  Have rubber bottoms.  Are comfortable and fit you well.  Are closed at the toe. Do not wear sandals.  If you use a stepladder:  Make sure that it is fully opened. Do not climb a closed stepladder.  Make sure that both sides of the stepladder are locked into place.  Ask someone to hold it for you, if possible.  Clearly mark and make sure that you can see:  Any grab bars or handrails.  First and last steps.  Where the edge of each step is.  Use tools that help you move around (mobility aids) if they are needed. These include:  Canes.  Walkers.  Scooters.  Crutches.  Turn on the lights when you go into a dark area. Replace any light bulbs as soon as they burn out.  Set up your  furniture so you have  a clear path. Avoid moving your furniture around.  If any of your floors are uneven, fix them.  If there are any pets around you, be aware of where they are.  Review your medicines with your doctor. Some medicines can make you feel dizzy. This can increase your chance of falling. Ask your doctor what other things that you can do to help prevent falls.   This information is not intended to replace advice given to you by your health care provider. Make sure you discuss any questions you have with your health care provider.   Document Released: 09/16/2009 Document Revised: 04/06/2015 Document Reviewed: 12/25/2014 Elsevier Interactive Patient Education 2016 [place], Female Adopting a healthy lifestyle and getting preventive care can go a long way to promote health and wellness. Talk with your health care provider about what schedule of regular examinations is right for you. This is a good chance for you to check in with your provider about disease prevention and staying healthy. In between checkups, there are plenty of things you can do on your own. Experts have done a lot of research about which lifestyle changes and preventive measures are most likely to keep you healthy. Ask your health care provider for more information. WEIGHT AND DIET  Eat a healthy diet  Be sure to include plenty of vegetables, fruits, low-fat dairy products, and lean protein.  Do not eat a lot of foods high in solid fats, added sugars, or salt.  Get regular exercise. This is one of the most important things you can do for your health.  Most adults should exercise for at least 150 minutes each week. The exercise should increase your heart rate and make you sweat (moderate-intensity exercise).  Most adults should also do strengthening exercises at least twice a week. This is in addition to the moderate-intensity exercise.  Maintain a healthy weight  Body mass index  (BMI) is a measurement that can be used to identify possible weight problems. It estimates body fat based on height and weight. Your health care provider can help determine your BMI and help you achieve or maintain a healthy weight.  For females 7 years of age and older:   A BMI below 18.5 is considered underweight.  A BMI of 18.5 to 24.9 is normal.  A BMI of 25 to 29.9 is considered overweight.  A BMI of 30 and above is considered obese.  Watch levels of cholesterol and blood lipids  You should start having your blood tested for lipids and cholesterol at 80 years of age, then have this test every 5 years.  You may need to have your cholesterol levels checked more often if:  Your lipid or cholesterol levels are high.  You are older than 80 years of age.  You are at high risk for heart disease.  CANCER SCREENING   Lung Cancer  Lung cancer screening is recommended for adults 20-83 years old who are at high risk for lung cancer because of a history of smoking.  A yearly low-dose CT scan of the lungs is recommended for people who:  Currently smoke.  Have quit within the past 15 years.  Have at least a 30-pack-year history of smoking. A pack year is smoking an average of one pack of cigarettes a day for 1 year.  Yearly screening should continue until it has been 15 years since you quit.  Yearly screening should stop if you develop a health problem that would prevent you from  having lung cancer treatment.  Breast Cancer  Practice breast self-awareness. This means understanding how your breasts normally appear and feel.  It also means doing regular breast self-exams. Let your health care provider know about any changes, no matter how small.  If you are in your 20s or 30s, you should have a clinical breast exam (CBE) by a health care provider every 1-3 years as part of a regular health exam.  If you are 53 or older, have a CBE every year. Also consider having a breast  X-ray (mammogram) every year.  If you have a family history of breast cancer, talk to your health care provider about genetic screening.  If you are at high risk for breast cancer, talk to your health care provider about having an MRI and a mammogram every year.  Breast cancer gene (BRCA) assessment is recommended for women who have family members with BRCA-related cancers. BRCA-related cancers include:  Breast.  Ovarian.  Tubal.  Peritoneal cancers.  Results of the assessment will determine the need for genetic counseling and BRCA1 and BRCA2 testing. Cervical Cancer Your health care provider may recommend that you be screened regularly for cancer of the pelvic organs (ovaries, uterus, and vagina). This screening involves a pelvic examination, including checking for microscopic changes to the surface of your cervix (Pap test). You may be encouraged to have this screening done every 3 years, beginning at age 48.  For women ages 65-65, health care providers may recommend pelvic exams and Pap testing every 3 years, or they may recommend the Pap and pelvic exam, combined with testing for human papilloma virus (HPV), every 5 years. Some types of HPV increase your risk of cervical cancer. Testing for HPV may also be done on women of any age with unclear Pap test results.  Other health care providers may not recommend any screening for nonpregnant women who are considered low risk for pelvic cancer and who do not have symptoms. Ask your health care provider if a screening pelvic exam is right for you.  If you have had past treatment for cervical cancer or a condition that could lead to cancer, you need Pap tests and screening for cancer for at least 20 years after your treatment. If Pap tests have been discontinued, your risk factors (such as having a new sexual partner) need to be reassessed to determine if screening should resume. Some women have medical problems that increase the chance of  getting cervical cancer. In these cases, your health care provider may recommend more frequent screening and Pap tests. Colorectal Cancer  This type of cancer can be detected and often prevented.  Routine colorectal cancer screening usually begins at 80 years of age and continues through 80 years of age.  Your health care provider may recommend screening at an earlier age if you have risk factors for colon cancer.  Your health care provider may also recommend using home test kits to check for hidden blood in the stool.  A small camera at the end of a tube can be used to examine your colon directly (sigmoidoscopy or colonoscopy). This is done to check for the earliest forms of colorectal cancer.  Routine screening usually begins at age 13.  Direct examination of the colon should be repeated every 5-10 years through 80 years of age. However, you may need to be screened more often if early forms of precancerous polyps or small growths are found. Skin Cancer  Check your skin from head to toe regularly.  Tell your health care provider about any new moles or changes in moles, especially if there is a change in a mole's shape or color.  Also tell your health care provider if you have a mole that is larger than the size of a pencil eraser.  Always use sunscreen. Apply sunscreen liberally and repeatedly throughout the day.  Protect yourself by wearing long sleeves, pants, a wide-brimmed hat, and sunglasses whenever you are outside. HEART DISEASE, DIABETES, AND HIGH BLOOD PRESSURE   High blood pressure causes heart disease and increases the risk of stroke. High blood pressure is more likely to develop in:  People who have blood pressure in the high end of the normal range (130-139/85-89 mm Hg).  People who are overweight or obese.  People who are African American.  If you are 66-50 years of age, have your blood pressure checked every 3-5 years. If you are 11 years of age or older, have  your blood pressure checked every year. You should have your blood pressure measured twice--once when you are at a hospital or clinic, and once when you are not at a hospital or clinic. Record the average of the two measurements. To check your blood pressure when you are not at a hospital or clinic, you can use:  An automated blood pressure machine at a pharmacy.  A home blood pressure monitor.  If you are between 58 years and 28 years old, ask your health care provider if you should take aspirin to prevent strokes.  Have regular diabetes screenings. This involves taking a blood sample to check your fasting blood sugar level.  If you are at a normal weight and have a low risk for diabetes, have this test once every three years after 80 years of age.  If you are overweight and have a high risk for diabetes, consider being tested at a younger age or more often. PREVENTING INFECTION  Hepatitis B  If you have a higher risk for hepatitis B, you should be screened for this virus. You are considered at high risk for hepatitis B if:  You were born in a country where hepatitis B is common. Ask your health care provider which countries are considered high risk.  Your parents were born in a high-risk country, and you have not been immunized against hepatitis B (hepatitis B vaccine).  You have HIV or AIDS.  You use needles to inject street drugs.  You live with someone who has hepatitis B.  You have had sex with someone who has hepatitis B.  You get hemodialysis treatment.  You take certain medicines for conditions, including cancer, organ transplantation, and autoimmune conditions. Hepatitis C  Blood testing is recommended for:  Everyone born from [birth year] through 1963.  Anyone with known risk factors for hepatitis C. Sexually transmitted infections (STIs)  You should be screened for sexually transmitted infections (STIs) including gonorrhea and chlamydia if:  You are sexually active and  are younger than 80 years of age.  You are older than 80 years of age and your health care provider tells you that you are at risk for this type of infection.  Your sexual activity has changed since you were last screened and you are at an increased risk for chlamydia or gonorrhea. Ask your health care provider if you are at risk.  If you do not have HIV, but are at risk, it may be recommended that you take a prescription medicine daily to prevent HIV infection. This is called pre-exposure  prophylaxis (PrEP). You are considered at risk if:  You are sexually active and do not regularly use condoms or know the HIV status of your partner(s).  You take drugs by injection.  You are sexually active with a partner who has HIV. Talk with your health care provider about whether you are at high risk of being infected with HIV. If you choose to begin PrEP, you should first be tested for HIV. You should then be tested every 3 months for as long as you are taking PrEP.  PREGNANCY   If you are premenopausal and you may become pregnant, ask your health care provider about preconception counseling.  If you may become pregnant, take 400 to 800 micrograms (mcg) of folic acid every day.  If you want to prevent pregnancy, talk to your health care provider about birth control (contraception). OSTEOPOROSIS AND MENOPAUSE   Osteoporosis is a disease in which the bones lose minerals and strength with aging. This can result in serious bone fractures. Your risk for osteoporosis can be identified using a bone density scan.  If you are 55 years of age or older, or if you are at risk for osteoporosis and fractures, ask your health care provider if you should be screened.  Ask your health care provider whether you should take a calcium or vitamin D supplement to lower your risk for osteoporosis.  Menopause may have certain physical symptoms and risks.  Hormone replacement therapy may reduce some of these symptoms  and risks. Talk to your health care provider about whether hormone replacement therapy is right for you.  HOME CARE INSTRUCTIONS   Schedule regular health, dental, and eye exams.  Stay current with your immunizations.   Do not use any tobacco products including cigarettes, chewing tobacco, or electronic cigarettes.  If you are pregnant, do not drink alcohol.  If you are breastfeeding, limit how much and how often you drink alcohol.  Limit alcohol intake to no more than 1 drink per day for nonpregnant women. One drink equals 12 ounces of beer, 5 ounces of wine, or 1 ounces of hard liquor.  Do not use street drugs.  Do not share needles.  Ask your health care provider for help if you need support or information about quitting drugs.  Tell your health care provider if you often feel depressed.  Tell your health care provider if you have ever been abused or do not feel safe at home.   This information is not intended to replace advice given to you by your health care provider. Make sure you discuss any questions you have with your health care provider.   Document Released: 06/05/2011 Document Revised: 12/11/2014 Document Reviewed: 10/22/2013 Elsevier Interactive Patient Education [employer].

## 2016-02-14 NOTE — Progress Notes (Signed)
Subjective:   [name] is a 82 y.o. female who presents for Medicare Annual (Subsequent) preventive examination.  Review of Systems:   HRA assessment completed during visit; Gasque_Dorothy The Patient was informed that this wellness visit is to identify risk and educate on how to reduce risk for increase disease through lifestyle changes.   ROS deferred to CPE exam with physician  Medical and family hx  Mother MI; stroke; CAD Father has Cancer;   6 children  24 grands; and 5 great; youngest 50  Lives with son and has medical alert  Great family support;   Medical issues  Stroke; right side affected; March 6th; 2012 Just has a cane now; does not use one at home.  HTN: BP elevated today; educated to check periodically; would like to see BP <140/80 Hyperlipidemia (cho 214; trig 67; hdl 101; ldl 99; Ratio 2  States Atrial fib; stated was the cause of her stroke;  SVT; to the hospital for SVT; on medication; has ablation   BMI: 23.3 / weight 120/ 121; Diet;  Breakfast; eat toast; muffin; coffee Lunch; soup some days or sandwich;  Supper; eats 3 meals but small meals Sweets; likes cookies;   Exercise; a lot of PT after the stroke;  Raises her legs while in bed;  Walks a lot in the house;  Good friends and goes out to lunch etc;  Balance exercises at church;    SAFETY; Lives with son in one level  Safety reviewed for the home;  Removal of clutter clearing paths through the home,  Takes her own shower; but gets assistance getting in and out;  Bathroom safety; gets up at night and uses BSC and has night light  Alert necklace for emergency Community safety; yes  Smoke detectors yes Firearms safety reviewed Driving accidents and seatbelt/ no; doesn't drive  Sun protection/ maybe in the summer Stressors;  no issues  Was in [place] visiting recently with sister; tolerated well x right ankle slightly more swollen when she got off plane; Better today;   Medication  review/ no issues  Bee sting; has epi pen if needed by takes  Pepcid and Benadryl   Fall assessment; had one fall; states she has fx of neck  ER note states "Occipital condyle fx and tx with soft cervical collar"  No Dexa scan completed at that time. Discussed and the patient declined at present; Was given information on osteoporosis and dexa if she chooses to fup.   Gait assessment has cane if needed for balance when out; does not use a cane at home.   Mobilization and Functional losses in the last year/ Stays fairly active; up and down frequently in home. Cleans the house. Independent in ADLs and does not drive any more. Sleep patterns adequate   Urinary or fecal incontinence reviewed/ no issue   Has lifeline  Counseling: Colonoscopy; 03/2008/ aged out EKG: 10/2015 Mammogram 10/2009/ does not have anymore Dexa declined today; did give her information since she had a fx in May 2016;  Hearing: dtr states she turns the TV up but the patient is not concerned about hearing loss.  Ophthalmology exam; due now and will make apt.  Immunizations / completed at present Health advice or referrals To have eye exam   Current Care Team reviewed and updated Cardiac Risk Factors include: advanced age (>29men, >36 women);family history of premature cardiovascular disease;hypertension;sedentary lifestyle     Objective:     Vitals: BP 164/60 mmHg  Ht 5'  1" (1.549 m)  Wt 124 lb 8 oz (56.473 kg)  BMI 23.54 kg/m2  Tobacco History  Smoking status  . Never Smoker   Smokeless tobacco  . Never Used     Counseling given: Yes   Past Medical History  Diagnosis Date  . HTN (hypertension)   . History of colonic polyps   . Cervical arthritis ([place])   . Hip arthritis   . COLONIC POLYPS, HX OF 09/24/2007  . SUPRAVENTRICULAR ARRHYTHMIA 12/20/2010    s/p RFCA 05/2010-Dr. Lovena Le  . Hyperlipidemia   . Atrial fibrillation ([place])   . History of stroke   . H/O: hysterectomy     fibroid tumors  .  Stroke [facility]) 02/2010   Past Surgical History  Procedure Laterality Date  . Pilonidal cyst excision    . Breast lumpectomy      left  . Fb removal from popliteal fossae    . Intracapsular cataract extraction       Left eye Oct 18, 2009  right eye Jan '11 By Dr Era Skeen  . Colon surgery  03/19/2009    colon cancer   . Transthoracic echocardiogram  2007  . Abdominal hysterectomy     Family History  Problem Relation Age of Onset  . Heart attack Mother   . Stroke Mother   . Coronary artery disease Mother   . Heart disease Mother   . Cancer Father     Colon Cancer  . Diabetes Daughter   . Cancer Daughter     Leukemia   History  Sexual Activity  . Sexual Activity: Not Currently    Outpatient Encounter Prescriptions as of 02/14/2016  Medication Sig  . amiodarone (PACERONE) 200 MG tablet TAKE AS INSTRUCTED BY YOUR PRESCRIBER  . amLODipine (NORVASC) 10 MG tablet Take 1 tablet (10 mg total) by mouth daily.  . folic acid-pyridoxine-cyancobalamin (FOLBIC) 2.5-25-2 MG TABS Take 1 tablet by mouth daily. (Patient taking differently: Take 1 tablet by mouth at bedtime. )  . losartan (COZAAR) 100 MG tablet Take 1 tablet (100 mg total) by mouth daily.  . nitroGLYCERIN (NITROSTAT) 0.4 MG SL tablet Place 0.4 mg under the tongue every 5 (five) minutes as needed for chest pain (MAX 3 TABLETS).   . polyethylene glycol (MIRALAX / GLYCOLAX) packet Take 17 g by mouth daily as needed (constipation).   . Rivaroxaban (XARELTO) 15 MG TABS tablet Take 1 tablet (15 mg total) by mouth daily.   No facility-administered encounter medications on file as of 02/14/2016.    Activities of Daily Living In your present state of health, do you have any difficulty performing the following activities: 02/14/2016  Hearing? Y  Vision? Y  Difficulty concentrating or making decisions? N  Walking or climbing stairs? N  Dressing or bathing? N  Doing errands, shopping? N  Preparing Food and eating ? N  Using the  Toilet? N  In the past six months, have you accidently leaked urine? N  Do you have problems with loss of bowel control? N  Managing your Medications? N  Managing your Finances? N  Housekeeping or managing your Housekeeping? N    Patient Care Team: Hoyt Koch, MD as PCP - General (Internal Medicine)    Assessment:     Exercise Activities and Dietary recommendations Current Exercise Habits: Home exercise routine, Type of exercise: stretching;walking (walking around home ), Intensity: Mild  Goals    . patient     Going to a balance class; keep exercising;  Exercise with stretch bands       Fall Risk Fall Risk  02/14/2016 12/23/2015 10/26/2013 10/24/2013  Falls in the past year? Yes Yes - Yes  Number falls in past yr: 1 1 2  or more 1  Injury with Fall? Yes Yes No No  Risk for fall due to : - - History of fall(s);Impaired balance/gait;Impaired mobility Impaired balance/gait  Follow up Education provided - - -   Did discuss daily exercise with stretch bands;  The patient is going to attend a balance class at church;  Did discuss planning for gardening activities with chair or waiting until someone was around.   Depression Screen PHQ 2/9 Scores 02/14/2016 12/23/2015 10/24/2013  PHQ - 2 Score 0 0 0     Cognitive Testing MMSE - Mini Mental State Exam 02/14/2016  Not completed: (No Data)   Ad8 score 0; sometimes uses an incorrect name for grand child; stated she used the wrong words at times following her stroke; Knew the date; was clear historian; could give accurate information regarding health;    Immunization History  Administered Date(s) Administered  . Influenza Split 09/20/2011, 10/10/2012, 09/23/2013, 10/19/2015  . Influenza Whole 09/16/2008, 09/27/2009, 10/18/2010  . Influenza,inj,Quad PF,36+ Mos 09/17/2014  . Influenza-Unspecified 09/04/2015  . Pneumococcal Conjugate-13 12/18/2013  . Pneumococcal Polysaccharide-23 12/23/2015  . Td 11/09/2009  . Tdap  04/18/2015  . Zoster 02/11/2009   Screening Tests Health Maintenance  Topic Date Due  . DEXA SCAN  04/26/1995  . INFLUENZA VACCINE  07/04/2016  . TETANUS/TDAP  04/17/2025  . ZOSTAVAX  Completed  . PNA vac Low Risk Adult  Completed      Plan:    During the course of the visit the patient was educated and counseled about the following appropriate screening and preventive services:   Vaccines to include Pneumoccal, Influenza, Hepatitis B, Td, Zostavax, HCV  Electrocardiogram /10/2015  Cardiovascular Disease/ hx of; no issues;Still trying to control BP; remains elevated   Colorectal cancer screening aged out  Bone density screening declines today; given information   Diabetes screening/neg  Glaucoma screening/ had eyes checked last year but is due to schedule   Mammography/PAP aged out  Nutrition counseling / diet good; likes cookies; bmi normal  Patient Instructions (the written plan) was given to the patient.   Wynetta Fines, RN  02/14/2016

## 2016-03-02 ENCOUNTER — Other Ambulatory Visit: Payer: Self-pay | Admitting: Internal Medicine

## 2016-03-02 ENCOUNTER — Telehealth: Payer: Self-pay | Admitting: Internal Medicine

## 2016-03-02 NOTE — Telephone Encounter (Signed)
Fine with me, print and I will sign

## 2016-03-02 NOTE — Telephone Encounter (Signed)
Pt's daughter called regarding her amlodipine. If I understood her right she was prescribed this from the ER doctor in November and then [facility] and they were sent out without the patient knowing and then insurance denied. She has been trying to get a refund back on this and they have been doing everything they tell them to get the refund and they keep denying the refund request. The final denial was because the prescriptions were too close together. They told her to call [facility] and of course they couldn't do anything because they don't handle their medication.  They advised her to contact us and have the doctor fax a statement to [facility] stating that this was a doctor's error and out of control of the patient.  She is hoping this would help

## 2016-03-02 NOTE — Telephone Encounter (Signed)
Would you like to type a letter for this issue?

## 2016-05-25 ENCOUNTER — Other Ambulatory Visit: Payer: Self-pay | Admitting: Internal Medicine

## 2016-06-02 ENCOUNTER — Other Ambulatory Visit: Payer: Self-pay | Admitting: Internal Medicine

## 2016-06-26 ENCOUNTER — Telehealth: Payer: Self-pay | Admitting: Emergency Medicine

## 2016-06-26 ENCOUNTER — Other Ambulatory Visit: Payer: Self-pay | Admitting: Geriatric Medicine

## 2016-06-26 MED ORDER — RIVAROXABAN 15 MG PO TABS: 15.0000 mg | ORAL_TABLET | Freq: Every day | ORAL | 3 refills | Status: DC

## 2016-06-26 NOTE — Telephone Encounter (Signed)
Pt called and needs a prescription refill on Rivaroxaban (XARELTO) 15 MG TABS tablet. Pharmacy is [facility]. Please follow up thanks.

## 2016-06-26 NOTE — Telephone Encounter (Signed)
Sent to pharmacy 

## 2016-06-27 NOTE — Telephone Encounter (Signed)
They need a PA on this medication. ([id number]) And sent over again.  Please follow up, Thank you.

## 2016-06-28 NOTE — Telephone Encounter (Signed)
Patient states if nothing else she will try something else that her insurance company will cover without a PA.  Please call patient back at [phone].

## 2016-06-28 NOTE — Telephone Encounter (Signed)
Attempted to initiated PA for this pt via CoverMyMeds and received this error message: PA was already submitted for this patient and drug which was denied.;[id number];Appeal was already submitted for this patient and drug was denied.  Perhaps PA was previously submitted by Cardiology?  Please advise on alternative therapy, thanks!

## 2016-06-28 NOTE — Telephone Encounter (Signed)
Patient calling back in regard.  Told patient it does take a few days to go through.  Patient states she did not know there would be a PA that would have to be processed.  States she only has 5 pills left.  Would like to know if there are samples she could get or if she could get a small script sent to pharmacy.

## 2016-06-29 NOTE — Telephone Encounter (Signed)
She needs to call her insurance company to find out what they will cover.

## 2016-06-29 NOTE — Telephone Encounter (Signed)
Spoke with patient and insurance company. The insurance company is faxing a form that needs the clinical questions answered that were not previously answered and then the xarelto should be approved.

## 2016-06-30 NOTE — Telephone Encounter (Signed)
Amy please follow up on your previous note. Nothing showing that the clinical questionnaire has been received. Please call patient with an update today

## 2016-06-30 NOTE — Telephone Encounter (Signed)
Contacted [facility] at [phone]. Medication APPROVED 05/31/2016 - 06/30/2017 ID [id number]. Pt advised of same

## 2016-10-05 ENCOUNTER — Ambulatory Visit (INDEPENDENT_AMBULATORY_CARE_PROVIDER_SITE_OTHER): Payer: Medicare Other | Admitting: Family Medicine

## 2016-10-05 ENCOUNTER — Encounter: Payer: Self-pay | Admitting: Family Medicine

## 2016-10-05 ENCOUNTER — Telehealth: Payer: Self-pay | Admitting: Internal Medicine

## 2016-10-05 VITALS — BP 128/78 | HR 70 | Temp 98.0°F | Wt 115.6 lb

## 2016-10-05 DIAGNOSIS — J189 Pneumonia, unspecified organism: Secondary | ICD-10-CM

## 2016-10-05 MED ORDER — DOXYCYCLINE HYCLATE 100 MG PO TABS: 100.0000 mg | ORAL_TABLET | Freq: Two times a day (BID) | ORAL | 0 refills | Status: DC

## 2016-10-05 NOTE — Telephone Encounter (Signed)
[facility] [place] Call Center Patient Name: BERINA GIESEKING DOB: 05-22-1928 Initial Comment Mother is coughing with mucus and not having a lot of energy-- breathing is fine. Nurse Assessment Nurse: Markus Daft, RN, Sherre Poot Date/Time (Eastern Time): 10/05/2016 2:21:51 PM Confirm and document reason for call. If symptomatic, describe symptoms. You must click the next button to save text entered. ---Caller states that her mother for last week has had a cold with productive cough, runny nose, and not having a lot of energy. Breathing is fine. Has the patient traveled out of the country within the last 30 days? ---Not Applicable Does the patient have any new or worsening symptoms? ---Yes Will a triage be completed? ---Yes Related visit to physician within the last 2 weeks? ---No Does the PT have any chronic conditions? (i.e. diabetes, asthma, etc.) ---Yes List chronic conditions. ---HTN, blood thinner for CVA Is this a behavioral health or substance abuse call? ---No Guidelines Guideline Title Affirmed Question Affirmed Notes Cough - Acute Productive SEVERE coughing spells (e.g., whooping sound after coughing, vomiting after coughing) Final Disposition User See Physician within [address] [place], [place], [place] Comments Mild chest pain with coughing only. No SOB. No available appts at [place] made with Almira Coaster at [facility] for 3:45 pm. Referrals REFERRED TO PCP OFFICE Disagree/Comply: Leta Baptist

## 2016-10-05 NOTE — Progress Notes (Signed)
Subjective:    Patient ID: [name], female    DOB: 02/20/1928, 82 y.o.   MRN: [id number]  HPI  [name] is an 82 year old female who presents with a cough that has been productive for one week. The cough does not wake her at night. She reports being unable to completely expel sputum so is unable to describe it at this time. Associated nasal congestion, rhinitis, fever and chills per patient report; no Tmax noted. She denies sweats, sore throat, pleuritic chest pain, N/V/D, or ear pain.   Recent sick contact exposure with young grandchildren.  Denies recent antibiotic therapy and history of asthma/bronchitis. Treatment at home with mucinex and Nyquil have provided limited benefit. No aggravating or alleviating factors noted.   Review of Systems  Constitutional: Positive for chills and fever. Negative for fatigue.  HENT: Positive for congestion and rhinorrhea. Negative for postnasal drip, sneezing and sore throat.   Eyes: Negative for visual disturbance.  Respiratory: Positive for cough. Negative for shortness of breath and wheezing.   Cardiovascular: Negative for chest pain and palpitations.  Gastrointestinal: Negative for abdominal pain, nausea and vomiting.  Neurological: Negative for dizziness.   Past Medical History:  Diagnosis Date  . Atrial fibrillation ([place])   . Cervical arthritis ([place])   . COLONIC POLYPS, HX OF 09/24/2007  . H/O: hysterectomy    fibroid tumors  . Hip arthritis   . History of colonic polyps   . History of stroke   . HTN (hypertension)   . Hyperlipidemia   . Stroke ([place]) 02/2010  . SUPRAVENTRICULAR ARRHYTHMIA 12/20/2010   s/p RFCA 05/2010-Dr. Lovena Le     Social History   Social History  . Marital status: Widowed    Spouse name: N/A  . Number of children: 6  . Years of education: N/A   Occupational History  . Retired Retired   Social History Main Topics  . Smoking status: Never Smoker  . Smokeless tobacco: Never Used  . Alcohol use No       Comment: rare  . Drug use: No  . Sexual activity: Not Currently   Other Topics Concern  . Not on file   Social History Narrative   married - 1951- widowed February 12, 2023. 4-daughters, 1 died leukemia; 2 sons; 10 grandchildren; 5 great-grandchildren. I-ADLs. Retired. Lives in her own home but one of her children is with her all of the time. ACP- No CPR, no prolonged mechanical ventilation; not to be sustained long term with artificial feeding or hydration; no chronic HD; no other heroic or futile.           Past Surgical History:  Procedure Laterality Date  . ABDOMINAL HYSTERECTOMY    . BREAST LUMPECTOMY     left  . COLON SURGERY  03/19/2009   colon cancer   . FB removal from popliteal fossae    . INTRACAPSULAR CATARACT EXTRACTION      Left eye Oct 18, 2009  right eye Jan '11 By Dr Era Skeen  . PILONIDAL CYST EXCISION    . TRANSTHORACIC ECHOCARDIOGRAM  2007    Family History  Problem Relation Age of Onset  . Heart attack Mother   . Stroke Mother   . Coronary artery disease Mother   . Heart disease Mother   . Cancer Father     Colon Cancer  . Diabetes Daughter   . Cancer Daughter     Leukemia    Allergies  Allergen Reactions  . Codeine Rash  fine rash    Current Outpatient Prescriptions on File Prior to Visit  Medication Sig Dispense Refill  . amiodarone (PACERONE) 200 MG tablet TAKE AS INSTRUCTED BY YOUR PRESCRIBER (NEED APPOINTMENT FOR FURTHER REFILLS, CALL [phone], FIRST ATTEMPT) 90 tablet 1  . amLODipine (NORVASC) 10 MG tablet Take 1 tablet (10 mg total) by mouth daily. 15 tablet 0  . folic acid-pyridoxine-cyancobalamin (FOLBIC) 2.5-25-2 MG TABS tablet Take 1 tablet by mouth daily. 90 tablet 2  . losartan (COZAAR) 100 MG tablet Take 1 tablet (100 mg total) by mouth daily. 90 tablet 3  . nitroGLYCERIN (NITROSTAT) 0.4 MG SL tablet Place 0.4 mg under the tongue every 5 (five) minutes as needed for chest pain (MAX 3 TABLETS).     . polyethylene glycol (MIRALAX /  GLYCOLAX) packet Take 17 g by mouth daily as needed (constipation).     . Rivaroxaban (XARELTO) 15 MG TABS tablet Take 1 tablet (15 mg total) by mouth daily. 90 tablet 3   No current facility-administered medications on file prior to visit.     BP 128/78 (BP Location: Left Arm, Patient Position: Sitting, Cuff Size: Normal)   Pulse 70   Temp 98 F (36.7 C) (Oral)   Wt 115 lb 9.6 oz (52.4 kg)   SpO2 95%   BMI 21.84 kg/m        Objective:   Physical Exam  Constitutional: She is oriented to person, place, and time.  Thin, adequately nourished female.  Eyes: Pupils are equal, round, and reactive to light. No scleral icterus.  Neck: Neck supple.  Cardiovascular: Normal rate, regular rhythm and intact distal pulses.   Pulmonary/Chest: Effort normal. She has rales.  Rales auscultated on right lower lobe  Abdominal: Soft. Bowel sounds are normal. She exhibits no distension. There is no rebound.  Musculoskeletal: She exhibits no edema.  Lymphadenopathy:    She has no cervical adenopathy.  Neurological: She is alert and oriented to person, place, and time. Coordination normal.  Skin: Skin is warm and dry. No rash noted.  Psychiatric: She has a normal mood and affect. Her behavior is normal. Judgment and thought content normal.       Assessment & Plan:  1. Community acquired pneumonia, unspecified laterality Cough with sputum, history of fever, and crackles noted on exam in right lower lobe present. VSS, RR:18, afebrile today. Suspect symptoms may be due to CAP. Will treat empirically and obtain a Chest X-ray.  Fluoroquinolones or macrolides not chosen due to risk of QT prolongation and current use of amiodarone. Recommended to patient and her daughter that antibiotic therapy be initiated and chest X-ray ordered.  Advised her to seek immediate medical attention if she has SOB, chest pain, has a fever >100, or new symptoms occur. Recommended close monitoring and follow up on Monday with her  PCP or another provider if she is unable to schedule a visit with her PCP. Patient and daughter voiced understanding and agreed with plan. - DG Chest 2 View; Future - doxycycline (VIBRA-TABS) 100 MG tablet; Take 1 tablet (100 mg total) by mouth 2 (two) times daily.  Dispense: 20 tablet; Refill: 0  Delano Metz, FNP-C

## 2016-10-05 NOTE — Telephone Encounter (Signed)
Agree with visit.

## 2016-10-05 NOTE — Progress Notes (Signed)
Pre visit review using our clinic review tool, if applicable. No additional management support is needed unless otherwise documented below in the visit note. 

## 2016-10-05 NOTE — Patient Instructions (Addendum)
Please go to [place] at [facility] for your x-ray and complete your medication as directed. Advise follow up on Monday of next week or sooner if symptoms do not improve with treatment, worse, or you develop a fever >100. Seek care immediately if you develop shortness of breath or chest pain

## 2016-10-06 ENCOUNTER — Ambulatory Visit (INDEPENDENT_AMBULATORY_CARE_PROVIDER_SITE_OTHER)
Admission: RE | Admit: 2016-10-06 | Discharge: 2016-10-06 | Disposition: A | Discharge status: Home / Self Care | Payer: Medicare Other | Source: Ambulatory Visit | Attending: Family Medicine | Admitting: Family Medicine

## 2016-10-06 ENCOUNTER — Telehealth: Payer: Self-pay | Admitting: Internal Medicine

## 2016-10-06 ENCOUNTER — Other Ambulatory Visit: Payer: Self-pay | Admitting: *Deleted

## 2016-10-06 ENCOUNTER — Telehealth: Payer: Self-pay | Admitting: Emergency Medicine

## 2016-10-06 DIAGNOSIS — J189 Pneumonia, unspecified organism: Secondary | ICD-10-CM

## 2016-10-06 DIAGNOSIS — R0602 Shortness of breath: Secondary | ICD-10-CM | POA: Diagnosis not present

## 2016-10-06 DIAGNOSIS — R05 Cough: Secondary | ICD-10-CM | POA: Diagnosis not present

## 2016-10-06 MED ORDER — DOXYCYCLINE HYCLATE 100 MG PO TABS: 100.0000 mg | ORAL_TABLET | Freq: Two times a day (BID) | ORAL | 0 refills | Status: DC

## 2016-10-06 NOTE — Telephone Encounter (Signed)
Pts daughter called and asked that you call her back ASAP with pts chest xray results. Thanks.

## 2016-10-06 NOTE — Telephone Encounter (Signed)
Dr. Sharlet Salina did not order. Patient has been informed from the ordering provider's office.

## 2016-10-06 NOTE — Telephone Encounter (Signed)
Rx resent to correct pharmacy

## 2016-10-06 NOTE — Telephone Encounter (Signed)
Daughter wants to know if we can cancel the [facility] so pt does not get charged?

## 2016-10-06 NOTE — Telephone Encounter (Addendum)
Pt seen yesterday and her abx doxycycline (VIBRA-TABS) 100 MG tablet  [facility].  Please resend to  [place] [facility] and [facility]

## 2016-10-06 NOTE — Telephone Encounter (Signed)
Daughter Rodena Piety would like a call back with the results of the chest xray as soon as possible. They just left xray and ask them to put a rush on it. Pt feels worse today.

## 2016-10-12 ENCOUNTER — Encounter: Payer: Self-pay | Admitting: Family Medicine

## 2016-10-12 ENCOUNTER — Ambulatory Visit (INDEPENDENT_AMBULATORY_CARE_PROVIDER_SITE_OTHER): Payer: Medicare Other | Admitting: Family Medicine

## 2016-10-12 VITALS — BP 138/72 | HR 71 | Temp 97.3°F | Wt 119.0 lb

## 2016-10-12 DIAGNOSIS — Z23 Encounter for immunization: Secondary | ICD-10-CM | POA: Diagnosis not present

## 2016-10-12 DIAGNOSIS — J069 Acute upper respiratory infection, unspecified: Secondary | ICD-10-CM | POA: Diagnosis not present

## 2016-10-12 NOTE — Progress Notes (Signed)
Subjective:    Patient ID: [name], female    DOB: Dec 16, 1927, 82 y.o.   MRN: [id number]  HPI  [name] is an 82 year old female who presents today with a cough that has improved. Cough is nonproductive, has improved and occurs only at night but does not interrupt her sleep. She reports that she does not cough every night.  Associated rhinitis with clear drainage is present but this is noted as a chronic issue.  She denies fever, chills, sweats, sore throat, pleuritic chest pain, SOB, wheezing, N/V/D, or ear pain. She is completing her doxycycline that was prescribed one week ago. She reports excellent benefit from treatment and feels well today.  Treatment at home includes doxycycline; no OTC medication has been used or needed.   Review of Systems  Constitutional: Negative for chills, fatigue and fever.  HENT: Positive for rhinorrhea. Negative for congestion, postnasal drip, sinus pain, sinus pressure, sneezing and sore throat.   Eyes: Negative for visual disturbance.  Respiratory: Positive for cough. Negative for shortness of breath and wheezing.   Cardiovascular: Negative for chest pain and palpitations.  Gastrointestinal: Negative for abdominal pain, constipation, diarrhea, nausea and vomiting.  Genitourinary: Negative for hematuria and urgency.  Musculoskeletal: Negative for back pain and myalgias.  Neurological: Negative for dizziness, weakness, light-headedness, numbness and headaches.   Past Medical History:  Diagnosis Date  . Atrial fibrillation ([place])   . Cervical arthritis ([place])   . COLONIC POLYPS, HX OF 09/24/2007  . H/O: hysterectomy    fibroid tumors  . Hip arthritis   . History of colonic polyps   . History of stroke   . HTN (hypertension)   . Hyperlipidemia   . Stroke ([place]) 02/2010  . SUPRAVENTRICULAR ARRHYTHMIA 12/20/2010   s/p RFCA 05/2010-Dr. Lovena Le     Social History   Social History  . Marital status: Widowed    Spouse name: N/A  . Number of  children: 6  . Years of education: N/A   Occupational History  . Retired Retired   Social History Main Topics  . Smoking status: Never Smoker  . Smokeless tobacco: Never Used  . Alcohol use No     Comment: rare  . Drug use: No  . Sexual activity: Not Currently   Other Topics Concern  . Not on file   Social History Narrative   married - 1951- widowed 2023-02-27. 4-daughters, 1 died leukemia; 2 sons; 10 grandchildren; 5 great-grandchildren. I-ADLs. Retired. Lives in her own home but one of her children is with her all of the time. ACP- No CPR, no prolonged mechanical ventilation; not to be sustained long term with artificial feeding or hydration; no chronic HD; no other heroic or futile.           Past Surgical History:  Procedure Laterality Date  . ABDOMINAL HYSTERECTOMY    . BREAST LUMPECTOMY     left  . COLON SURGERY  03/19/2009   colon cancer   . FB removal from popliteal fossae    . INTRACAPSULAR CATARACT EXTRACTION      Left eye Oct 18, 2009  right eye Jan '11 By Dr Era Skeen  . PILONIDAL CYST EXCISION    . TRANSTHORACIC ECHOCARDIOGRAM  2007    Family History  Problem Relation Age of Onset  . Heart attack Mother   . Stroke Mother   . Coronary artery disease Mother   . Heart disease Mother   . Cancer Father  Colon Cancer  . Diabetes Daughter   . Cancer Daughter     Leukemia    Allergies  Allergen Reactions  . Codeine Rash    fine rash    Current Outpatient Prescriptions on File Prior to Visit  Medication Sig Dispense Refill  . amiodarone (PACERONE) 200 MG tablet TAKE AS INSTRUCTED BY YOUR PRESCRIBER (NEED APPOINTMENT FOR FURTHER REFILLS, CALL [phone], FIRST ATTEMPT) 90 tablet 1  . amLODipine (NORVASC) 10 MG tablet Take 1 tablet (10 mg total) by mouth daily. 15 tablet 0  . doxycycline (VIBRA-TABS) 100 MG tablet Take 1 tablet (100 mg total) by mouth 2 (two) times daily. 20 tablet 0  . folic acid-pyridoxine-cyancobalamin (FOLBIC) 2.5-25-2 MG TABS tablet  Take 1 tablet by mouth daily. 90 tablet 2  . losartan (COZAAR) 100 MG tablet Take 1 tablet (100 mg total) by mouth daily. 90 tablet 3  . nitroGLYCERIN (NITROSTAT) 0.4 MG SL tablet Place 0.4 mg under the tongue every 5 (five) minutes as needed for chest pain (MAX 3 TABLETS).     . polyethylene glycol (MIRALAX / GLYCOLAX) packet Take 17 g by mouth daily as needed (constipation).     . Rivaroxaban (XARELTO) 15 MG TABS tablet Take 1 tablet (15 mg total) by mouth daily. 90 tablet 3   No current facility-administered medications on file prior to visit.     BP 138/72 (BP Location: Left Arm, Patient Position: Sitting, Cuff Size: Normal)   Pulse 71   Temp 97.3 F (36.3 C) (Oral)   Wt 119 lb (54 kg)   SpO2 98%   BMI 22.48 kg/m       Objective:   Physical Exam  Constitutional: She is oriented to person, place, and time. She appears well-developed and well-nourished.  HENT:  Right Ear: Tympanic membrane normal.  Left Ear: Tympanic membrane normal.  Nose: Rhinorrhea present. Right sinus exhibits no maxillary sinus tenderness and no frontal sinus tenderness. Left sinus exhibits no maxillary sinus tenderness and no frontal sinus tenderness.  Mouth/Throat: Mucous membranes are normal. No oropharyngeal exudate or posterior oropharyngeal erythema.  Eyes: Pupils are equal, round, and reactive to light. No scleral icterus.  Neck: Neck supple.  Cardiovascular: Normal rate and regular rhythm.   Pulmonary/Chest: Effort normal and breath sounds normal. She has no wheezes. She has no rales.  Abdominal: Soft. Bowel sounds are normal.  Lymphadenopathy:    She has no cervical adenopathy.  Neurological: She is alert and oriented to person, place, and time. Coordination normal.  Skin: Skin is warm and dry. No rash noted.  Psychiatric: She has a normal mood and affect. Her behavior is normal. Judgment and thought content normal.       Assessment & Plan:  1. Upper respiratory infection with cough and  congestion Resolving symptoms with lungs CTA. Chest X-ray on 10/06/2016 indicated mild chronic bronchitic changes without evidence of pulmonary edema or pneumonia noted. Suspect that doxycycline treatment provided benefit.  Advised patient to complete last 2 days of treatment and follow up for further evaluation with her PCP if she experiences any worsening or new symptoms.  Patient and daughter voiced understanding and agreed with plan.  2. Encounter for immunization VSS; afebrile; resolving upper respiratory symptoms; patient feels well today. Will complete influenza vaccine today.  - Flu vaccine HIGH DOSE PF  Delano Metz, FNP-C

## 2016-10-12 NOTE — Patient Instructions (Addendum)
It was a pleasure to see you today! Please last few tablets of your antibiotic and follow up as needed. If you do not continue to improve, worsen, or develop a fever, please follow up with your physician.   Upper Respiratory Infection, Adult Most upper respiratory infections (URIs) are a viral infection of the air passages leading to the lungs. A URI affects the nose, throat, and upper air passages. The most common type of URI is nasopharyngitis and is typically referred to as "the common cold." URIs run their course and usually go away on their own. Most of the time, a URI does not require medical attention, but sometimes a bacterial infection in the upper airways can follow a viral infection. This is called a secondary infection. Sinus and middle ear infections are common types of secondary upper respiratory infections. Bacterial pneumonia can also complicate a URI. A URI can worsen asthma and chronic obstructive pulmonary disease (COPD). Sometimes, these complications can require emergency medical care and may be life threatening.  CAUSES Almost all URIs are caused by viruses. A virus is a type of germ and can spread from one person to another.  RISKS FACTORS You may be at risk for a URI if:   You smoke.   You have chronic heart or lung disease.  You have a weakened defense (immune) system.   You are very young or very old.   You have nasal allergies or asthma.  You work in crowded or poorly ventilated areas.  You work in health care facilities or schools. SIGNS AND SYMPTOMS  Symptoms typically develop 2-3 days after you come in contact with a cold virus. Most viral URIs last 7-10 days. However, viral URIs from the influenza virus (flu virus) can last 14-18 days and are typically more severe. Symptoms may include:   Runny or stuffy (congested) nose.   Sneezing.   Cough.   Sore throat.   Headache.   Fatigue.   Fever.   Loss of appetite.   Pain in your  forehead, behind your eyes, and over your cheekbones (sinus pain).  Muscle aches.  DIAGNOSIS  Your health care provider may diagnose a URI by:  Physical exam.  Tests to check that your symptoms are not due to another condition such as:  Strep throat.  Sinusitis.  Pneumonia.  Asthma. TREATMENT  A URI goes away on its own with time. It cannot be cured with medicines, but medicines may be prescribed or recommended to relieve symptoms. Medicines may help:  Reduce your fever.  Reduce your cough.  Relieve nasal congestion. HOME CARE INSTRUCTIONS   Take medicines only as directed by your health care provider.   Gargle warm saltwater or take cough drops to comfort your throat as directed by your health care provider.  Use a warm mist humidifier or inhale steam from a shower to increase air moisture. This may make it easier to breathe.  Drink enough fluid to keep your urine clear or pale yellow.   Eat soups and other clear broths and maintain good nutrition.   Rest as needed.   Return to work when your temperature has returned to normal or as your health care provider advises. You may need to stay home longer to avoid infecting others. You can also use a face mask and careful hand washing to prevent spread of the virus.  Increase the usage of your inhaler if you have asthma.   Do not use any tobacco products, including cigarettes, chewing tobacco, or  electronic cigarettes. If you need help quitting, ask your health care provider. PREVENTION  The best way to protect yourself from getting a cold is to practice good hygiene.   Avoid oral or hand contact with people with cold symptoms.   Wash your hands often if contact occurs.  There is no clear evidence that vitamin C, vitamin E, echinacea, or exercise reduces the chance of developing a cold. However, it is always recommended to get plenty of rest, exercise, and practice good nutrition.  SEEK MEDICAL CARE IF:   You  are getting worse rather than better.   Your symptoms are not controlled by medicine.   You have chills.  You have worsening shortness of breath.  You have brown or red mucus.  You have yellow or brown nasal discharge.  You have pain in your face, especially when you bend forward.  You have a fever.  You have swollen neck glands.  You have pain while swallowing.  You have white areas in the back of your throat. SEEK IMMEDIATE MEDICAL CARE IF:   You have severe or persistent:  Headache.  Ear pain.  Sinus pain.  Chest pain.  You have chronic lung disease and any of the following:  Wheezing.  Prolonged cough.  Coughing up blood.  A change in your usual mucus.  You have a stiff neck.  You have changes in your:  Vision.  Hearing.  Thinking.  Mood. MAKE SURE YOU:   Understand these instructions.  Will watch your condition.  Will get help right away if you are not doing well or get worse.   This information is not intended to replace advice given to you by your health care provider. Make sure you discuss any questions you have with your health care provider.   Document Released: 05/16/2001 Document Revised: 04/06/2015 Document Reviewed: 02/25/2014 Elsevier Interactive Patient Education [employer].

## 2016-10-12 NOTE — Progress Notes (Signed)
Pre visit review using our clinic review tool, if applicable. No additional management support is needed unless otherwise documented below in the visit note. 

## 2016-12-08 ENCOUNTER — Telehealth: Payer: Self-pay | Admitting: *Deleted

## 2016-12-08 MED ORDER — LOSARTAN POTASSIUM 100 MG PO TABS: 100.0000 mg | ORAL_TABLET | Freq: Every day | ORAL | 0 refills | Status: DC

## 2016-12-08 NOTE — Telephone Encounter (Signed)
Daughter left msg on triage stating mom insurance has change to [facility], and she is needing all of her prescriptions sent to [facility]...[name]

## 2016-12-08 NOTE — Telephone Encounter (Signed)
Called daughter back inform her when pt come in for her CPX on 1/22 they will send 90 day in to her mail service. Daughter states she is needing the losartan now. Inform her I can send a 30 day to her local pharmacy. Sent to [facility]...[name]

## 2016-12-25 ENCOUNTER — Encounter: Payer: Self-pay | Admitting: Internal Medicine

## 2016-12-25 ENCOUNTER — Ambulatory Visit (INDEPENDENT_AMBULATORY_CARE_PROVIDER_SITE_OTHER): Payer: Medicare PPO | Admitting: Internal Medicine

## 2016-12-25 ENCOUNTER — Other Ambulatory Visit (INDEPENDENT_AMBULATORY_CARE_PROVIDER_SITE_OTHER): Payer: Medicare PPO

## 2016-12-25 VITALS — BP 150/58 | HR 73 | Temp 97.6°F | Resp 12 | Ht 61.0 in | Wt 119.0 lb

## 2016-12-25 DIAGNOSIS — I480 Paroxysmal atrial fibrillation: Secondary | ICD-10-CM | POA: Diagnosis not present

## 2016-12-25 DIAGNOSIS — I10 Essential (primary) hypertension: Secondary | ICD-10-CM | POA: Diagnosis not present

## 2016-12-25 DIAGNOSIS — Z0000 Encounter for general adult medical examination without abnormal findings: Secondary | ICD-10-CM | POA: Diagnosis not present

## 2016-12-25 DIAGNOSIS — E785 Hyperlipidemia, unspecified: Secondary | ICD-10-CM | POA: Diagnosis not present

## 2016-12-25 LAB — COMPREHENSIVE METABOLIC PANEL
ALT: 18 U/L (ref 0–35)
AST: 20 U/L (ref 0–37)
Albumin: 4.4 g/dL (ref 3.5–5.2)
Alkaline Phosphatase: 57 U/L (ref 39–117)
BUN: 19 mg/dL (ref 6–23)
CO2: 25 mEq/L (ref 19–32)
Calcium: 10.1 mg/dL (ref 8.4–10.5)
Chloride: 104 mEq/L (ref 96–112)
Creatinine, Ser: 1.2 mg/dL (ref 0.40–1.20)
GFR: 45.2 mL/min — ABNORMAL LOW (ref >60.00)
Glucose, Bld: 105 mg/dL — ABNORMAL HIGH (ref 70–99)
Potassium: 4.6 mEq/L (ref 3.5–5.1)
Sodium: 137 mEq/L (ref 135–145)
Total Bilirubin: 0.5 mg/dL (ref 0.2–1.2)
Total Protein: 7.5 g/dL (ref 6.0–8.3)

## 2016-12-25 LAB — LIPID PANEL
Cholesterol: 216 mg/dL — ABNORMAL HIGH (ref 0–200)
HDL: 107.7 mg/dL (ref >39.00)
LDL Cholesterol: 96 mg/dL (ref 0–99)
NonHDL: 108.4
Total CHOL/HDL Ratio: 2
Triglycerides: 63 mg/dL (ref 0.0–149.0)
VLDL: 12.6 mg/dL (ref 0.0–40.0)

## 2016-12-25 LAB — CBC
HCT: 39.2 % (ref 36.0–46.0)
Hemoglobin: 13 g/dL (ref 12.0–15.0)
MCHC: 33.3 g/dL (ref 30.0–36.0)
MCV: 83.8 fl (ref 78.0–100.0)
Platelets: 184 10*3/uL (ref 150.0–400.0)
RBC: 4.67 Mil/uL (ref 3.87–5.11)
RDW: 16.1 % — ABNORMAL HIGH (ref 11.5–15.5)
WBC: 8.2 10*3/uL (ref 4.0–10.5)

## 2016-12-25 LAB — T4, FREE: Free T4: 1.41 ng/dL (ref 0.60–1.60)

## 2016-12-25 LAB — TSH: TSH: 1.68 u[IU]/mL (ref 0.35–4.50)

## 2016-12-25 NOTE — Progress Notes (Signed)
   Subjective:    Patient ID: [name], female    DOB: 06/14/1928, 83 y.o.   MRN: [id number]  HPI The patient is an 83 YO female coming in for wellness. No new concerns.   PMH, [facility], social history reviewed and updated.   Review of Systems  Constitutional: Negative.   HENT: Negative.   Eyes: Negative.   Respiratory: Negative for cough, chest tightness and shortness of breath.   Cardiovascular: Negative for chest pain, palpitations and leg swelling.  Gastrointestinal: Negative for abdominal distention, abdominal pain, constipation, diarrhea, nausea and vomiting.  Musculoskeletal: Positive for arthralgias.  Skin: Negative.   Neurological: Negative.   Psychiatric/Behavioral: Negative.       Objective:   Physical Exam  Constitutional: She is oriented to person, place, and time. She appears well-developed and well-nourished.  HENT:  Head: Normocephalic and atraumatic.  Eyes: EOM are normal.  Neck: Normal range of motion.  Cardiovascular: Normal rate and regular rhythm.   Pulmonary/Chest: Effort normal and breath sounds normal. No respiratory distress. She has no wheezes. She has no rales.  Abdominal: Soft. Bowel sounds are normal. She exhibits no distension. There is no tenderness. There is no rebound.  Musculoskeletal: She exhibits no edema.  Neurological: She is alert and oriented to person, place, and time. Coordination normal.  Skin: Skin is warm and dry.  Psychiatric: She has a normal mood and affect.   Vitals:   12/25/16 1359  BP: (!) 150/58  Pulse: 73  Resp: 12  Temp: 97.6 F (36.4 C)  TempSrc: Oral  SpO2: 99%  Weight: 119 lb (54 kg)  Height: 5\' 1"  (1.549 m)      Assessment & Plan:

## 2016-12-25 NOTE — Progress Notes (Signed)
Pre visit review using our clinic review tool, if applicable. No additional management support is needed unless otherwise documented below in the visit note. 

## 2016-12-25 NOTE — Patient Instructions (Signed)
We will check the labs today and send in the refills.   We have cleaned out the left ear today.   Health Maintenance, Female Introduction Adopting a healthy lifestyle and getting preventive care can go a long way to promote health and wellness. Talk with your health care provider about what schedule of regular examinations is right for you. This is a good chance for you to check in with your provider about disease prevention and staying healthy. In between checkups, there are plenty of things you can do on your own. Experts have done a lot of research about which lifestyle changes and preventive measures are most likely to keep you healthy. Ask your health care provider for more information. Weight and diet Eat a healthy diet  Be sure to include plenty of vegetables, fruits, low-fat dairy products, and lean protein.  Do not eat a lot of foods high in solid fats, added sugars, or salt.  Get regular exercise. This is one of the most important things you can do for your health.  Most adults should exercise for at least 150 minutes each week. The exercise should increase your heart rate and make you sweat (moderate-intensity exercise).  Most adults should also do strengthening exercises at least twice a week. This is in addition to the moderate-intensity exercise. Maintain a healthy weight  Body mass index (BMI) is a measurement that can be used to identify possible weight problems. It estimates body fat based on height and weight. Your health care provider can help determine your BMI and help you achieve or maintain a healthy weight.  For females 64 years of age and older:  A BMI below 18.5 is considered underweight.  A BMI of 18.5 to 24.9 is normal.  A BMI of 25 to 29.9 is considered overweight.  A BMI of 30 and above is considered obese. Watch levels of cholesterol and blood lipids  You should start having your blood tested for lipids and cholesterol at 81 years of age, then have  this test every 5 years.  You may need to have your cholesterol levels checked more often if:  Your lipid or cholesterol levels are high.  You are older than 81 years of age.  You are at high risk for heart disease. Cancer screening Lung Cancer  Lung cancer screening is recommended for adults 73-56 years old who are at high risk for lung cancer because of a history of smoking.  A yearly low-dose CT scan of the lungs is recommended for people who:  Currently smoke.  Have quit within the past 15 years.  Have at least a 30-pack-year history of smoking. A pack year is smoking an average of one pack of cigarettes a day for 1 year.  Yearly screening should continue until it has been 15 years since you quit.  Yearly screening should stop if you develop a health problem that would prevent you from having lung cancer treatment. Breast Cancer  Practice breast self-awareness. This means understanding how your breasts normally appear and feel.  It also means doing regular breast self-exams. Let your health care provider know about any changes, no matter how small.  If you are in your 20s or 30s, you should have a clinical breast exam (CBE) by a health care provider every 1-3 years as part of a regular health exam.  If you are 46 or older, have a CBE every year. Also consider having a breast X-ray (mammogram) every year.  If you have a family  history of breast cancer, talk to your health care provider about genetic screening.  If you are at high risk for breast cancer, talk to your health care provider about having an MRI and a mammogram every year.  Breast cancer gene (BRCA) assessment is recommended for women who have family members with BRCA-related cancers. BRCA-related cancers include:  Breast.  Ovarian.  Tubal.  Peritoneal cancers.  Results of the assessment will determine the need for genetic counseling and BRCA1 and BRCA2 testing. Cervical Cancer  Your health care  provider may recommend that you be screened regularly for cancer of the pelvic organs (ovaries, uterus, and vagina). This screening involves a pelvic examination, including checking for microscopic changes to the surface of your cervix (Pap test). You may be encouraged to have this screening done every 3 years, beginning at age 62.  For women ages 35-65, health care providers may recommend pelvic exams and Pap testing every 3 years, or they may recommend the Pap and pelvic exam, combined with testing for human papilloma virus (HPV), every 5 years. Some types of HPV increase your risk of cervical cancer. Testing for HPV may also be done on women of any age with unclear Pap test results.  Other health care providers may not recommend any screening for nonpregnant women who are considered low risk for pelvic cancer and who do not have symptoms. Ask your health care provider if a screening pelvic exam is right for you.  If you have had past treatment for cervical cancer or a condition that could lead to cancer, you need Pap tests and screening for cancer for at least 20 years after your treatment. If Pap tests have been discontinued, your risk factors (such as having a new sexual partner) need to be reassessed to determine if screening should resume. Some women have medical problems that increase the chance of getting cervical cancer. In these cases, your health care provider may recommend more frequent screening and Pap tests. Colorectal Cancer  This type of cancer can be detected and often prevented.  Routine colorectal cancer screening usually begins at 81 years of age and continues through 81 years of age.  Your health care provider may recommend screening at an earlier age if you have risk factors for colon cancer.  Your health care provider may also recommend using home test kits to check for hidden blood in the stool.  A small camera at the end of a tube can be used to examine your colon directly  (sigmoidoscopy or colonoscopy). This is done to check for the earliest forms of colorectal cancer.  Routine screening usually begins at age 40.  Direct examination of the colon should be repeated every 5-10 years through 81 years of age. However, you may need to be screened more often if early forms of precancerous polyps or small growths are found. Skin Cancer  Check your skin from head to toe regularly.  Tell your health care provider about any new moles or changes in moles, especially if there is a change in a mole's shape or color.  Also tell your health care provider if you have a mole that is larger than the size of a pencil eraser.  Always use sunscreen. Apply sunscreen liberally and repeatedly throughout the day.  Protect yourself by wearing long sleeves, pants, a wide-brimmed hat, and sunglasses whenever you are outside. Heart disease, diabetes, and high blood pressure  High blood pressure causes heart disease and increases the risk of stroke. High blood pressure  is more likely to develop in:  People who have blood pressure in the high end of the normal range (130-139/85-89 mm Hg).  People who are overweight or obese.  People who are African American.  If you are 19-4 years of age, have your blood pressure checked every 3-5 years. If you are 76 years of age or older, have your blood pressure checked every year. You should have your blood pressure measured twice-once when you are at a hospital or clinic, and once when you are not at a hospital or clinic. Record the average of the two measurements. To check your blood pressure when you are not at a hospital or clinic, you can use:  An automated blood pressure machine at a pharmacy.  A home blood pressure monitor.  If you are between 63 years and 89 years old, ask your health care provider if you should take aspirin to prevent strokes.  Have regular diabetes screenings. This involves taking a blood sample to check your  fasting blood sugar level.  If you are at a normal weight and have a low risk for diabetes, have this test once every three years after 81 years of age.  If you are overweight and have a high risk for diabetes, consider being tested at a younger age or more often. Preventing infection Hepatitis B  If you have a higher risk for hepatitis B, you should be screened for this virus. You are considered at high risk for hepatitis B if:  You were born in a country where hepatitis B is common. Ask your health care provider which countries are considered high risk.  Your parents were born in a high-risk country, and you have not been immunized against hepatitis B (hepatitis B vaccine).  You have HIV or AIDS.  You use needles to inject street drugs.  You live with someone who has hepatitis B.  You have had sex with someone who has hepatitis B.  You get hemodialysis treatment.  You take certain medicines for conditions, including cancer, organ transplantation, and autoimmune conditions. Hepatitis C  Blood testing is recommended for:  Everyone born from [birth year] through 1963.  Anyone with known risk factors for hepatitis C. Sexually transmitted infections (STIs)  You should be screened for sexually transmitted infections (STIs) including gonorrhea and chlamydia if:  You are sexually active and are younger than 81 years of age.  You are older than 81 years of age and your health care provider tells you that you are at risk for this type of infection.  Your sexual activity has changed since you were last screened and you are at an increased risk for chlamydia or gonorrhea. Ask your health care provider if you are at risk.  If you do not have HIV, but are at risk, it may be recommended that you take a prescription medicine daily to prevent HIV infection. This is called pre-exposure prophylaxis (PrEP). You are considered at risk if:  You are sexually active and do not regularly use condoms or  know the HIV status of your partner(s).  You take drugs by injection.  You are sexually active with a partner who has HIV. Talk with your health care provider about whether you are at high risk of being infected with HIV. If you choose to begin PrEP, you should first be tested for HIV. You should then be tested every 3 months for as long as you are taking PrEP. Pregnancy  If you are premenopausal and you may become  pregnant, ask your health care provider about preconception counseling.  If you may become pregnant, take 400 to 800 micrograms (mcg) of folic acid every day.  If you want to prevent pregnancy, talk to your health care provider about birth control (contraception). Osteoporosis and menopause  Osteoporosis is a disease in which the bones lose minerals and strength with aging. This can result in serious bone fractures. Your risk for osteoporosis can be identified using a bone density scan.  If you are 72 years of age or older, or if you are at risk for osteoporosis and fractures, ask your health care provider if you should be screened.  Ask your health care provider whether you should take a calcium or vitamin D supplement to lower your risk for osteoporosis.  Menopause may have certain physical symptoms and risks.  Hormone replacement therapy may reduce some of these symptoms and risks. Talk to your health care provider about whether hormone replacement therapy is right for you. Follow these instructions at home:  Schedule regular health, dental, and eye exams.  Stay current with your immunizations.  Do not use any tobacco products including cigarettes, chewing tobacco, or electronic cigarettes.  If you are pregnant, do not drink alcohol.  If you are breastfeeding, limit how much and how often you drink alcohol.  Limit alcohol intake to no more than 1 drink per day for nonpregnant women. One drink equals 12 ounces of beer, 5 ounces of wine, or 1 ounces of hard  liquor.  Do not use street drugs.  Do not share needles.  Ask your health care provider for help if you need support or information about quitting drugs.  Tell your health care provider if you often feel depressed.  Tell your health care provider if you have ever been abused or do not feel safe at home. This information is not intended to replace advice given to you by your health care provider. Make sure you discuss any questions you have with your health care provider. Document Released: 06/05/2011 Document Revised: 04/27/2016 Document Reviewed: 08/24/2015  2017 [employer]

## 2016-12-27 ENCOUNTER — Other Ambulatory Visit: Payer: Self-pay | Admitting: *Deleted

## 2016-12-27 MED ORDER — LOSARTAN POTASSIUM 100 MG PO TABS: 100.0000 mg | ORAL_TABLET | Freq: Every day | ORAL | 3 refills | Status: DC

## 2016-12-27 MED ORDER — AMIODARONE HCL 200 MG PO TABS: ORAL_TABLET | ORAL | 3 refills | Status: DC

## 2016-12-27 MED ORDER — AMLODIPINE BESYLATE 10 MG PO TABS: 10.0000 mg | ORAL_TABLET | Freq: Every day | ORAL | 3 refills | Status: DC

## 2016-12-27 MED ORDER — RIVAROXABAN 15 MG PO TABS: 15.0000 mg | ORAL_TABLET | Freq: Every day | ORAL | 3 refills | Status: DC

## 2016-12-27 NOTE — Telephone Encounter (Signed)
Rec'd call from pt daughter [name]) stating [employer] states thye have not received prescriptions that was supposed to been sent in Monday at mom annual. Verified chart inform daughter refills was not sent will send electronically to [facility] as we speak...[name]

## 2016-12-28 NOTE — Assessment & Plan Note (Addendum)
Not on statin. Checking lipid panel for goal LDL <100 or <70 if able. Adjust as needed.

## 2016-12-28 NOTE — Assessment & Plan Note (Signed)
Checking TSH and free T4 for long term amiodarone therapy. She is in sinus on exam today. Taking xarelto for anticoagulation.

## 2016-12-28 NOTE — Assessment & Plan Note (Signed)
Flu and tetanus and pneumonia and shingles shot are up to date. She does not want another bone density as she had one long ago. Counseled about home safety and sun safety with mole surveillance. Given 10 year screening recommendations.

## 2016-12-28 NOTE — Assessment & Plan Note (Signed)
BP at goal on her amlodipine and amiodarone and losartan. Checking CMP and adjust as needed.

## 2016-12-29 ENCOUNTER — Telehealth: Payer: Self-pay | Admitting: *Deleted

## 2016-12-29 MED ORDER — FA-PYRIDOXINE-CYANOCOBALAMIN 2.5-25-2 MG PO TABS: 1.0000 | ORAL_TABLET | Freq: Every day | ORAL | 3 refills | Status: DC

## 2016-12-29 NOTE — Telephone Encounter (Signed)
Daughter left msg stating pharmacy did not get rx for her folic Acid, and also she is needing a tier exception on the xarelto. Can call [phone]..sent folic acid will proceed w/PA...[name]

## 2017-01-02 NOTE — Telephone Encounter (Signed)
Pt daughter and needed an update on this PA

## 2017-01-04 NOTE — Telephone Encounter (Signed)
Completed PA on cover-my-meds waiting on approval status.Marland KitchenJohny Williams The plan will fax you a determination, typically within 1 to 5 business days.

## 2017-01-09 NOTE — Telephone Encounter (Signed)
Rec'd fax back med has been approved it states " We have approved Xarelto from dates beginning 12/04/16-12/03/17 w/the tier 1 exception copay. Notified daughter w/approval status/...[name]

## 2017-01-11 ENCOUNTER — Encounter: Payer: Self-pay | Admitting: Internal Medicine

## 2017-01-11 ENCOUNTER — Ambulatory Visit (INDEPENDENT_AMBULATORY_CARE_PROVIDER_SITE_OTHER): Payer: Medicare PPO | Admitting: Internal Medicine

## 2017-01-11 DIAGNOSIS — R059 Cough, unspecified: Secondary | ICD-10-CM

## 2017-01-11 DIAGNOSIS — R05 Cough: Secondary | ICD-10-CM | POA: Diagnosis not present

## 2017-01-11 MED ORDER — PREDNISONE 20 MG PO TABS: 40.0000 mg | ORAL_TABLET | Freq: Every day | ORAL | 0 refills | Status: DC

## 2017-01-11 MED ORDER — RIVAROXABAN 15 MG PO TABS: 15.0000 mg | ORAL_TABLET | Freq: Every day | ORAL | 0 refills | Status: DC

## 2017-01-11 NOTE — Patient Instructions (Signed)
We have given you the xarelto prescription.   We have sent in prednisone for the breathing. Take 2 pills a day for 5 days, then stop.

## 2017-01-11 NOTE — Progress Notes (Signed)
Pre visit review using our clinic review tool, if applicable. No additional management support is needed unless otherwise documented below in the visit note. 

## 2017-01-11 NOTE — Assessment & Plan Note (Signed)
Given the mild wheeze on exam rx for prednisone. No findings to suggest pneumonia. She declines albuterol inhaler need.

## 2017-01-11 NOTE — Progress Notes (Signed)
   Subjective:    Patient ID: [name], female    DOB: 17-Feb-1928, 83 y.o.   MRN: [id number]  HPI The patient is an 83 YO female coming in for cough and congestion going on for about 10 days. She is overall getting better but feels she has plateuaed. She is having some SOB with exertion. At the beginning had fevers and chills. Not in the last several days. She is having cough with intermittent sputum which is yellow to clear. Not much nasal congestion now. No ear pain or discharge. Taking otc meds which are not helping at all.   Review of Systems  Constitutional: Positive for activity change and fatigue. Negative for appetite change, chills, fever and unexpected weight change.  HENT: Negative.   Eyes: Negative.   Respiratory: Positive for cough and shortness of breath. Negative for chest tightness and wheezing.   Cardiovascular: Negative.   Gastrointestinal: Negative.   Musculoskeletal: Negative.       Objective:   Physical Exam  Constitutional: She is oriented to person, place, and time. She appears well-developed and well-nourished.  HENT:  Head: Normocephalic and atraumatic.  Oropharynx with mild redness and clear drainage.   Eyes: EOM are normal.  Neck: Normal range of motion.  Cardiovascular: Normal rate and regular rhythm.   Pulmonary/Chest: Effort normal. No respiratory distress. She has wheezes. She has no rales. She exhibits no tenderness.  Mild wheeze which clears partially with cough.  Abdominal: Soft.  Neurological: She is alert and oriented to person, place, and time. Coordination abnormal.  Uses cane, stable from prior  Skin: Skin is warm and dry.   Vitals:   01/11/17 1314  BP: (!) 150/58  Pulse: 74  Temp: 97.6 F (36.4 C)  TempSrc: Oral  SpO2: 95%  Weight: 117 lb (53.1 kg)  Height: 5\' 1"  (1.549 m)      Assessment & Plan:

## 2017-01-29 ENCOUNTER — Encounter: Payer: Self-pay | Admitting: Internal Medicine

## 2017-03-12 ENCOUNTER — Other Ambulatory Visit: Payer: Self-pay

## 2017-03-12 ENCOUNTER — Emergency Department (HOSPITAL_COMMUNITY): Payer: Medicare PPO

## 2017-03-12 ENCOUNTER — Telehealth: Payer: Self-pay | Admitting: Internal Medicine

## 2017-03-12 ENCOUNTER — Encounter (HOSPITAL_COMMUNITY): Payer: Self-pay | Admitting: Emergency Medicine

## 2017-03-12 DIAGNOSIS — Z7901 Long term (current) use of anticoagulants: Secondary | ICD-10-CM | POA: Diagnosis not present

## 2017-03-12 DIAGNOSIS — Z79899 Other long term (current) drug therapy: Secondary | ICD-10-CM | POA: Diagnosis not present

## 2017-03-12 DIAGNOSIS — I4891 Unspecified atrial fibrillation: Secondary | ICD-10-CM | POA: Insufficient documentation

## 2017-03-12 DIAGNOSIS — R531 Weakness: Secondary | ICD-10-CM | POA: Insufficient documentation

## 2017-03-12 DIAGNOSIS — Z8673 Personal history of transient ischemic attack (TIA), and cerebral infarction without residual deficits: Secondary | ICD-10-CM | POA: Diagnosis not present

## 2017-03-12 DIAGNOSIS — I10 Essential (primary) hypertension: Secondary | ICD-10-CM | POA: Diagnosis not present

## 2017-03-12 LAB — BASIC METABOLIC PANEL
Anion gap: 11 (ref 5–15)
BUN: 14 mg/dL (ref 6–20)
CO2: 22 mmol/L (ref 22–32)
Calcium: 10 mg/dL (ref 8.9–10.3)
Chloride: 104 mmol/L (ref 101–111)
Creatinine, Ser: 1.1 mg/dL — ABNORMAL HIGH (ref 0.44–1.00)
GFR calc Af Amer: 51 mL/min — ABNORMAL LOW (ref >60)
GFR calc non Af Amer: 44 mL/min — ABNORMAL LOW (ref >60)
Glucose, Bld: 162 mg/dL — ABNORMAL HIGH (ref 65–99)
Potassium: 4.1 mmol/L (ref 3.5–5.1)
Sodium: 137 mmol/L (ref 135–145)

## 2017-03-12 LAB — CBC
HCT: 42.5 % (ref 36.0–46.0)
Hemoglobin: 14.5 g/dL (ref 12.0–15.0)
MCH: 28.8 pg (ref 26.0–34.0)
MCHC: 34.1 g/dL (ref 30.0–36.0)
MCV: 84.3 fL (ref 78.0–100.0)
Platelets: 298 10*3/uL (ref 150–400)
RBC: 5.04 MIL/uL (ref 3.87–5.11)
RDW: 16.4 % — ABNORMAL HIGH (ref 11.5–15.5)
WBC: 7.4 10*3/uL (ref 4.0–10.5)

## 2017-03-12 NOTE — ED Triage Notes (Signed)
Pt to ED c/o A-Fib acting up again - last time this happened was 5 years ago, in which she had an ablation for. Pt checked her HR at home and states it got fast like when she used to have A-Fib. Pt also reports a couple episodes of dizziness, but no LOC. Pt denies CP/SOB/N/V.

## 2017-03-12 NOTE — Telephone Encounter (Signed)
Patient Name: [name]  DOB: 1928-08-17    Initial Comment caller states her mom has an irregular heartbeat. It races an then it goes back regular. It is currently at 96. her resting heart rate is usually around 60 . She feels weak    Nurse Assessment  Nurse: Raphael Gibney, RN, Vanita Ingles Date/Time (Eastern Time): 03/12/2017 2:17:52 PM  Confirm and document reason for call. If symptomatic, describe symptoms. ---Caller states her mother has irregular heartbeat. Had cardiac ablation about 5 years ago. She is tired and weak. Heart races and then slows down. Pulse 96 and irregular. No chest pain or SOB. Thinks it started yesterday.  Does the patient have any new or worsening symptoms? ---Yes  Will a triage be completed? ---Yes  Related visit to physician within the last 2 weeks? ---No  Does the PT have any chronic conditions? (i.e. diabetes, asthma, etc.) ---Yes  List chronic conditions. ---history of A fib; CVA;  Is this a behavioral health or substance abuse call? ---No     Guidelines    Guideline Title Affirmed Question Affirmed Notes  Heart Rate and Heartbeat Questions Age > 60 years (Exception: brief heart beat symptoms that went away and now feels well)    Final Disposition User   See Physician within 4 Hours (or PCP triage) Raphael Gibney, RN, Vera    Comments  No appts available within 4 hrs at [facility] does not want to go to urgent care or another office. Please call daughter back regarding appt.   Referrals  GO TO FACILITY REFUSED   Disagree/Comply: Comply

## 2017-03-13 ENCOUNTER — Telehealth (HOSPITAL_COMMUNITY): Payer: Self-pay | Admitting: *Deleted

## 2017-03-13 ENCOUNTER — Emergency Department (HOSPITAL_COMMUNITY)
Admission: EM | Admit: 2017-03-13 | Discharge: 2017-03-13 | Disposition: A | Discharge status: Home / Self Care | Payer: Medicare PPO | Attending: Emergency Medicine | Admitting: Emergency Medicine

## 2017-03-13 DIAGNOSIS — I4891 Unspecified atrial fibrillation: Secondary | ICD-10-CM

## 2017-03-13 DIAGNOSIS — R531 Weakness: Secondary | ICD-10-CM

## 2017-03-13 LAB — URINALYSIS, ROUTINE W REFLEX MICROSCOPIC
Bilirubin Urine: NEGATIVE
Glucose, UA: NEGATIVE mg/dL
Hgb urine dipstick: NEGATIVE
Ketones, ur: NEGATIVE mg/dL
Nitrite: NEGATIVE
Protein, ur: NEGATIVE mg/dL
Specific Gravity, Urine: 1.011 (ref 1.005–1.030)
pH: 5 (ref 5.0–8.0)

## 2017-03-13 LAB — TROPONIN I: Troponin I: <0.03 ng/mL (ref <0.03)

## 2017-03-13 MED ORDER — SODIUM CHLORIDE 0.9 % IV SOLN
INTRAVENOUS | Status: AC | PRN
  Administered 2017-03-13: 150 mL/h via INTRAVENOUS

## 2017-03-13 MED ORDER — ETOMIDATE 2 MG/ML IV SOLN
0.2000 mg/kg | Freq: Once | INTRAVENOUS | Status: DC
  Filled 2017-03-13: qty 10

## 2017-03-13 MED ORDER — ETOMIDATE 2 MG/ML IV SOLN
INTRAVENOUS | Status: AC | PRN
  Administered 2017-03-13: 9 mg via INTRAVENOUS

## 2017-03-13 MED ORDER — DILTIAZEM HCL 25 MG/5ML IV SOLN
15.0000 mg | Freq: Once | INTRAVENOUS | Status: AC
  Administered 2017-03-13: 15 mg via INTRAVENOUS
  Filled 2017-03-13: qty 5

## 2017-03-13 NOTE — ED Notes (Signed)
Pt Sinus Rhythm on the monitor.

## 2017-03-13 NOTE — ED Notes (Signed)
Dr Venora Maples and RRT jamie at bedside

## 2017-03-13 NOTE — ED Provider Notes (Signed)
[facility] DEPT Provider Note   CSN: [id number] Arrival date & time: 03/12/17  2208  By signing my name below, I, Arianna Nassar, attest that this documentation has been prepared under the direction and in the presence of Jola Schmidt, MD.  Electronically Signed: Julien Nordmann, ED Scribe. 03/13/17. 3:45 AM.    History   Chief Complaint Chief Complaint  Patient presents with  . Atrial Fibrillation   The history is provided by the patient. No language interpreter was used.    HPI Comments: [name] is a 83 y.o. female brought in by ambulance, who has a PMhx of HTN, a-fib, CVA, and SVT presents to the Emergency Department complaining of acute onset, moderate, palpitations x yesterday. Pt describes her HR as irregular and fast. She reports associated generalized weakness that began yesterday morning. She says she has been tolerating PO intake appropriately. Pt is followed by Dr. Lovena Le of cardiology and reports being diagnosed with atrial fibrillation several years ago. She is currently on Xarelto and is currently compliant. Pt denies fever, chills, cough, chest pain, shortness of breath, dysuria, hematuria, urinary frequency and urinary urgency.  Past Medical History:  Diagnosis Date  . Atrial fibrillation ([place])   . Cervical arthritis ([place])   . COLONIC POLYPS, HX OF 09/24/2007  . H/O: hysterectomy    fibroid tumors  . Hip arthritis   . History of colonic polyps   . History of stroke   . HTN (hypertension)   . Hyperlipidemia   . Stroke ([place]) 02/2010  . SUPRAVENTRICULAR ARRHYTHMIA 12/20/2010   s/p RFCA 05/2010-Dr. Lovena Le    Patient Active Problem List   Diagnosis Date Noted  . Cough 01/11/2017  . Routine health maintenance 12/01/2011  . Stroke ([place]) 03/09/2011  . Atrial fibrillation ([place]) 03/09/2011  . Hyperlipidemia 10/09/2008  . Essential hypertension 09/24/2007    Past Surgical History:  Procedure Laterality Date  . ABDOMINAL HYSTERECTOMY    . BREAST  LUMPECTOMY     left  . COLON SURGERY  03/19/2009   colon cancer   . FB removal from popliteal fossae    . INTRACAPSULAR CATARACT EXTRACTION      Left eye Oct 18, 2009  right eye Jan '11 By Dr Era Skeen  . PILONIDAL CYST EXCISION    . TRANSTHORACIC ECHOCARDIOGRAM  2007    OB History    No data available       Home Medications    Prior to Admission medications   Medication Sig Start Date End Date Taking? Authorizing Provider  amiodarone (PACERONE) 200 MG tablet Take 1 by mouth daily 12/27/16   Hoyt Koch, MD  amLODipine (NORVASC) 10 MG tablet Take 1 tablet (10 mg total) by mouth daily. 12/27/16   Hoyt Koch, MD  folic acid-pyridoxine-cyancobalamin (FOLBIC) 2.5-25-2 MG TABS tablet Take 1 tablet by mouth daily. 12/29/16   Hoyt Koch, MD  losartan (COZAAR) 100 MG tablet Take 1 tablet (100 mg total) by mouth daily. 12/27/16   Hoyt Koch, MD  nitroGLYCERIN (NITROSTAT) 0.4 MG SL tablet Place 0.4 mg under the tongue every 5 (five) minutes as needed for chest pain (MAX 3 TABLETS).     Historical Provider, MD  polyethylene glycol (MIRALAX / GLYCOLAX) packet Take 17 g by mouth daily as needed (constipation).     Historical Provider, MD  predniSONE (DELTASONE) 20 MG tablet Take 2 tablets (40 mg total) by mouth daily with breakfast. 01/11/17   Hoyt Koch, MD  Rivaroxaban Alveda Reasons) 15  MG TABS tablet Take 1 tablet (15 mg total) by mouth daily. 01/11/17   Hoyt Koch, MD    Family History Family History  Problem Relation Age of Onset  . Heart attack Mother   . Stroke Mother   . Coronary artery disease Mother   . Heart disease Mother   . Cancer Father     Colon Cancer  . Diabetes Daughter   . Cancer Daughter     Leukemia    Social History Social History  Substance Use Topics  . Smoking status: Never Smoker  . Smokeless tobacco: Never Used  . Alcohol use No     Comment: rare     Allergies   Codeine   Review of  Systems Review of Systems  A complete 10 system review of systems was obtained and all systems are negative except as noted in the HPI and PMH.    Physical Exam Updated Vital Signs BP 123/81   Pulse 95   Temp 98 F (36.7 C) (Oral)   Resp 18   SpO2 97%   Physical Exam  Constitutional: She is oriented to person, place, and time. She appears well-developed and well-nourished. No distress.  HENT:  Head: Normocephalic and atraumatic.  Eyes: EOM are normal.  Neck: Normal range of motion.  Cardiovascular: Normal rate and normal heart sounds.  An irregularly irregular rhythm present.  Pulmonary/Chest: Effort normal and breath sounds normal.  Abdominal: Soft. She exhibits no distension. There is no tenderness.  Musculoskeletal: Normal range of motion.  Neurological: She is alert and oriented to person, place, and time.  Skin: Skin is warm and dry.  Psychiatric: She has a normal mood and affect. Judgment normal.  Nursing note and vitals reviewed.    ED Treatments / Results  DIAGNOSTIC STUDIES: Oxygen Saturation is 97% on RA, normal by my interpretation.  COORDINATION OF CARE:  3:39 AM Discussed treatment plan with pt at bedside and pt agreed to plan.  Labs (all labs ordered are listed, but only abnormal results are displayed) Labs Reviewed  BASIC METABOLIC PANEL - Abnormal; Notable for the following:       Result Value   Glucose, Bld 162 (*)    Creatinine, Ser 1.10 (*)    GFR calc non Af Amer 44 (*)    GFR calc Af Amer 51 (*)    All other components within normal limits  CBC - Abnormal; Notable for the following:    RDW 16.4 (*)    All other components within normal limits  URINALYSIS, ROUTINE W REFLEX MICROSCOPIC - Abnormal; Notable for the following:    Leukocytes, UA TRACE (*)    Bacteria, UA RARE (*)    Squamous Epithelial / LPF 0-5 (*)    All other components within normal limits  TROPONIN I    EKG  EKG Interpretation #1  Date/Time:  Monday March 12 2017  22:17:08 EDT Ventricular Rate:  96 PR Interval:    QRS Duration: 90 QT Interval:  364 QTC Calculation: 459 R Axis:   -55 Text Interpretation:  Atrial fibrillation Left anterior fascicular block ST & T wave abnormality, consider lateral ischemia Abnormal ECG afib new since tracing in 2016 Confirmed by   MD, Lennette Bihari ([id number]) on 03/13/2017 3:02:17 AM Also confirmed by Venora Maples  MD, Lennette Bihari ([id number]), editor [place]  CCT, BEVERLY ([id number])  on 03/13/2017 6:49:50 AM         Radiology Dg Chest 2 View  Result Date: 03/12/2017 CLINICAL DATA:  Acute onset  of generalized weakness. Initial encounter. EXAM: CHEST  2 VIEW COMPARISON:  Chest radiograph performed 10/06/2016 FINDINGS: The lungs are well-aerated and clear. There is no evidence of focal opacification, pleural effusion or pneumothorax. The heart is normal in size; the mediastinal contour is within normal limits. No acute osseous abnormalities are seen. IMPRESSION: No acute cardiopulmonary process seen. Electronically Signed   By: Garald Balding M.D.   On: 03/12/2017 23:17    Procedures .Cardioversion Performed by: Jola Schmidt Authorized by: Jola Schmidt   Consent:    Consent obtained:  Verbal   Consent given by:  Patient Pre-procedure details:    Cardioversion basis:  Elective   Rhythm:  Atrial fibrillation Attempt one:    Cardioversion mode:  Synchronous   Waveform:  Biphasic   Shock (Joules):  150   Shock outcome:  Conversion to normal sinus rhythm Post-procedure details:    Patient tolerance of procedure:  Tolerated well, no immediate complications      Procedural sedation Performed by: Hoy Morn Consent: Verbal consent obtained. Risks and benefits: risks, benefits and alternatives were discussed Required items: required blood products, implants, devices, and special equipment available Patient identity confirmed: arm band and provided demographic data Time out: Immediately prior to procedure a "time out" was  called to verify the correct patient, procedure, equipment, support staff and site/side marked as required. Sedation type: moderate (conscious) sedation NPO time confirmed and considedered Sedatives: ETOMIDATE Physician Time at Bedside: 12 Vitals: Vital signs were monitored during sedation. Cardiac Monitor, pulse oximeter Patient tolerance: Patient tolerated the procedure well with no immediate complications. Comments: Pt with uneventful recovered. Returned to pre-procedural sedation baseline   Medications Ordered in ED Medications - No data to display   Initial Impression / Assessment and Plan / ED Course  I have reviewed the triage vital signs and the nursing notes.  Pertinent labs & imaging results that were available during my care of the patient were reviewed by me and considered in my medical decision making (see chart for details).     Patient did not convert with IV Cardizem.  She remains in atrial fibrillation.  The remainder of her workup is without abnormality.  She is compliant with her anticoagulation.  She is a good candidate for DC cardioversion in the emergency department  Patient underwent cardioversion in the emergency department.  She tolerated the procedure well.  She returned immediately to sinus rhythm and remained in sinus rhythm for 1 hour post procedure.  She can safely be discharged home to follow-up with her primary care physician.  No change to her medications.  Patient will remain on anticoagulation.  Final Clinical Impressions(s) / ED Diagnoses   Final diagnoses:  Weakness  Atrial fibrillation, unspecified type ([place])   I personally performed the services described in this documentation, which was scribed in my presence. The recorded information has been reviewed and is accurate.     New Prescriptions New Prescriptions   No medications on file     Jola Schmidt, MD 03/13/17 [phone]

## 2017-03-13 NOTE — Sedation Documentation (Signed)
Shock administered at 150J 

## 2017-03-13 NOTE — ED Notes (Signed)
ED Provider at bedside. 

## 2017-03-13 NOTE — ED Notes (Signed)
Consent form signed for conscious sedation/cardioversion after procedure explained to patient by Dr. Venora Maples. Airway cart and Code cart at bedside, pt placed on zoll pads and nasal cannula. Respiratory called to stand by during conscious sedation.

## 2017-03-13 NOTE — Telephone Encounter (Signed)
LMOM for pt to cl and sched f/u from ER with Roderic Palau, NP in afib clinic

## 2017-03-29 NOTE — Progress Notes (Signed)
Cardiology Office Note Date:  03/30/2017  Patient ID:  [name], [name] 09/07/1928, MRN [id number] PCP:  Hoyt Koch, MD  Cardiologist:  Dr. Lovena Le, last Dec 2015    Chief Complaint:  f/u on ER visit  History of Present Illness: [name] is a 83 y.o. female with history of SVt ablated, paroxysmal Afib, HTN, stroke, HLD last saw by Dr. Lovena Le Dec 2015, at that time she was doing well on amiodarone without palpitations and dose was decreased to 200mg  4 days/week and 100mg  on 3, recommended to have a 1 year f/u, he mentioned a possible syncopal event, ?ETOH.  She was in the ER 03/13/17 with c/o palpitations, transported via EMS with c/o palpitations associated with weakness, she was found in AF initaily treated with IC dilt without improvement and reported compliance with her Xarelto and was cardioverted.  She is accompanied by her daughter as well as via telephone with an out of town daughter who is an Therapist, sports.  The patient has been doing very well for the lat few years, the day prior to going to the ER while at church she started to feel unusually fatigued, she had no perception of palpitations, no symptoms otherwise at all.  The next day remained feeing without energy and her daughter says usually constantly moving and going, and that day not at all, at some point became aware of her heart feeling fast, still no CP or SOB, she had no dizziness, near syncope or syncope.  They went to the ER and as above found in AF.  Since then she has again felt very well, without symptoms or exertional intolerances.  She does her ADL's without difficulty.  She has not missed any xarelto doses she says since she started it years ago, denies any bleeding or signs of bleeding.  She denies any changes to her PMHx since her last visit.  She mentioned yesterday she felt like she was coming down with something, a low grade temp, today feels well again and afebrile.  No symptos of any kind yesterday or of  late otherwise.  Past Medical History:  Diagnosis Date  . Atrial fibrillation ([place])   . Cervical arthritis ([place])   . COLONIC POLYPS, HX OF 09/24/2007  . H/O: hysterectomy    fibroid tumors  . Hip arthritis   . History of colonic polyps   . History of stroke   . HTN (hypertension)   . Hyperlipidemia   . Stroke ([place]) 02/2010  . SUPRAVENTRICULAR ARRHYTHMIA 12/20/2010   s/p RFCA 05/2010-Dr. Lovena Le    Past Surgical History:  Procedure Laterality Date  . ABDOMINAL HYSTERECTOMY    . BREAST LUMPECTOMY     left  . COLON SURGERY  03/19/2009   colon cancer   . FB removal from popliteal fossae    . INTRACAPSULAR CATARACT EXTRACTION      Left eye Oct 18, 2009  right eye Jan '11 By Dr Era Skeen  . PILONIDAL CYST EXCISION    . TRANSTHORACIC ECHOCARDIOGRAM  2007    Current Outpatient Prescriptions  Medication Sig Dispense Refill  . amiodarone (PACERONE) 200 MG tablet Take 1 by mouth daily 90 tablet 3  . amLODipine (NORVASC) 10 MG tablet Take 1 tablet (10 mg total) by mouth daily. 90 tablet 3  . losartan (COZAAR) 100 MG tablet Take 1 tablet (100 mg total) by mouth daily. 90 tablet 3  . nitroGLYCERIN (NITROSTAT) 0.4 MG SL tablet Place 0.4 mg under the tongue every  5 (five) minutes as needed for chest pain (MAX 3 TABLETS).     . polyethylene glycol (MIRALAX / GLYCOLAX) packet Take 17 g by mouth daily as needed (constipation).     . Rivaroxaban (XARELTO) 15 MG TABS tablet Take 1 tablet (15 mg total) by mouth daily. 30 tablet 0   No current facility-administered medications for this visit.     Allergies:   Codeine   Social History:  The patient  reports that she has never smoked. She has never used smokeless tobacco. She reports that she does not drink alcohol or use drugs.   Family History:  The patient's family history includes Cancer in her daughter and father; Coronary artery disease in her mother; Diabetes in her daughter; Heart attack in her mother; Heart disease in her mother;  Stroke in her mother.  ROS:  Please see the history of present illness.  All other systems are reviewed and otherwise negative.   PHYSICAL EXAM:  VS:  BP 138/62   Pulse 62   Ht 5\' 1"  (1.549 m)   Wt 117 lb (53.1 kg)   BMI 22.11 kg/m  BMI: Body mass index is 22.11 kg/m. Well nourished, well developed, in no acute distress  HEENT: normocephalic, atraumatic  Neck: no JVD, carotid bruits or masses Cardiac:  RRR; no significant murmurs, no rubs, or gallops Lungs:  CTA b/l, no wheezing, rhonchi or rales  Abd: soft, nontender MS: no deformity or atrophy Ext: no edema  Skin: warm and dry, no rash Neuro:  No gross deficits appreciated Psych: euthymic mood, full affect  EKG:  Done today and reviewed by myself shows SR, 63bpm, 1st degree AVblock, PR 259ms, QRS 78ms, QTc 429ms 03/12/17: looks AFlutter, 96bpm, QRS 53ms   09/25/07: TTE SUMMARY - There is upper septal thickening. Overall left ventricular    systolic function was vigorous. Left ventricular ejection    fraction was estimated to be 70 %. There was no diagnostic    evidence of left ventricular regional wall motion    abnormalities. There was no systolic anterior motion of the    mitral valve. - The left atrium was mildly dilated. - There is no obvious cardiac source of embolus IMPRESSIONS - There is no obvious cardiac source of embolus.  Recent Labs: 12/25/2016: ALT 18; TSH 1.68 03/12/2017: BUN 14; Creatinine, Ser 1.10; Hemoglobin 14.5; Platelets 298; Potassium 4.1; Sodium 137  12/25/2016: Cholesterol 216; HDL 107.70; LDL Cholesterol 96; Total CHOL/HDL Ratio 2; Triglycerides 63.0; VLDL 12.6   Estimated Creatinine Clearance: 27.7 mL/min (A) (by C-G formula based on SCr of 1.1 mg/dL (H)).   Wt Readings from Last 3 Encounters:  03/30/17 117 lb (53.1 kg)  03/13/17 117 lb 1 oz (53.1 kg)  01/11/17 117 lb (53.1 kg)     Other studies reviewed: Additional studies/records reviewed today include:  summarized above  ASSESSMENT AND PLAN:  1. Paroxysmal AFib     CHA2DS2Vasc is at least 6, on xarelto     s/p DCCV   First episode of AF, looked flutter in years, she is compliant with meds particularly her a/c Discussed at length, for now would not make any changes given the infrequent nature of her AF, should she start having more would consider resuming her amio to 200mg  daily  Jan LFTs TSH looked OK, counseled on annual eye exams, and will send her for PFTs Will update her echo   2. HTN     Looks OK, no changes today   Disposition: F/u  with Dr. Lovena Le in 1 year, sooner if needed.  Current medicines are reviewed at length with the patient today.  The patient did not have any concerns regarding medicines.  Haywood Lasso, PA-C 03/30/2017 1:22 PM     [place] [address] [place]  [id number] [phone] (office)  [phone] (fax)

## 2017-03-30 ENCOUNTER — Ambulatory Visit (INDEPENDENT_AMBULATORY_CARE_PROVIDER_SITE_OTHER): Payer: Medicare PPO | Admitting: Physician Assistant

## 2017-03-30 VITALS — BP 138/62 | HR 62 | Ht 61.0 in | Wt 117.0 lb

## 2017-03-30 DIAGNOSIS — I480 Paroxysmal atrial fibrillation: Secondary | ICD-10-CM

## 2017-03-30 DIAGNOSIS — I10 Essential (primary) hypertension: Secondary | ICD-10-CM | POA: Diagnosis not present

## 2017-03-30 DIAGNOSIS — Z79899 Other long term (current) drug therapy: Secondary | ICD-10-CM | POA: Diagnosis not present

## 2017-03-30 MED ORDER — NITROGLYCERIN 0.4 MG SL SUBL: 0.4000 mg | SUBLINGUAL_TABLET | SUBLINGUAL | 1 refills | Status: AC | PRN

## 2017-03-30 NOTE — Patient Instructions (Addendum)
Medication Instructions:    Your physician recommends that you continue on your current medications as directed. Please refer to the Current Medication list given to you today.   If you need a refill on your cardiac medications before your next appointment, please call your pharmacy.  Labwork:  NONE ORDERED  TODAY   Testing/Procedures: Your physician has requested that you have an echocardiogram. Echocardiography is a painless test that uses sound waves to create images of your heart. It provides your doctor with information about the size and shape of your heart and how well your heart's chambers and valves are working. This procedure takes approximately one hour. There are no restrictions for this procedure.  Your physician has recommended that you have a pulmonary function test. Pulmonary Function Tests are a group of tests that measure how well air moves in and out of your lungs.      Follow-Up: Your physician wants you to follow-up in: [place] will receive a reminder letter in the mail two months in advance. If you don't receive a letter, please call our office to schedule the follow-up appointment.     Any Other Special Instructions Will Be Listed Below (If Applicable).

## 2017-04-02 ENCOUNTER — Ambulatory Visit (INDEPENDENT_AMBULATORY_CARE_PROVIDER_SITE_OTHER): Payer: Medicare PPO | Admitting: Internal Medicine

## 2017-04-02 DIAGNOSIS — Z79899 Other long term (current) drug therapy: Secondary | ICD-10-CM | POA: Diagnosis not present

## 2017-04-02 LAB — PULMONARY FUNCTION TEST
DL/VA % pred: 104 %
DL/VA: 4.26 ml/min/mmHg/L
DLCO cor % pred: 81 %
DLCO cor: 14.21 ml/min/mmHg
DLCO unc % pred: 77 %
DLCO unc: 13.61 ml/min/mmHg
FEF 25-75 Post: 1.45 L/sec
FEF 25-75 Pre: 1.56 L/sec
FEF2575-%Change-Post: -7 %
FEF2575-%Pred-Post: 187 %
FEF2575-%Pred-Pre: 202 %
FEV1-%Change-Post: -2 %
FEV1-%Pred-Post: 122 %
FEV1-%Pred-Pre: 126 %
FEV1-Post: 1.51 L
FEV1-Pre: 1.55 L
FEV1FVC-%Change-Post: 0 %
FEV1FVC-%Pred-Pre: 115 %
FEV6-%Change-Post: -4 %
FEV6-%Pred-Post: 111 %
FEV6-%Pred-Pre: 116 %
FEV6-Post: 1.76 L
FEV6-Pre: 1.84 L
FEV6FVC-%Change-Post: -1 %
FEV6FVC-%Pred-Post: 105 %
FEV6FVC-%Pred-Pre: 106 %
FVC-%Change-Post: -3 %
FVC-%Pred-Post: 105 %
FVC-%Pred-Pre: 109 %
FVC-Post: 1.81 L
FVC-Pre: 1.87 L
Post FEV1/FVC ratio: 83 %
Post FEV6/FVC ratio: 97 %
Pre FEV1/FVC ratio: 83 %
Pre FEV6/FVC Ratio: 98 %
RV % pred: 99 %
RV: 2.27 L
TLC % pred: 97 %
TLC: 4.18 L

## 2017-04-02 NOTE — Progress Notes (Signed)
PFT completed today 04/02/17.

## 2017-04-13 ENCOUNTER — Ambulatory Visit (HOSPITAL_COMMUNITY): Payer: Medicare PPO | Attending: Cardiology

## 2017-04-13 ENCOUNTER — Other Ambulatory Visit: Payer: Self-pay

## 2017-04-13 DIAGNOSIS — I119 Hypertensive heart disease without heart failure: Secondary | ICD-10-CM | POA: Insufficient documentation

## 2017-04-13 DIAGNOSIS — I480 Paroxysmal atrial fibrillation: Secondary | ICD-10-CM

## 2017-04-13 DIAGNOSIS — Z8249 Family history of ischemic heart disease and other diseases of the circulatory system: Secondary | ICD-10-CM | POA: Diagnosis not present

## 2017-04-13 DIAGNOSIS — E785 Hyperlipidemia, unspecified: Secondary | ICD-10-CM | POA: Insufficient documentation

## 2017-04-13 DIAGNOSIS — I340 Nonrheumatic mitral (valve) insufficiency: Secondary | ICD-10-CM | POA: Insufficient documentation

## 2017-04-18 ENCOUNTER — Telehealth: Payer: Self-pay | Admitting: Internal Medicine

## 2017-04-18 NOTE — Telephone Encounter (Signed)
Spoke with patient to schedule awv. Pt stated that she will give office a call back to schedule appt.

## 2017-09-11 ENCOUNTER — Telehealth: Payer: Self-pay | Admitting: Internal Medicine

## 2017-09-11 NOTE — Telephone Encounter (Signed)
Left message for [name] to call back; she is listed on patient's chart as HC POA.

## 2017-09-11 NOTE — Telephone Encounter (Signed)
New message    Pt daughter is calling asking for a call back. Please call.

## 2017-09-11 NOTE — Telephone Encounter (Signed)
LMTCB

## 2017-09-11 NOTE — Telephone Encounter (Signed)
Follow Up:; ° ° °Returning your call. °

## 2017-09-13 NOTE — Telephone Encounter (Signed)
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  Will forward to Myrtie Hawk, RN to address when she is back as this is not urgent and several messages have been left to call back

## 2017-09-18 NOTE — Telephone Encounter (Signed)
Call placed to [facility], Pt emergency contact.  Per [name] Pt had a recent return of Afib and went to [facility] for treatment.  Pt given one dose diltiziam and changed amiodarone back to 100 mg BID daily-per daughter.  Will attempt to get records of Pt visit.  Will review with Dr. Lovena Le.

## 2017-09-26 ENCOUNTER — Telehealth: Payer: Self-pay | Admitting: Internal Medicine

## 2017-09-26 NOTE — Telephone Encounter (Signed)
Records received from [facility]. Gave to Jonathon Jordan Nurse.

## 2017-10-01 NOTE — Telephone Encounter (Signed)
Left detailed message for [name], EC.  Notified that office had received hospital notes from [facility] as requested and scheduling would be calling her to set up an appt for Pt.

## 2017-10-15 ENCOUNTER — Encounter: Payer: Self-pay | Admitting: Internal Medicine

## 2017-10-29 ENCOUNTER — Ambulatory Visit: Payer: Medicare PPO | Admitting: Internal Medicine

## 2017-10-29 ENCOUNTER — Encounter: Payer: Self-pay | Admitting: Internal Medicine

## 2017-10-29 VITALS — BP 166/62 | HR 69 | Ht 61.0 in | Wt 118.4 lb

## 2017-10-29 DIAGNOSIS — Z79899 Other long term (current) drug therapy: Secondary | ICD-10-CM

## 2017-10-29 DIAGNOSIS — I480 Paroxysmal atrial fibrillation: Secondary | ICD-10-CM | POA: Diagnosis not present

## 2017-10-29 NOTE — Patient Instructions (Addendum)

## 2017-10-29 NOTE — Progress Notes (Signed)
HPI [name] returns today for ongoing evaluation and management of paroxysmal atrial fibrillation on amiodarone, remote stroke, remote SVT status post remote ablation. In addition, she has hypertension. No chest pain or shortness of breath. She has had one episode of recurrent atrial fibrillation which resulted in a visit to the emergency room. She is returned back to sinus rhythm. She denies chest pain or shortness of breath. No syncope. Allergies  Allergen Reactions  . Codeine Rash    fine rash     Current Outpatient Medications  Medication Sig Dispense Refill  . amiodarone (PACERONE) 200 MG tablet Take 1 by mouth daily 90 tablet 3  . amLODipine (NORVASC) 10 MG tablet Take 1 tablet (10 mg total) by mouth daily. 90 tablet 3  . losartan (COZAAR) 100 MG tablet Take 1 tablet (100 mg total) by mouth daily. 90 tablet 3  . nitroGLYCERIN (NITROSTAT) 0.4 MG SL tablet Place 1 tablet (0.4 mg total) under the tongue every 5 (five) minutes as needed for chest pain (MAX 3 TABLETS). 75 tablet 1  . polyethylene glycol (MIRALAX / GLYCOLAX) packet Take 17 g by mouth daily as needed (constipation).     . Rivaroxaban (XARELTO) 15 MG TABS tablet Take 1 tablet (15 mg total) by mouth daily. 30 tablet 0   No current facility-administered medications for this visit.      Past Medical History:  Diagnosis Date  . Atrial fibrillation ([place])   . Cervical arthritis   . COLONIC POLYPS, HX OF 09/24/2007  . H/O: hysterectomy    fibroid tumors  . Hip arthritis   . History of colonic polyps   . History of stroke   . HTN (hypertension)   . Hyperlipidemia   . Stroke ([place]) 02/2010  . SUPRAVENTRICULAR ARRHYTHMIA 12/20/2010   s/p RFCA 05/2010-Dr. Lovena Le    ROS:   All systems reviewed and negative except as noted in the HPI.   Past Surgical History:  Procedure Laterality Date  . ABDOMINAL HYSTERECTOMY    . BREAST LUMPECTOMY     left  . COLON SURGERY  03/19/2009   colon cancer   . FB removal  from popliteal fossae    . INTRACAPSULAR CATARACT EXTRACTION      Left eye Oct 18, 2009  right eye Jan '11 By Dr Era Skeen  . PILONIDAL CYST EXCISION    . TRANSTHORACIC ECHOCARDIOGRAM  2007     Family History  Problem Relation Age of Onset  . Heart attack Mother   . Stroke Mother   . Coronary artery disease Mother   . Heart disease Mother   . Cancer Father        Colon Cancer  . Diabetes Daughter   . Cancer Daughter        Leukemia     Social History   Socioeconomic History  . Marital status: Widowed    Spouse name: Not on file  . Number of children: 6  . Years of education: Not on file  . Highest education level: Not on file  Social Needs  . Financial resource strain: Not on file  . Food insecurity - worry: Not on file  . Food insecurity - inability: Not on file  . Transportation needs - medical: Not on file  . Transportation needs - non-medical: Not on file  Occupational History  . Occupation: Retired    Fish farm manager: RETIRED  Tobacco Use  . Smoking status: Never Smoker  . Smokeless tobacco: Never Used  Substance and Sexual Activity  . Alcohol use: No    Alcohol/week: 0.0 oz    Comment: rare  . Drug use: No  . Sexual activity: Not Currently  Other Topics Concern  . Not on file  Social History Narrative   married - 1951- widowed 2023/02/19. 4-daughters, 1 died leukemia; 2 sons; 10 grandchildren; 5 great-grandchildren. I-ADLs. Retired. Lives in her own home but one of her children is with her all of the time. ACP- No CPR, no prolonged mechanical ventilation; not to be sustained long term with artificial feeding or hydration; no chronic HD; no other heroic or futile.            BP (!) 166/62   Pulse 69   Ht 5\' 1"  (1.549 m)   Wt 118 lb 6.4 oz (53.7 kg)   SpO2 98%   BMI 22.37 kg/m   Physical Exam:  Well appearing 83 year old woman, NAD HEENT: Unremarkable Neck:  6 cm JVD, no thyromegally Lymphatics:  No adenopathy Back:  No CVA tenderness Lungs:  Clear,  with no wheezes, rales, or rhonchi HEART:  Regular rate rhythm, no murmurs, no rubs, no clicks Abd:  soft, positive bowel sounds, no organomegally, no rebound, no guarding Ext:  2 plus pulses, no edema, no cyanosis, no clubbing Skin:  No rashes no nodules Neuro:  CN II through XII intact, motor grossly intact  EKG - normal sinus rhythm, with left anterior fascicular block   Assess/Plan: 1. Paroxysmal atrial fibrillation - she had undergone gradual reductions in her dose of amiodarone. We have uptitrated her dose to 200 mg daily. She appears to be maintaining sinus rhythm. My plan would be to continue this dose of amiodarone and see her back in 6 months. 2. Hypertension - her systolic blood pressure is elevated today, but is been under better control in the past.  3. Remote stroke - she has minimal residual deficit. She will continue systemic anticoagulation.  Cristopher Peru, M.D.

## 2017-11-22 ENCOUNTER — Telehealth: Payer: Self-pay | Admitting: Internal Medicine

## 2017-11-22 NOTE — Telephone Encounter (Signed)
You can check and see if we have samples and okay to give her.

## 2017-11-22 NOTE — Telephone Encounter (Signed)
LVM for patient informing her that the samples are up front for her to pick up

## 2017-11-22 NOTE — Telephone Encounter (Signed)
Copied from [place] [id number]. Topic: Quick Communication - See Telephone Encounter >> Nov 22, 2017 10:30 AM Ivar Drape wrote: CRM for notification. See Telephone encounter for:  11/22/17. Patient's Xarelto refill will be late and she wanted to know if the office had any samples until her prescription arrives.  It is expected to arrive 4 - 5 days and she only has one pill left.  Her preferred pharmacy is [facility] on [address].

## 2017-11-26 ENCOUNTER — Telehealth: Payer: Self-pay | Admitting: Internal Medicine

## 2017-11-26 NOTE — Telephone Encounter (Signed)
Fine, but her cardiologist should be prescribing.

## 2017-11-26 NOTE — Telephone Encounter (Signed)
[name] from [place] [phone]) called this weekend and spoke with [employer]. Pt needed an emergency supply of Amiodarone 200 mg. They arranged for this to be called into [facility] due to extended fulfillment wait from mail order. Just FYI for documentation of call with [employer].

## 2017-11-29 ENCOUNTER — Encounter: Payer: Self-pay | Admitting: Internal Medicine

## 2017-12-17 ENCOUNTER — Other Ambulatory Visit: Payer: Self-pay | Admitting: Internal Medicine

## 2018-01-01 ENCOUNTER — Other Ambulatory Visit (INDEPENDENT_AMBULATORY_CARE_PROVIDER_SITE_OTHER): Payer: Medicare PPO

## 2018-01-01 ENCOUNTER — Ambulatory Visit (INDEPENDENT_AMBULATORY_CARE_PROVIDER_SITE_OTHER): Payer: Medicare PPO | Admitting: Internal Medicine

## 2018-01-01 ENCOUNTER — Encounter: Payer: Self-pay | Admitting: Internal Medicine

## 2018-01-01 ENCOUNTER — Ambulatory Visit (INDEPENDENT_AMBULATORY_CARE_PROVIDER_SITE_OTHER)
Admission: RE | Admit: 2018-01-01 | Discharge: 2018-01-01 | Disposition: A | Discharge status: Home / Self Care | Payer: Medicare PPO | Source: Ambulatory Visit | Attending: Internal Medicine | Admitting: Internal Medicine

## 2018-01-01 VITALS — BP 142/60 | HR 67 | Temp 97.9°F | Ht 61.0 in | Wt 116.0 lb

## 2018-01-01 DIAGNOSIS — I10 Essential (primary) hypertension: Secondary | ICD-10-CM

## 2018-01-01 DIAGNOSIS — I480 Paroxysmal atrial fibrillation: Secondary | ICD-10-CM | POA: Diagnosis not present

## 2018-01-01 DIAGNOSIS — E785 Hyperlipidemia, unspecified: Secondary | ICD-10-CM

## 2018-01-01 DIAGNOSIS — E2839 Other primary ovarian failure: Secondary | ICD-10-CM

## 2018-01-01 DIAGNOSIS — Z0000 Encounter for general adult medical examination without abnormal findings: Secondary | ICD-10-CM | POA: Diagnosis not present

## 2018-01-01 LAB — LIPID PANEL
Cholesterol: 196 mg/dL (ref 0–200)
HDL: 98.3 mg/dL (ref >39.00)
LDL Cholesterol: 81 mg/dL (ref 0–99)
NonHDL: 97.53
Total CHOL/HDL Ratio: 2
Triglycerides: 81 mg/dL (ref 0.0–149.0)
VLDL: 16.2 mg/dL (ref 0.0–40.0)

## 2018-01-01 LAB — COMPREHENSIVE METABOLIC PANEL
ALT: 23 U/L (ref 0–35)
AST: 24 U/L (ref 0–37)
Albumin: 4.2 g/dL (ref 3.5–5.2)
Alkaline Phosphatase: 60 U/L (ref 39–117)
BUN: 21 mg/dL (ref 6–23)
CO2: 26 mEq/L (ref 19–32)
Calcium: 9.7 mg/dL (ref 8.4–10.5)
Chloride: 100 mEq/L (ref 96–112)
Creatinine, Ser: 1.29 mg/dL — ABNORMAL HIGH (ref 0.40–1.20)
GFR: 41.48 mL/min — ABNORMAL LOW (ref >60.00)
Glucose, Bld: 107 mg/dL — ABNORMAL HIGH (ref 70–99)
Potassium: 3.9 mEq/L (ref 3.5–5.1)
Sodium: 136 mEq/L (ref 135–145)
Total Bilirubin: 0.4 mg/dL (ref 0.2–1.2)
Total Protein: 7.4 g/dL (ref 6.0–8.3)

## 2018-01-01 LAB — CBC
HCT: 39 % (ref 36.0–46.0)
Hemoglobin: 12.9 g/dL (ref 12.0–15.0)
MCHC: 33 g/dL (ref 30.0–36.0)
MCV: 85.7 fl (ref 78.0–100.0)
Platelets: 330 10*3/uL (ref 150.0–400.0)
RBC: 4.55 Mil/uL (ref 3.87–5.11)
RDW: 15.4 % (ref 11.5–15.5)
WBC: 9.3 10*3/uL (ref 4.0–10.5)

## 2018-01-01 LAB — TSH: TSH: 1.66 u[IU]/mL (ref 0.35–4.50)

## 2018-01-01 MED ORDER — ZOSTER VAC RECOMB ADJUVANTED 50 MCG/0.5ML IM SUSR: 0.5000 mL | Freq: Once | INTRAMUSCULAR | 1 refills | Status: AC

## 2018-01-01 NOTE — Assessment & Plan Note (Signed)
Sinus today and xarelto. HR at goal. Checking TSH and CBC today.

## 2018-01-01 NOTE — Assessment & Plan Note (Signed)
Rx for shingrix to take to pharmacy. DEXA ordered. Flu and tetanus and pneumonia up to date. Aged out of mammogram and colonoscopy. Counseled about sun safety and mole surveillance. Given 10 year screening recommendations. Counseled about amplifier for hearing and fall prevention.

## 2018-01-01 NOTE — Assessment & Plan Note (Signed)
Checking lipid panel and adjust as needed. Not on meds currently.  

## 2018-01-01 NOTE — Assessment & Plan Note (Signed)
BP at goal on amlodipine and losartan. Checking CMP and adjust as needed.

## 2018-01-01 NOTE — Progress Notes (Signed)
   Subjective:    Patient ID: [name], female    DOB: 1928/08/24, 84 y.o.   MRN: [id number]  HPI Here for medicare wellness and physical, no new complaints. Please see A/P for status and treatment of chronic medical problems.   Diet: heart healthy Physical activity: sedentary Depression/mood screen: negative Hearing: moderate loss bilaterally Visual acuity: grossly normal, performs annual eye exam  ADLs: capable Fall risk: low, cane for staibility Home safety: good Cognitive evaluation: intact to orientation, naming, recall and repetition EOL planning: adv directives discussed  I have personally reviewed and have noted 1. The patient's medical and social history - reviewed today no changes 2. Their use of alcohol, tobacco or illicit drugs 3. Their current medications and supplements 4. The patient's functional ability including ADL's, fall risks, home safety risks and hearing or visual impairment. 5. Diet and physical activities 6. Evidence for depression or mood disorders 7. Care team reviewed and updated (available in snapshot)  Review of Systems  Constitutional: Negative.   HENT: Negative.   Eyes: Negative.   Respiratory: Negative for cough, chest tightness and shortness of breath.   Cardiovascular: Negative for chest pain, palpitations and leg swelling.  Gastrointestinal: Negative for abdominal distention, abdominal pain, constipation, diarrhea, nausea and vomiting.  Musculoskeletal: Positive for arthralgias.  Skin: Negative.   Neurological: Negative.   Psychiatric/Behavioral: Negative.       Objective:   Physical Exam  Constitutional: She is oriented to person, place, and time. She appears well-developed and well-nourished.  HENT:  Head: Normocephalic and atraumatic.  Eyes: EOM are normal.  Neck: Normal range of motion.  Cardiovascular: Normal rate and regular rhythm.  Pulmonary/Chest: Effort normal and breath sounds normal. No respiratory distress. She  has no wheezes. She has no rales.  Abdominal: Soft. Bowel sounds are normal. She exhibits no distension. There is no tenderness. There is no rebound.  Musculoskeletal: She exhibits no edema.  Neurological: She is alert and oriented to person, place, and time. Coordination normal.  Skin: Skin is warm and dry.  Psychiatric: She has a normal mood and affect.   Vitals:   01/01/18 1428  BP: (!) 142/60  Pulse: 67  Temp: 97.9 F (36.6 C)  TempSrc: Oral  SpO2: 99%  Weight: 116 lb (52.6 kg)  Height: 5\' 1"  (1.549 m)      Assessment & Plan:

## 2018-01-01 NOTE — Patient Instructions (Addendum)
We have given you the prescription for the shingles vaccine to take to the pharmacy.   We are checking the blood work today.   Health Maintenance, Female Adopting a healthy lifestyle and getting preventive care can go a long way to promote health and wellness. Talk with your health care provider about what schedule of regular examinations is right for you. This is a good chance for you to check in with your provider about disease prevention and staying healthy. In between checkups, there are plenty of things you can do on your own. Experts have done a lot of research about which lifestyle changes and preventive measures are most likely to keep you healthy. Ask your health care provider for more information. Weight and diet Eat a healthy diet  Be sure to include plenty of vegetables, fruits, low-fat dairy products, and lean protein.  Do not eat a lot of foods high in solid fats, added sugars, or salt.  Get regular exercise. This is one of the most important things you can do for your health. ? Most adults should exercise for at least 150 minutes each week. The exercise should increase your heart rate and make you sweat (moderate-intensity exercise). ? Most adults should also do strengthening exercises at least twice a week. This is in addition to the moderate-intensity exercise.  Maintain a healthy weight  Body mass index (BMI) is a measurement that can be used to identify possible weight problems. It estimates body fat based on height and weight. Your health care provider can help determine your BMI and help you achieve or maintain a healthy weight.  For females 49 years of age and older: ? A BMI below 18.5 is considered underweight. ? A BMI of 18.5 to 24.9 is normal. ? A BMI of 25 to 29.9 is considered overweight. ? A BMI of 30 and above is considered obese.  Watch levels of cholesterol and blood lipids  You should start having your blood tested for lipids and cholesterol at 82 years  of age, then have this test every 5 years.  You may need to have your cholesterol levels checked more often if: ? Your lipid or cholesterol levels are high. ? You are older than 82 years of age. ? You are at high risk for heart disease.  Cancer screening Lung Cancer  Lung cancer screening is recommended for adults 60-67 years old who are at high risk for lung cancer because of a history of smoking.  A yearly low-dose CT scan of the lungs is recommended for people who: ? Currently smoke. ? Have quit within the past 15 years. ? Have at least a 30-pack-year history of smoking. A pack year is smoking an average of one pack of cigarettes a day for 1 year.  Yearly screening should continue until it has been 15 years since you quit.  Yearly screening should stop if you develop a health problem that would prevent you from having lung cancer treatment.  Breast Cancer  Practice breast self-awareness. This means understanding how your breasts normally appear and feel.  It also means doing regular breast self-exams. Let your health care provider know about any changes, no matter how small.  If you are in your 20s or 30s, you should have a clinical breast exam (CBE) by a health care provider every 1-3 years as part of a regular health exam.  If you are 74 or older, have a CBE every year. Also consider having a breast X-ray (mammogram) every year.  If you have a family history of breast cancer, talk to your health care provider about genetic screening.  If you are at high risk for breast cancer, talk to your health care provider about having an MRI and a mammogram every year.  Breast cancer gene (BRCA) assessment is recommended for women who have family members with BRCA-related cancers. BRCA-related cancers include: ? Breast. ? Ovarian. ? Tubal. ? Peritoneal cancers.  Results of the assessment will determine the need for genetic counseling and BRCA1 and BRCA2 testing.  Cervical  Cancer Your health care provider may recommend that you be screened regularly for cancer of the pelvic organs (ovaries, uterus, and vagina). This screening involves a pelvic examination, including checking for microscopic changes to the surface of your cervix (Pap test). You may be encouraged to have this screening done every 3 years, beginning at age 28.  For women ages 42-65, health care providers may recommend pelvic exams and Pap testing every 3 years, or they may recommend the Pap and pelvic exam, combined with testing for human papilloma virus (HPV), every 5 years. Some types of HPV increase your risk of cervical cancer. Testing for HPV may also be done on women of any age with unclear Pap test results.  Other health care providers may not recommend any screening for nonpregnant women who are considered low risk for pelvic cancer and who do not have symptoms. Ask your health care provider if a screening pelvic exam is right for you.  If you have had past treatment for cervical cancer or a condition that could lead to cancer, you need Pap tests and screening for cancer for at least 20 years after your treatment. If Pap tests have been discontinued, your risk factors (such as having a new sexual partner) need to be reassessed to determine if screening should resume. Some women have medical problems that increase the chance of getting cervical cancer. In these cases, your health care provider may recommend more frequent screening and Pap tests.  Colorectal Cancer  This type of cancer can be detected and often prevented.  Routine colorectal cancer screening usually begins at 82 years of age and continues through 82 years of age.  Your health care provider may recommend screening at an earlier age if you have risk factors for colon cancer.  Your health care provider may also recommend using home test kits to check for hidden blood in the stool.  A small camera at the end of a tube can be used to  examine your colon directly (sigmoidoscopy or colonoscopy). This is done to check for the earliest forms of colorectal cancer.  Routine screening usually begins at age 21.  Direct examination of the colon should be repeated every 5-10 years through 82 years of age. However, you may need to be screened more often if early forms of precancerous polyps or small growths are found.  Skin Cancer  Check your skin from head to toe regularly.  Tell your health care provider about any new moles or changes in moles, especially if there is a change in a mole's shape or color.  Also tell your health care provider if you have a mole that is larger than the size of a pencil eraser.  Always use sunscreen. Apply sunscreen liberally and repeatedly throughout the day.  Protect yourself by wearing long sleeves, pants, a wide-brimmed hat, and sunglasses whenever you are outside.  Heart disease, diabetes, and high blood pressure  High blood pressure causes heart disease and  increases the risk of stroke. High blood pressure is more likely to develop in: ? People who have blood pressure in the high end of the normal range (130-139/85-89 mm Hg). ? People who are overweight or obese. ? People who are African American.  If you are 73-54 years of age, have your blood pressure checked every 3-5 years. If you are 23 years of age or older, have your blood pressure checked every year. You should have your blood pressure measured twice-once when you are at a hospital or clinic, and once when you are not at a hospital or clinic. Record the average of the two measurements. To check your blood pressure when you are not at a hospital or clinic, you can use: ? An automated blood pressure machine at a pharmacy. ? A home blood pressure monitor.  If you are between 67 years and 20 years old, ask your health care provider if you should take aspirin to prevent strokes.  Have regular diabetes screenings. This involves taking a  blood sample to check your fasting blood sugar level. ? If you are at a normal weight and have a low risk for diabetes, have this test once every three years after 82 years of age. ? If you are overweight and have a high risk for diabetes, consider being tested at a younger age or more often. Preventing infection Hepatitis B  If you have a higher risk for hepatitis B, you should be screened for this virus. You are considered at high risk for hepatitis B if: ? You were born in a country where hepatitis B is common. Ask your health care provider which countries are considered high risk. ? Your parents were born in a high-risk country, and you have not been immunized against hepatitis B (hepatitis B vaccine). ? You have HIV or AIDS. ? You use needles to inject street drugs. ? You live with someone who has hepatitis B. ? You have had sex with someone who has hepatitis B. ? You get hemodialysis treatment. ? You take certain medicines for conditions, including cancer, organ transplantation, and autoimmune conditions.  Hepatitis C  Blood testing is recommended for: ? Everyone born from [birth year] through 1963. ? Anyone with known risk factors for hepatitis C.  Sexually transmitted infections (STIs)  You should be screened for sexually transmitted infections (STIs) including gonorrhea and chlamydia if: ? You are sexually active and are younger than 82 years of age. ? You are older than 82 years of age and your health care provider tells you that you are at risk for this type of infection. ? Your sexual activity has changed since you were last screened and you are at an increased risk for chlamydia or gonorrhea. Ask your health care provider if you are at risk.  If you do not have HIV, but are at risk, it may be recommended that you take a prescription medicine daily to prevent HIV infection. This is called pre-exposure prophylaxis (PrEP). You are considered at risk if: ? You are sexually active and  do not regularly use condoms or know the HIV status of your partner(s). ? You take drugs by injection. ? You are sexually active with a partner who has HIV.  Talk with your health care provider about whether you are at high risk of being infected with HIV. If you choose to begin PrEP, you should first be tested for HIV. You should then be tested every 3 months for as long as you are taking  PrEP. Pregnancy  If you are premenopausal and you may become pregnant, ask your health care provider about preconception counseling.  If you may become pregnant, take 400 to 800 micrograms (mcg) of folic acid every day.  If you want to prevent pregnancy, talk to your health care provider about birth control (contraception). Osteoporosis and menopause  Osteoporosis is a disease in which the bones lose minerals and strength with aging. This can result in serious bone fractures. Your risk for osteoporosis can be identified using a bone density scan.  If you are 45 years of age or older, or if you are at risk for osteoporosis and fractures, ask your health care provider if you should be screened.  Ask your health care provider whether you should take a calcium or vitamin D supplement to lower your risk for osteoporosis.  Menopause may have certain physical symptoms and risks.  Hormone replacement therapy may reduce some of these symptoms and risks. Talk to your health care provider about whether hormone replacement therapy is right for you. Follow these instructions at home:  Schedule regular health, dental, and eye exams.  Stay current with your immunizations.  Do not use any tobacco products including cigarettes, chewing tobacco, or electronic cigarettes.  If you are pregnant, do not drink alcohol.  If you are breastfeeding, limit how much and how often you drink alcohol.  Limit alcohol intake to no more than 1 drink per day for nonpregnant women. One drink equals 12 ounces of beer, 5 ounces of  wine, or 1 ounces of hard liquor.  Do not use street drugs.  Do not share needles.  Ask your health care provider for help if you need support or information about quitting drugs.  Tell your health care provider if you often feel depressed.  Tell your health care provider if you have ever been abused or do not feel safe at home. This information is not intended to replace advice given to you by your health care provider. Make sure you discuss any questions you have with your health care provider. Document Released: 06/05/2011 Document Revised: 04/27/2016 Document Reviewed: 08/24/2015 Elsevier Interactive Patient Education  [employer].

## 2018-01-11 ENCOUNTER — Telehealth: Payer: Self-pay

## 2018-01-11 NOTE — Telephone Encounter (Signed)
Copied from [place] [id number]. Topic: Quick Communication - Lab Results >> Jan 10, 2018  2:20 PM [name], CMA wrote: Notified patient regarding lab results.  Patient is to call back and let us know if she is willing to start a once weekly or monthly medication for her bones >> Jan 11, 2018  2:12 PM Aurelio Brash B wrote: Pt states she needs to talk to her daughter who is a Marine scientist and she will get back with [place]

## 2018-01-12 ENCOUNTER — Telehealth: Payer: Self-pay | Admitting: Internal Medicine

## 2018-01-18 MED ORDER — RIVAROXABAN 15 MG PO TABS: 15.0000 mg | ORAL_TABLET | Freq: Every day | ORAL | 0 refills | Status: DC

## 2018-01-18 NOTE — Telephone Encounter (Signed)
Reviewed chart pt is up-to-date sent refills to [facility]  

## 2018-01-18 NOTE — Telephone Encounter (Signed)
Pt states she has 6 pills left, she would like the refill sent to [facility].  Please advise

## 2018-01-18 NOTE — Addendum Note (Signed)
Addended by: Earnstine Regal on: 01/18/2018 11:26 AM   Modules accepted: Orders

## 2018-01-28 ENCOUNTER — Other Ambulatory Visit: Payer: Self-pay | Admitting: Internal Medicine

## 2018-03-13 ENCOUNTER — Other Ambulatory Visit: Payer: Self-pay | Admitting: Internal Medicine

## 2018-04-25 ENCOUNTER — Other Ambulatory Visit: Payer: Self-pay | Admitting: Internal Medicine

## 2018-05-01 ENCOUNTER — Ambulatory Visit: Payer: Medicare PPO | Admitting: Internal Medicine

## 2018-07-03 ENCOUNTER — Ambulatory Visit: Payer: Medicare HMO | Admitting: Internal Medicine

## 2018-07-03 ENCOUNTER — Encounter: Payer: Self-pay | Admitting: Internal Medicine

## 2018-07-03 VITALS — BP 146/62 | HR 65 | Ht 61.0 in | Wt 111.4 lb

## 2018-07-03 DIAGNOSIS — I480 Paroxysmal atrial fibrillation: Secondary | ICD-10-CM | POA: Diagnosis not present

## 2018-07-03 DIAGNOSIS — Z79899 Other long term (current) drug therapy: Secondary | ICD-10-CM | POA: Diagnosis not present

## 2018-07-03 MED ORDER — AMIODARONE HCL 200 MG PO TABS: ORAL_TABLET | ORAL | 3 refills | Status: DC

## 2018-07-03 NOTE — Patient Instructions (Addendum)
Medication Instructions:  Your physician has recommended you make the following change in your medication:  1.  Reduce your amiodarone 200 mg tablets- Take one tablet Monday through Friday daily by mouth.  Do NOT take any amiodarone on Saturday or Sunday.  Labwork: None ordered.  Testing/Procedures: None ordered.  Follow-Up: Your physician wants you to follow-up in: one year with Dr. Lovena Le.   You will receive a reminder letter in the mail two months in advance. If you don't receive a letter, please call our office to schedule the follow-up appointment.  Any Other Special Instructions Will Be Listed Below (If Applicable).  If you need a refill on your cardiac medications before your next appointment, please call your pharmacy.

## 2018-07-03 NOTE — Progress Notes (Signed)
HPI [name] returns today for ongoing evaluation and management of paroxysmal atrial fibrillation on amiodarone, remote stroke, remote SVT status post remote ablation. In addition, she has hypertension. No chest pain or shortness of breat She denies chest pain or shortness of breath. No syncope.  Allergies  Allergen Reactions  . Codeine Rash    fine rash     Current Outpatient Medications  Medication Sig Dispense Refill  . amiodarone (PACERONE) 200 MG tablet TAKE 1 TABLET EVERY DAY 90 tablet 3  . amLODipine (NORVASC) 10 MG tablet TAKE 1 TABLET (10 MG TOTAL) BY MOUTH DAILY. 90 tablet 2  . losartan (COZAAR) 100 MG tablet TAKE 1 TABLET (100 MG TOTAL) BY MOUTH DAILY. 90 tablet 2  . nitroGLYCERIN (NITROSTAT) 0.4 MG SL tablet Place 1 tablet (0.4 mg total) under the tongue every 5 (five) minutes as needed for chest pain (MAX 3 TABLETS). 75 tablet 1  . polyethylene glycol (MIRALAX / GLYCOLAX) packet Take 17 g by mouth daily as needed (constipation).     Alveda Reasons 15 MG TABS tablet TAKE 1 TABLET EVERY DAY 90 tablet 2   No current facility-administered medications for this visit.      Past Medical History:  Diagnosis Date  . Atrial fibrillation ([place])   . Cervical arthritis   . COLONIC POLYPS, HX OF 09/24/2007  . H/O: hysterectomy    fibroid tumors  . Hip arthritis   . History of colonic polyps   . History of stroke   . HTN (hypertension)   . Hyperlipidemia   . Stroke ([place]) 02/2010  . SUPRAVENTRICULAR ARRHYTHMIA 12/20/2010   s/p RFCA 05/2010-Dr. Lovena Le    ROS:   All systems reviewed and negative except as noted in the HPI.   Past Surgical History:  Procedure Laterality Date  . ABDOMINAL HYSTERECTOMY    . BREAST LUMPECTOMY     left  . COLON SURGERY  03/19/2009   colon cancer   . FB removal from popliteal fossae    . INTRACAPSULAR CATARACT EXTRACTION      Left eye Oct 18, 2009  right eye Jan '11 By Dr Era Skeen  . PILONIDAL CYST EXCISION    . TRANSTHORACIC  ECHOCARDIOGRAM  2007     Family History  Problem Relation Age of Onset  . Heart attack Mother   . Stroke Mother   . Coronary artery disease Mother   . Heart disease Mother   . Cancer Father        Colon Cancer  . Diabetes Daughter   . Cancer Daughter        Leukemia     Social History   Socioeconomic History  . Marital status: Widowed    Spouse name: Not on file  . Number of children: 6  . Years of education: Not on file  . Highest education level: Not on file  Occupational History  . Occupation: Retired    Fish farm manager: RETIRED  Social Needs  . Financial resource strain: Not on file  . Food insecurity:    Worry: Not on file    Inability: Not on file  . Transportation needs:    Medical: Not on file    Non-medical: Not on file  Tobacco Use  . Smoking status: Never Smoker  . Smokeless tobacco: Never Used  Substance and Sexual Activity  . Alcohol use: No    Alcohol/week: 0.0 oz    Comment: rare  . Drug use: No  . Sexual activity:  Not Currently  Lifestyle  . Physical activity:    Days per week: Not on file    Minutes per session: Not on file  . Stress: Not on file  Relationships  . Social connections:    Talks on phone: Not on file    Gets together: Not on file    Attends religious service: Not on file    Active member of club or organization: Not on file    Attends meetings of clubs or organizations: Not on file    Relationship status: Not on file  . Intimate partner violence:    Fear of current or ex partner: Not on file    Emotionally abused: Not on file    Physically abused: Not on file    Forced sexual activity: Not on file  Other Topics Concern  . Not on file  Social History Narrative   married - 1951- widowed 02-21-2023. 4-daughters, 1 died leukemia; 2 sons; 10 grandchildren; 5 great-grandchildren. I-ADLs. Retired. Lives in her own home but one of her children is with her all of the time. ACP- No CPR, no prolonged mechanical ventilation; not to be sustained  long term with artificial feeding or hydration; no chronic HD; no other heroic or futile.            BP (!) 146/62   Pulse 65   Ht 5\' 1"  (1.549 m)   Wt 111 lb 6.4 oz (50.5 kg)   SpO2 96%   BMI 21.05 kg/m   Physical Exam:  Well appearing NAD HEENT: Unremarkable Neck:  No JVD, no thyromegally Lymphatics:  No adenopathy Back:  No CVA tenderness Lungs:  Clear HEART:  Regular rate rhythm, no murmurs, no rubs, no clicks Abd:  soft, positive bowel sounds, no organomegally, no rebound, no guarding Ext:  2 plus pulses, no edema, no cyanosis, no clubbing Skin:  No rashes no nodules Neuro:  CN II through XII intact, motor grossly intact  EKG - nsr  Assess/Plan: 1. PAF - she is maintaining NSR. She will reduce her dose of amio to 200 mg mon-Fri and none on Sat/Sunday. 2. Stroke - she will continue systemic anti-coagulation. 3. HTN - her blood pressure is welll controlled.   Mikle Bosworth.D.

## 2018-07-22 ENCOUNTER — Telehealth: Payer: Self-pay | Admitting: Internal Medicine

## 2018-07-22 NOTE — Telephone Encounter (Signed)
Patient came by the office and dropped off an Application for Renewal of Disability Parking Placard. Patient would like this mailed to her once it is completed at [place], [place] [address].  Form has been placed in Brittany's box for completion.

## 2018-07-22 NOTE — Telephone Encounter (Signed)
Forms have been completed & placed in providers box to review the renewal and sign.

## 2018-07-23 NOTE — Telephone Encounter (Signed)
Form has been mailed

## 2018-07-23 NOTE — Telephone Encounter (Signed)
Signed, Copy sent to scan.   LVM to inform patient the form is ready to be picked up.

## 2018-07-23 NOTE — Telephone Encounter (Signed)
Pt would like to see if the form can be mailed to her.

## 2018-08-23 DIAGNOSIS — C44619 Basal cell carcinoma of skin of left upper limb, including shoulder: Secondary | ICD-10-CM | POA: Diagnosis not present

## 2018-08-23 DIAGNOSIS — D692 Other nonthrombocytopenic purpura: Secondary | ICD-10-CM | POA: Diagnosis not present

## 2018-08-23 DIAGNOSIS — D485 Neoplasm of uncertain behavior of skin: Secondary | ICD-10-CM | POA: Diagnosis not present

## 2018-08-23 DIAGNOSIS — L728 Other follicular cysts of the skin and subcutaneous tissue: Secondary | ICD-10-CM | POA: Diagnosis not present

## 2018-08-23 DIAGNOSIS — L814 Other melanin hyperpigmentation: Secondary | ICD-10-CM | POA: Diagnosis not present

## 2018-08-23 DIAGNOSIS — L578 Other skin changes due to chronic exposure to nonionizing radiation: Secondary | ICD-10-CM | POA: Diagnosis not present

## 2018-08-30 ENCOUNTER — Encounter (HOSPITAL_COMMUNITY): Payer: Self-pay | Admitting: Emergency Medicine

## 2018-08-30 ENCOUNTER — Emergency Department (HOSPITAL_COMMUNITY): Payer: Medicare HMO

## 2018-08-30 ENCOUNTER — Inpatient Hospital Stay (HOSPITAL_COMMUNITY)
Admission: EM | Admit: 2018-08-30 | Discharge: 2018-09-01 | DRG: 470 | Disposition: A | Discharge status: Home Health | Payer: Medicare HMO | Attending: Internal Medicine | Admitting: Internal Medicine

## 2018-08-30 ENCOUNTER — Other Ambulatory Visit: Payer: Self-pay

## 2018-08-30 DIAGNOSIS — R079 Chest pain, unspecified: Secondary | ICD-10-CM | POA: Diagnosis not present

## 2018-08-30 DIAGNOSIS — E782 Mixed hyperlipidemia: Secondary | ICD-10-CM

## 2018-08-30 DIAGNOSIS — W19XXXA Unspecified fall, initial encounter: Secondary | ICD-10-CM | POA: Diagnosis not present

## 2018-08-30 DIAGNOSIS — I482 Chronic atrial fibrillation: Secondary | ICD-10-CM | POA: Diagnosis not present

## 2018-08-30 DIAGNOSIS — W010XXA Fall on same level from slipping, tripping and stumbling without subsequent striking against object, initial encounter: Secondary | ICD-10-CM | POA: Diagnosis present

## 2018-08-30 DIAGNOSIS — I4891 Unspecified atrial fibrillation: Secondary | ICD-10-CM | POA: Diagnosis not present

## 2018-08-30 DIAGNOSIS — Z8249 Family history of ischemic heart disease and other diseases of the circulatory system: Secondary | ICD-10-CM | POA: Diagnosis not present

## 2018-08-30 DIAGNOSIS — Y92007 Garden or yard of unspecified non-institutional (private) residence as the place of occurrence of the external cause: Secondary | ICD-10-CM | POA: Diagnosis not present

## 2018-08-30 DIAGNOSIS — Z885 Allergy status to narcotic agent status: Secondary | ICD-10-CM

## 2018-08-30 DIAGNOSIS — S72001A Fracture of unspecified part of neck of right femur, initial encounter for closed fracture: Secondary | ICD-10-CM

## 2018-08-30 DIAGNOSIS — S72011D Unspecified intracapsular fracture of right femur, subsequent encounter for closed fracture with routine healing: Secondary | ICD-10-CM

## 2018-08-30 DIAGNOSIS — Z86010 Personal history of colonic polyps: Secondary | ICD-10-CM

## 2018-08-30 DIAGNOSIS — Z85038 Personal history of other malignant neoplasm of large intestine: Secondary | ICD-10-CM

## 2018-08-30 DIAGNOSIS — I10 Essential (primary) hypertension: Secondary | ICD-10-CM | POA: Diagnosis present

## 2018-08-30 DIAGNOSIS — Z9841 Cataract extraction status, right eye: Secondary | ICD-10-CM

## 2018-08-30 DIAGNOSIS — Z8673 Personal history of transient ischemic attack (TIA), and cerebral infarction without residual deficits: Secondary | ICD-10-CM

## 2018-08-30 DIAGNOSIS — Z833 Family history of diabetes mellitus: Secondary | ICD-10-CM

## 2018-08-30 DIAGNOSIS — N179 Acute kidney failure, unspecified: Secondary | ICD-10-CM | POA: Diagnosis present

## 2018-08-30 DIAGNOSIS — R51 Headache: Secondary | ICD-10-CM | POA: Diagnosis not present

## 2018-08-30 DIAGNOSIS — Z9842 Cataract extraction status, left eye: Secondary | ICD-10-CM

## 2018-08-30 DIAGNOSIS — Y9301 Activity, walking, marching and hiking: Secondary | ICD-10-CM | POA: Diagnosis present

## 2018-08-30 DIAGNOSIS — W1831XA Fall on same level due to stepping on an object, initial encounter: Secondary | ICD-10-CM | POA: Diagnosis present

## 2018-08-30 DIAGNOSIS — E785 Hyperlipidemia, unspecified: Secondary | ICD-10-CM | POA: Diagnosis not present

## 2018-08-30 DIAGNOSIS — E871 Hypo-osmolality and hyponatremia: Secondary | ICD-10-CM | POA: Diagnosis present

## 2018-08-30 DIAGNOSIS — Z823 Family history of stroke: Secondary | ICD-10-CM | POA: Diagnosis not present

## 2018-08-30 DIAGNOSIS — Z7901 Long term (current) use of anticoagulants: Secondary | ICD-10-CM

## 2018-08-30 DIAGNOSIS — K5900 Constipation, unspecified: Secondary | ICD-10-CM | POA: Diagnosis present

## 2018-08-30 DIAGNOSIS — Z96641 Presence of right artificial hip joint: Secondary | ICD-10-CM | POA: Diagnosis not present

## 2018-08-30 DIAGNOSIS — S72011A Unspecified intracapsular fracture of right femur, initial encounter for closed fracture: Secondary | ICD-10-CM

## 2018-08-30 DIAGNOSIS — Z96649 Presence of unspecified artificial hip joint: Secondary | ICD-10-CM

## 2018-08-30 DIAGNOSIS — M25551 Pain in right hip: Secondary | ICD-10-CM

## 2018-08-30 DIAGNOSIS — M8580 Other specified disorders of bone density and structure, unspecified site: Secondary | ICD-10-CM | POA: Diagnosis present

## 2018-08-30 DIAGNOSIS — Z90710 Acquired absence of both cervix and uterus: Secondary | ICD-10-CM | POA: Diagnosis not present

## 2018-08-30 DIAGNOSIS — S7291XA Unspecified fracture of right femur, initial encounter for closed fracture: Secondary | ICD-10-CM | POA: Diagnosis present

## 2018-08-30 DIAGNOSIS — S299XXA Unspecified injury of thorax, initial encounter: Secondary | ICD-10-CM | POA: Diagnosis not present

## 2018-08-30 DIAGNOSIS — R0902 Hypoxemia: Secondary | ICD-10-CM | POA: Diagnosis not present

## 2018-08-30 DIAGNOSIS — Z471 Aftercare following joint replacement surgery: Secondary | ICD-10-CM | POA: Diagnosis not present

## 2018-08-30 DIAGNOSIS — Z23 Encounter for immunization: Secondary | ICD-10-CM

## 2018-08-30 LAB — URINALYSIS, ROUTINE W REFLEX MICROSCOPIC
Bilirubin Urine: NEGATIVE
Glucose, UA: NEGATIVE mg/dL
Hgb urine dipstick: NEGATIVE
Ketones, ur: NEGATIVE mg/dL
Nitrite: POSITIVE — AB
Protein, ur: NEGATIVE mg/dL
Specific Gravity, Urine: 1.008 (ref 1.005–1.030)
pH: 7 (ref 5.0–8.0)

## 2018-08-30 LAB — CBC WITH DIFFERENTIAL/PLATELET
Abs Immature Granulocytes: 0.1 10*3/uL (ref 0.0–0.1)
Basophils Absolute: 0.1 10*3/uL (ref 0.0–0.1)
Basophils Relative: 1 %
Eosinophils Absolute: 0 10*3/uL (ref 0.0–0.7)
Eosinophils Relative: 0 %
HCT: 37.3 % (ref 36.0–46.0)
Hemoglobin: 12 g/dL (ref 12.0–15.0)
Immature Granulocytes: 1 %
Lymphocytes Relative: 9 %
Lymphs Abs: 1 10*3/uL (ref 0.7–4.0)
MCH: 28.2 pg (ref 26.0–34.0)
MCHC: 32.2 g/dL (ref 30.0–36.0)
MCV: 87.8 fL (ref 78.0–100.0)
Monocytes Absolute: 0.7 10*3/uL (ref 0.1–1.0)
Monocytes Relative: 7 %
Neutro Abs: 8.9 10*3/uL — ABNORMAL HIGH (ref 1.7–7.7)
Neutrophils Relative %: 82 %
Platelets: 218 10*3/uL (ref 150–400)
RBC: 4.25 MIL/uL (ref 3.87–5.11)
RDW: 15.4 % (ref 11.5–15.5)
WBC: 10.7 10*3/uL — ABNORMAL HIGH (ref 4.0–10.5)

## 2018-08-30 LAB — COMPREHENSIVE METABOLIC PANEL
ALT: 25 U/L (ref 0–44)
AST: 34 U/L (ref 15–41)
Albumin: 3.8 g/dL (ref 3.5–5.0)
Alkaline Phosphatase: 52 U/L (ref 38–126)
Anion gap: 10 (ref 5–15)
BUN: 19 mg/dL (ref 8–23)
CO2: 21 mmol/L — ABNORMAL LOW (ref 22–32)
Calcium: 9.2 mg/dL (ref 8.9–10.3)
Chloride: 103 mmol/L (ref 98–111)
Creatinine, Ser: 1.28 mg/dL — ABNORMAL HIGH (ref 0.44–1.00)
GFR calc Af Amer: 42 mL/min — ABNORMAL LOW (ref >60)
GFR calc non Af Amer: 36 mL/min — ABNORMAL LOW (ref >60)
Glucose, Bld: 119 mg/dL — ABNORMAL HIGH (ref 70–99)
Potassium: 4.6 mmol/L (ref 3.5–5.1)
Sodium: 134 mmol/L — ABNORMAL LOW (ref 135–145)
Total Bilirubin: 0.7 mg/dL (ref 0.3–1.2)
Total Protein: 6.3 g/dL — ABNORMAL LOW (ref 6.5–8.1)

## 2018-08-30 MED ORDER — FENTANYL CITRATE (PF) 100 MCG/2ML IJ SOLN
50.0000 ug | Freq: Once | INTRAMUSCULAR | Status: AC
  Administered 2018-08-30: 50 ug via INTRAVENOUS
  Filled 2018-08-30: qty 2

## 2018-08-30 NOTE — ED Notes (Signed)
ED Provider at bedside. 

## 2018-08-30 NOTE — H&P (Signed)
History and Physical   DELIAH STREHLOW RDE:[id number] DOB: 04/02/1928 DOA: 08/30/2018  Referring MD/NP/PA: Dr. Billy Fischer  PCP: Hoyt Koch, MD   Outpatient Specialists: Dr. Crissie Sickles  Patient coming from: Home  Chief Complaint: Fall with right hip pain  HPI: [name] is a 84 y.o. female with medical history significant of atrial fibrillation, hypertension, previous CVA who was in her yard today feeding fish and her pound when she stepped on a rock and fell backwards onto her right side.  Patient did not hit her head.  She felt pain and was unable to get up.  Pain is in the right hip area.  She was seen in the ER and found to have right femoral neck fracture.  Patient has been on chronic anticoagulation with Xarelto.  Orthopedics has been consulted and patient is being admitted to medical service for pre-operative clearance.  She is currently stable fully awake and alert and no complaint.  Pain has been relieved with medication in the ER.  ED Course: Blood pressure is 157/68, pulse 77 and respiratory rate of 20.  White count 10.7, sodium 134, potassium 4.6 CO2 21 with creatinine 1.28.  Glucose is 119.  Hemoglobin is 12.  Head CT without contrast is negative, chest x-ray showed no active disease, x-ray of the right hip showed Subcapital oblique RIGHT hip fracture with slight foreshortening.  Review of Systems: As per HPI otherwise 10 point review of systems negative.    Past Medical History:  Diagnosis Date  . Atrial fibrillation ([place])   . Cervical arthritis   . COLONIC POLYPS, HX OF 09/24/2007  . H/O: hysterectomy    fibroid tumors  . Hip arthritis   . History of colonic polyps   . History of stroke   . HTN (hypertension)   . Hyperlipidemia   . Stroke ([place]) 02/2010  . SUPRAVENTRICULAR ARRHYTHMIA 12/20/2010   s/p RFCA 05/2010-Dr. Lovena Le    Past Surgical History:  Procedure Laterality Date  . ABDOMINAL HYSTERECTOMY    . BREAST LUMPECTOMY     left  . COLON  SURGERY  03/19/2009   colon cancer   . FB removal from popliteal fossae    . INTRACAPSULAR CATARACT EXTRACTION      Left eye Oct 18, 2009  right eye Jan '11 By Dr Era Skeen  . PILONIDAL CYST EXCISION    . TRANSTHORACIC ECHOCARDIOGRAM  2007     reports that she has never smoked. She has never used smokeless tobacco. She reports that she does not drink alcohol or use drugs.  Allergies  Allergen Reactions  . Codeine Rash    fine rash    Family History  Problem Relation Age of Onset  . Heart attack Mother   . Stroke Mother   . Coronary artery disease Mother   . Heart disease Mother   . Cancer Father        Colon Cancer  . Diabetes Daughter   . Cancer Daughter        Leukemia     Prior to Admission medications   Medication Sig Start Date End Date Taking? Authorizing Provider  amiodarone (PACERONE) 200 MG tablet Take one tablet by mouth Monday, Tuesday, Wednesday, Thursday and Friday.  Do not take on Saturday or Sunday. 07/03/18   Evans Lance, MD  amLODipine (NORVASC) 10 MG tablet TAKE 1 TABLET (10 MG TOTAL) BY MOUTH DAILY. 03/13/18   Hoyt Koch, MD  losartan (COZAAR) 100 MG tablet TAKE 1  TABLET (100 MG TOTAL) BY MOUTH DAILY. 04/25/18   Hoyt Koch, MD  nitroGLYCERIN (NITROSTAT) 0.4 MG SL tablet Place 1 tablet (0.4 mg total) under the tongue every 5 (five) minutes as needed for chest pain (MAX 3 TABLETS). 03/30/17   Baldwin Jamaica, PA-C  polyethylene glycol [facility] Floria Raveling) packet Take 17 g by mouth daily as needed (constipation).     [provider]  XARELTO 15 MG TABS tablet TAKE 1 TABLET EVERY DAY 04/25/18   Hoyt Koch, MD    Physical Exam: Vitals:   08/30/18 1934 08/30/18 1935 08/30/18 2015 08/30/18 2100  BP: (!) 155/69  (!) 157/65 134/72  Pulse: 77   77  Resp: 20   16  Temp: 98 F (36.7 C)     TempSrc: Oral     SpO2: 100%   98%  Weight:  50.8 kg    Height:  4' 11.5" (1.511 m)        Constitutional: NAD, calm,  comfortable Vitals:   08/30/18 1934 08/30/18 1935 08/30/18 2015 08/30/18 2100  BP: (!) 155/69  (!) 157/65 134/72  Pulse: 77   77  Resp: 20   16  Temp: 98 F (36.7 C)     TempSrc: Oral     SpO2: 100%   98%  Weight:  50.8 kg    Height:  4' 11.5" (1.511 m)     Eyes: PERRL, lids and conjunctivae normal ENMT: Mucous membranes are moist. Posterior pharynx clear of any exudate or lesions.Normal dentition.  Neck: normal, supple, no masses, no thyromegaly Respiratory: clear to auscultation bilaterally, no wheezing, no crackles. Normal respiratory effort. No accessory muscle use.  Cardiovascular: Irregularly irregular rate and rhythm, no murmurs / rubs / gallops. No extremity edema. 2+ pedal pulses. No carotid bruits.  Abdomen: no tenderness, no masses palpated. No hepatosplenomegaly. Bowel sounds positive.  Musculoskeletal: no clubbing / cyanosis. No joint deformity upper and lower extremities. Shortening Right LE with angulation. Normal muscle tone.  Skin: no rashes, lesions, ulcers. No induration Neurologic: CN 2-12 grossly intact. Sensation intact, DTR normal. Strength 5/5 in all 4.  Psychiatric: Normal judgment and insight. Alert and oriented x 3. Normal mood.     Labs on Admission: I have personally reviewed following labs and imaging studies  CBC: Recent Labs  Lab 08/30/18 2107  WBC 10.7*  NEUTROABS 8.9*  HGB 12.0  HCT 37.3  MCV 87.8  PLT 098   Basic Metabolic Panel: Recent Labs  Lab 08/30/18 2107  NA 134*  K 4.6  CL 103  CO2 21*  GLUCOSE 119*  BUN 19  CREATININE 1.28*  CALCIUM 9.2   GFR: Estimated Creatinine Clearance: 21.3 mL/min (A) (by C-G formula based on SCr of 1.28 mg/dL (H)). Liver Function Tests: Recent Labs  Lab 08/30/18 2107  AST 34  ALT 25  ALKPHOS 52  BILITOT 0.7  PROT 6.3*  ALBUMIN 3.8   No results for input(s): LIPASE, AMYLASE in the last 168 hours. No results for input(s): AMMONIA in the last 168 hours. Coagulation Profile: No results  for input(s): INR, PROTIME in the last 168 hours. Cardiac Enzymes: No results for input(s): CKTOTAL, CKMB, CKMBINDEX, TROPONINI in the last 168 hours. BNP (last 3 results) No results for input(s): PROBNP in the last 8760 hours. HbA1C: No results for input(s): HGBA1C in the last 72 hours. CBG: No results for input(s): GLUCAP in the last 168 hours. Lipid Profile: No results for input(s): CHOL, HDL, LDLCALC, TRIG, CHOLHDL, LDLDIRECT  in the last 72 hours. Thyroid Function Tests: No results for input(s): TSH, T4TOTAL, FREET4, T3FREE, THYROIDAB in the last 72 hours. Anemia Panel: No results for input(s): VITAMINB12, FOLATE, FERRITIN, TIBC, IRON, RETICCTPCT in the last 72 hours. Urine analysis:    Component Value Date/Time   COLORURINE YELLOW 03/13/2017 [place] 03/13/2017 0438   LABSPEC 1.011 03/13/2017 0438   PHURINE 5.0 03/13/2017 0438   GLUCOSEU NEGATIVE 03/13/2017 0438   HGBUR NEGATIVE 03/13/2017 0438   BILIRUBINUR NEGATIVE 03/13/2017 [place] 03/13/2017 0438   PROTEINUR NEGATIVE 03/13/2017 0438   UROBILINOGEN 1.0 10/24/2014 1115   NITRITE NEGATIVE 03/13/2017 0438   LEUKOCYTESUR TRACE (A) 03/13/2017 0438   Sepsis Labs: @LABRCNTIP (procalcitonin:4,lacticidven:4) )No results found for this or any previous visit (from the past 240 hour(s)).   Radiological Exams on Admission: Dg Chest 1 View  Result Date: 08/30/2018 CLINICAL DATA:  Pain after a fall.  Positive for hip fracture. EXAM: CHEST  1 VIEW COMPARISON:  Chest radiograph 03/12/2017. FINDINGS: The heart is enlarged. There is mild vascular congestion without consolidation or edema. Thoracic atherosclerosis. Low lung volumes. Osteopenia. IMPRESSION: Cardiomegaly with mild vascular congestion. No consolidation or edema.Worsening aeration from priors. Electronically Signed   By: Staci Righter M.D.   On: 08/30/2018 20:29   Ct Head Wo Contrast  Result Date: 08/30/2018 CLINICAL DATA:  Headache,  posttraumatic EXAM: CT HEAD WITHOUT CONTRAST TECHNIQUE: Contiguous axial images were obtained from the base of the skull through the vertex without intravenous contrast. COMPARISON:  10/07/2015 FINDINGS: Brain: Atrophy with chronic left MCA distribution encephalomalacia from remote infarct involving the left frontotemporal lobes. Left basal ganglial lacunar infarct no acute intracranial hemorrhage, midline shift or edema. Midline fourth ventricle and basal cisterns without effacement. No intra-axial mass nor extra-axial fluid collections. Mild ex vacuo dilatation of the adjacent left lateral ventricle. Mild chronic small vessel ischemic disease otherwise noted. Vascular: No hyperdense vessel or unexpected calcification. Skull: Normal. Negative for fracture or focal lesion. Sinuses/Orbits: No acute finding. Status post bilateral lens implants. Other: None. IMPRESSION: Left MCA distribution infarct without cephalization ex vacuo dilatation of the left lateral ventricle as before. No acute intracranial abnormality. Chronic small vessel ischemic disease. Electronically Signed   By: Ashley Royalty M.D.   On: 08/30/2018 20:17   Dg Hip Unilat  With Pelvis 2-3 Views Right  Result Date: 08/30/2018 CLINICAL DATA:  Pain after a fall. EXAM: DG HIP (WITH OR WITHOUT PELVIS) 2-3V RIGHT COMPARISON:  None. FINDINGS: Skeletal osteopenia. Subcapital oblique RIGHT hip fracture, with slight foreshortening. Pelvic bones are intact. Lumbar degenerative change. IMPRESSION: Subcapital oblique RIGHT hip fracture with slight foreshortening. Electronically Signed   By: Staci Righter M.D.   On: 08/30/2018 20:28     Assessment/Plan Principal Problem:   Closed right femoral fracture (HCC) Active Problems:   Hyperlipidemia   Essential hypertension   Atrial fibrillation ([facility])   Hyponatremia   Acute kidney injury ([place])     #1 right femoral neck fracture: Patient will be admitted to medical floor.  We will hold Xarelto.  Prepare  patient for possible surgery.  Perioperative beta-blocker will be recommended.  We will obtain EKG and possibly echocardiogram.  Hemodynamically patient appears stable otherwise for surgical reduction.  #2 atrial fibrillation: Patient on amiodarone as well as anticoagulation.  Holding Xarelto.  We will heparin for DVT prophylaxis in the interim.  #3 hypertension: Blood pressure appears controlled.  Continue current regimen.  #4 hyponatremia: Possibly mild dehydration.  Saline infusion  for now.  #5 acute kidney injury: BUN/creatinine is slightly elevated.  Continue with saline and recheck creatinine in the morning.  #6 hyperlipidemia: Home regimen will be maintained.  #7 osteopenia: Patient had bone density studies back in January.  Will need to be on calcium and vitamin D.   DVT prophylaxis: Heparin Code Status: Full code Family Communication: Daughter and grandchildren both in the room Disposition Plan: To be determined Consults called: Dr. Percell Miller orthopedics Admission status: Inpatient  Severity of Illness: The appropriate patient status for this patient is INPATIENT. Inpatient status is judged to be reasonable and necessary in order to provide the required intensity of service to ensure the patient's safety. The patient's presenting symptoms, physical exam findings, and initial radiographic and laboratory data in the context of their chronic comorbidities is felt to place them at high risk for further clinical deterioration. Furthermore, it is not anticipated that the patient will be medically stable for discharge from the hospital within 2 midnights of admission. The following factors support the patient status of inpatient.   " The patient's presenting symptoms include fall with right hip fracture. " The worrisome physical exam findings include angulation of the right lower extremity. " The initial radiographic and laboratory data are worrisome because of x-ray showing fracture of the  femoral neck. " The chronic co-morbidities include atrial fibrillation with chronic anticoagulation.   * I certify that at the point of admission it is my clinical judgment that the patient will require inpatient hospital care spanning beyond 2 midnights from the point of admission due to high intensity of service, high risk for further deterioration and high frequency of surveillance required.Barbette Merino MD [facility] Pager [phone]  If 7PM-7AM, please contact night-coverage [email or web address] Password [facility]  08/30/2018, 10:48 PM

## 2018-08-30 NOTE — ED Provider Notes (Signed)
[place] EMERGENCY DEPARTMENT Provider Note   CSN: [id number] Arrival date & time: 08/30/18  1922     History   Chief Complaint Chief Complaint  Patient presents with  . Fall  . Hip Pain    HPI [name] is a 84 y.o. female.  HPI   84 year old female with a history of atrial fibrillation on Xarelto presents with concern for mechanical fall with right hip pain.  Patient reports she was walking, and lost her balance and fell onto the grass.  Denies hitting her head.  Denies any other injuries, no neck pain, back pain, chest pain, abdominal pain, nausea, vomiting, shortness of breath, numbness or weakness.  Reports right hip pain which is worsened with movement, improved with rest, improved with medication with EMS.  Past Medical History:  Diagnosis Date  . Atrial fibrillation ([place])   . Cervical arthritis   . COLONIC POLYPS, HX OF 09/24/2007  . H/O: hysterectomy    fibroid tumors  . Hip arthritis   . History of colonic polyps   . History of stroke   . HTN (hypertension)   . Hyperlipidemia   . Stroke ([place]) 02/2010  . SUPRAVENTRICULAR ARRHYTHMIA 12/20/2010   s/p RFCA 05/2010-Dr. Lovena Le    Patient Active Problem List   Diagnosis Date Noted  . Closed right femoral fracture ([place]) 08/30/2018  . Hyponatremia 08/30/2018  . Acute kidney injury ([place]) 08/30/2018  . Routine health maintenance 12/01/2011  . Stroke ([place]) 03/09/2011  . Atrial fibrillation ([place]) 03/09/2011  . Hyperlipidemia 10/09/2008  . Essential hypertension 09/24/2007    Past Surgical History:  Procedure Laterality Date  . ABDOMINAL HYSTERECTOMY    . BREAST LUMPECTOMY     left  . COLON SURGERY  03/19/2009   colon cancer   . FB removal from popliteal fossae    . INTRACAPSULAR CATARACT EXTRACTION      Left eye Oct 18, 2009  right eye Jan '11 By Dr Era Skeen  . PILONIDAL CYST EXCISION    . TRANSTHORACIC ECHOCARDIOGRAM  2007     OB History   None      Home Medications      Prior to Admission medications   Medication Sig Start Date End Date Taking? Authorizing Provider  amiodarone (PACERONE) 200 MG tablet Take one tablet by mouth Monday, Tuesday, Wednesday, Thursday and Friday.  Do not take on Saturday or Sunday. 07/03/18  Yes Evans Lance, MD  amLODipine (NORVASC) 10 MG tablet TAKE 1 TABLET (10 MG TOTAL) BY MOUTH DAILY. 03/13/18  Yes Hoyt Koch, MD  calcium-vitamin D (OSCAL WITH D) 500-200 MG-UNIT tablet Take 1 tablet by mouth every morning.   Yes [provider]  fluorouracil (EFUDEX) 5 % cream Apply 1 application topically 2 (two) times daily.   Yes [provider]  losartan (COZAAR) 100 MG tablet TAKE 1 TABLET (100 MG TOTAL) BY MOUTH DAILY. 04/25/18  Yes Hoyt Koch, MD  Multiple Vitamin (MULTIVITAMIN WITH MINERALS) TABS tablet Take 1 tablet by mouth 2 (two) times daily.   Yes [provider]  nitroGLYCERIN (NITROSTAT) 0.4 MG SL tablet Place 1 tablet (0.4 mg total) under the tongue every 5 (five) minutes as needed for chest pain (MAX 3 TABLETS). 03/30/17  Yes Baldwin Jamaica, PA-C  polyethylene glycol [facility] / GLYCOLAX) packet Take 17 g by mouth daily as needed (constipation).    Yes [provider]  XARELTO 15 MG TABS tablet TAKE 1 TABLET EVERY DAY 04/25/18  Yes Hoyt Koch, MD    Family History Family History  Problem Relation Age of Onset  . Heart attack Mother   . Stroke Mother   . Coronary artery disease Mother   . Heart disease Mother   . Cancer Father        Colon Cancer  . Diabetes Daughter   . Cancer Daughter        Leukemia    Social History Social History   Tobacco Use  . Smoking status: Never Smoker  . Smokeless tobacco: Never Used  Substance Use Topics  . Alcohol use: No    Alcohol/week: 0.0 standard drinks    Comment: rare  . Drug use: No     Allergies   Codeine   Review of Systems Review of Systems  Constitutional: Negative for fever.  HENT:  Negative for sore throat.   Eyes: Negative for visual disturbance.  Respiratory: Negative for cough and shortness of breath.   Cardiovascular: Negative for chest pain.  Gastrointestinal: Negative for abdominal pain, nausea and vomiting.  Genitourinary: Negative for difficulty urinating.  Musculoskeletal: Positive for arthralgias. Negative for back pain and neck pain.  Skin: Negative for rash.  Neurological: Negative for syncope and headaches.     Physical Exam Updated Vital Signs BP (!) 140/56 (BP Location: Right Arm)   Pulse 67   Temp 98.8 F (37.1 C) (Oral)   Resp 16   Ht 4' 11.5" (1.511 m)   Wt 50.8 kg   SpO2 99%   BMI 22.24 kg/m   Physical Exam  Constitutional: She is oriented to person, place, and time. She appears well-developed and well-nourished. No distress.  HENT:  Head: Normocephalic and atraumatic.  Eyes: Conjunctivae and EOM are normal.  Neck: Normal range of motion.  Cardiovascular: Normal rate, regular rhythm, normal heart sounds and intact distal pulses. Exam reveals no gallop and no friction rub.  No murmur heard. Pulmonary/Chest: Effort normal and breath sounds normal. No respiratory distress. She has no wheezes. She has no rales.  Abdominal: Soft. She exhibits no distension. There is no tenderness. There is no guarding.  Musculoskeletal: She exhibits no edema or tenderness.  Right leg shortened, internally rotated.  Neurological: She is alert and oriented to person, place, and time.  Skin: Skin is warm and dry. No rash noted. She is not diaphoretic. No erythema.  Nursing note and vitals reviewed.    ED Treatments / Results  Labs (all labs ordered are listed, but only abnormal results are displayed) Labs Reviewed  CBC WITH DIFFERENTIAL/PLATELET - Abnormal; Notable for the following components:      Result Value   WBC 10.7 (*)    Neutro Abs 8.9 (*)    All other components within normal limits  COMPREHENSIVE METABOLIC PANEL - Abnormal; Notable for  the following components:   Sodium 134 (*)    CO2 21 (*)    Glucose, Bld 119 (*)    Creatinine, Ser 1.28 (*)    Total Protein 6.3 (*)    GFR calc non Af Amer 36 (*)    GFR calc Af Amer 42 (*)    All other components within normal limits  URINALYSIS, ROUTINE W REFLEX MICROSCOPIC - Abnormal; Notable for the following components:   Nitrite POSITIVE (*)    Leukocytes, UA TRACE (*)    Bacteria, UA RARE (*)    All other components within normal limits  URINE CULTURE  CBC  BASIC METABOLIC PANEL    EKG EKG Interpretation  Date/Time:  Friday August 30 2018 23:07:30 EDT Ventricular Rate:  73 PR Interval:    QRS Duration: 107 QT Interval:  437 QTC Calculation: 482 R Axis:   -52 Text Interpretation:  Sinus rhythm Prolonged PR interval Left anterior fascicular block RSR' in V1 or V2, probably normal variant Borderline repolarization abnormality No significant change since last tracing Confirmed by Gareth Morgan [phone]) on 08/31/2018 12:17:25 AM   Radiology Dg Chest 1 View  Result Date: 08/30/2018 CLINICAL DATA:  Pain after a fall.  Positive for hip fracture. EXAM: CHEST  1 VIEW COMPARISON:  Chest radiograph 03/12/2017. FINDINGS: The heart is enlarged. There is mild vascular congestion without consolidation or edema. Thoracic atherosclerosis. Low lung volumes. Osteopenia. IMPRESSION: Cardiomegaly with mild vascular congestion. No consolidation or edema.Worsening aeration from priors. Electronically Signed   By: Staci Righter M.D.   On: 08/30/2018 20:29   Ct Head Wo Contrast  Result Date: 08/30/2018 CLINICAL DATA:  Headache, posttraumatic EXAM: CT HEAD WITHOUT CONTRAST TECHNIQUE: Contiguous axial images were obtained from the base of the skull through the vertex without intravenous contrast. COMPARISON:  10/07/2015 FINDINGS: Brain: Atrophy with chronic left MCA distribution encephalomalacia from remote infarct involving the left frontotemporal lobes. Left basal ganglial lacunar infarct  no acute intracranial hemorrhage, midline shift or edema. Midline fourth ventricle and basal cisterns without effacement. No intra-axial mass nor extra-axial fluid collections. Mild ex vacuo dilatation of the adjacent left lateral ventricle. Mild chronic small vessel ischemic disease otherwise noted. Vascular: No hyperdense vessel or unexpected calcification. Skull: Normal. Negative for fracture or focal lesion. Sinuses/Orbits: No acute finding. Status post bilateral lens implants. Other: None. IMPRESSION: Left MCA distribution infarct without cephalization ex vacuo dilatation of the left lateral ventricle as before. No acute intracranial abnormality. Chronic small vessel ischemic disease. Electronically Signed   By: Ashley Royalty M.D.   On: 08/30/2018 20:17   Dg Hip Unilat  With Pelvis 2-3 Views Right  Result Date: 08/30/2018 CLINICAL DATA:  Pain after a fall. EXAM: DG HIP (WITH OR WITHOUT PELVIS) 2-3V RIGHT COMPARISON:  None. FINDINGS: Skeletal osteopenia. Subcapital oblique RIGHT hip fracture, with slight foreshortening. Pelvic bones are intact. Lumbar degenerative change. IMPRESSION: Subcapital oblique RIGHT hip fracture with slight foreshortening. Electronically Signed   By: Staci Righter M.D.   On: 08/30/2018 20:28    Procedures Procedures (including critical care time)  Medications Ordered in ED Medications  polyethylene glycol (MIRALAX / GLYCOLAX) packet 17 g (has no administration in time range)  nitroGLYCERIN (NITROSTAT) SL tablet 0.4 mg (has no administration in time range)  amLODipine (NORVASC) tablet 10 mg (has no administration in time range)  losartan (COZAAR) tablet 100 mg (has no administration in time range)  amiodarone (PACERONE) tablet 200 mg (has no administration in time range)  heparin injection 5,000 Units (has no administration in time range)  0.9 %  sodium chloride infusion (has no administration in time range)  ketorolac (TORADOL) 15 MG/ML injection 15 mg (has no  administration in time range)  ondansetron (ZOFRAN) tablet 4 mg (has no administration in time range)    Or  ondansetron (ZOFRAN) injection 4 mg (has no administration in time range)  Influenza vac split quadrivalent PF (FLUZONE HIGH-DOSE) injection 0.5 mL (has no administration in time range)  fentaNYL (SUBLIMAZE) injection 50 mcg (50 mcg Intravenous Given 08/30/18 2102)     Initial Impression / Assessment and Plan / ED Course  I have reviewed the triage vital signs and the nursing notes.  Pertinent labs &  imaging results that were available during my care of the patient were reviewed by me and considered in my medical decision making (see chart for details).     84 year old female with a history of CVA, atrial fibrillation on Xarelto presents with concern for mechanical fall with right hip pain.  CT had done given patient on Xarelto with fall, shows no sign of intracranial bleed. XR hip shows subcapital hip fractures. NV intact.   Dr. Percell Miller consulted and will evaluate the patient in the morning with likely surgery in AM.  Hospitalist consulted for admission.   Final Clinical Impressions(s) / ED Diagnoses   Final diagnoses:  Right hip pain  Closed subcapital fracture of right femur, initial encounter [facility])    ED Discharge Orders    None       Gareth Morgan, MD 08/31/18 [phone]

## 2018-08-30 NOTE — ED Triage Notes (Signed)
Per EMS, pt lives at home by herself. Pt reports a stumble when she was outside today and fell onto her rt hip. Denies any other injuries, denies hitting head. Pt takes Xarelto. Unknown shortening or rotation of leg d/t pt being unable to straighten her leg out because of pain. Hx of stroke with rt arm deficit/weakness. EMS gave 50 Fentanyl. Pt denies pain sitting still. Pt has c-collar and pelvic binder applied by ems. A&Ox4, pt ambulatory with cane at baseline. EMS VSS. Pt has chemo cream on her face for facial sores by dermatologist, reports nobody should touch it.

## 2018-08-30 NOTE — ED Notes (Signed)
Patient transported to CT 

## 2018-08-31 ENCOUNTER — Encounter (HOSPITAL_COMMUNITY)
Admission: EM | Disposition: A | Discharge status: Home Health | Payer: Self-pay | Source: Home / Self Care | Attending: Internal Medicine

## 2018-08-31 ENCOUNTER — Inpatient Hospital Stay (HOSPITAL_COMMUNITY): Payer: Medicare HMO | Admitting: Anesthesiology

## 2018-08-31 ENCOUNTER — Inpatient Hospital Stay (HOSPITAL_COMMUNITY): Payer: Medicare HMO

## 2018-08-31 ENCOUNTER — Other Ambulatory Visit: Payer: Self-pay

## 2018-08-31 ENCOUNTER — Encounter (HOSPITAL_COMMUNITY): Payer: Self-pay | Admitting: *Deleted

## 2018-08-31 DIAGNOSIS — I10 Essential (primary) hypertension: Secondary | ICD-10-CM

## 2018-08-31 DIAGNOSIS — S72001A Fracture of unspecified part of neck of right femur, initial encounter for closed fracture: Secondary | ICD-10-CM

## 2018-08-31 DIAGNOSIS — N179 Acute kidney failure, unspecified: Secondary | ICD-10-CM

## 2018-08-31 HISTORY — PX: HIP ARTHROPLASTY: SHX981

## 2018-08-31 LAB — CBC
HCT: 38.8 % (ref 36.0–46.0)
Hemoglobin: 12.7 g/dL (ref 12.0–15.0)
MCH: 28.3 pg (ref 26.0–34.0)
MCHC: 32.7 g/dL (ref 30.0–36.0)
MCV: 86.6 fL (ref 78.0–100.0)
Platelets: 210 10*3/uL (ref 150–400)
RBC: 4.48 MIL/uL (ref 3.87–5.11)
RDW: 15.5 % (ref 11.5–15.5)
WBC: 9.3 10*3/uL (ref 4.0–10.5)

## 2018-08-31 LAB — BASIC METABOLIC PANEL
Anion gap: 8 (ref 5–15)
BUN: 13 mg/dL (ref 8–23)
CO2: 21 mmol/L — ABNORMAL LOW (ref 22–32)
Calcium: 9 mg/dL (ref 8.9–10.3)
Chloride: 103 mmol/L (ref 98–111)
Creatinine, Ser: 1.1 mg/dL — ABNORMAL HIGH (ref 0.44–1.00)
GFR calc Af Amer: 50 mL/min — ABNORMAL LOW (ref >60)
GFR calc non Af Amer: 44 mL/min — ABNORMAL LOW (ref >60)
Glucose, Bld: 125 mg/dL — ABNORMAL HIGH (ref 70–99)
Potassium: 4 mmol/L (ref 3.5–5.1)
Sodium: 132 mmol/L — ABNORMAL LOW (ref 135–145)

## 2018-08-31 LAB — SURGICAL PCR SCREEN
MRSA, PCR: NEGATIVE
Staphylococcus aureus: NEGATIVE

## 2018-08-31 SURGERY — HEMIARTHROPLASTY, HIP, DIRECT ANTERIOR APPROACH, FOR FRACTURE
Anesthesia: General | Site: Hip | Laterality: Right

## 2018-08-31 MED ORDER — GLYCOPYRROLATE PF 0.2 MG/ML IJ SOSY
PREFILLED_SYRINGE | INTRAMUSCULAR | Status: DC | PRN
  Administered 2018-08-31 (×2): .2 mg via INTRAVENOUS
  Administered 2018-08-31: .1 mg via INTRAVENOUS

## 2018-08-31 MED ORDER — LOSARTAN POTASSIUM 50 MG PO TABS
100.0000 mg | ORAL_TABLET | Freq: Every day | ORAL | Status: DC
  Administered 2018-08-31: 100 mg via ORAL
  Filled 2018-08-31: qty 2

## 2018-08-31 MED ORDER — HEPARIN SODIUM (PORCINE) 5000 UNIT/ML IJ SOLN: 5000.0000 [IU] | Freq: Three times a day (TID) | INTRAMUSCULAR | Status: DC

## 2018-08-31 MED ORDER — PROPOFOL 10 MG/ML IV BOLUS
INTRAVENOUS | Status: DC | PRN
  Administered 2018-08-31: 80 mg via INTRAVENOUS

## 2018-08-31 MED ORDER — TRANEXAMIC ACID 1000 MG/10ML IV SOLN
2000.0000 mg | Freq: Once | INTRAVENOUS | Status: DC
  Filled 2018-08-31: qty 20

## 2018-08-31 MED ORDER — HYDROCODONE-ACETAMINOPHEN 5-325 MG PO TABS: 1.0000 | ORAL_TABLET | Freq: Four times a day (QID) | ORAL | 0 refills | Status: DC | PRN

## 2018-08-31 MED ORDER — NEOSTIGMINE METHYLSULFATE 5 MG/5ML IV SOSY
PREFILLED_SYRINGE | INTRAVENOUS | Status: DC | PRN
  Administered 2018-08-31: 2 mg via INTRAVENOUS

## 2018-08-31 MED ORDER — PHENYLEPHRINE HCL 10 MG/ML IJ SOLN
INTRAMUSCULAR | Status: DC | PRN
  Administered 2018-08-31 (×2): 40 ug via INTRAVENOUS
  Administered 2018-08-31: 20 ug via INTRAVENOUS
  Administered 2018-08-31 (×3): 40 ug via INTRAVENOUS
  Administered 2018-08-31: 20 ug via INTRAVENOUS

## 2018-08-31 MED ORDER — POLYETHYLENE GLYCOL 3350 17 G PO PACK: 17.0000 g | PACK | Freq: Every day | ORAL | Status: DC | PRN

## 2018-08-31 MED ORDER — TRANEXAMIC ACID 1000 MG/10ML IV SOLN
1000.0000 mg | INTRAVENOUS | Status: AC
  Administered 2018-08-31: 1000 mg via INTRAVENOUS
  Filled 2018-08-31 (×2): qty 10

## 2018-08-31 MED ORDER — BISACODYL 10 MG RE SUPP
10.0000 mg | Freq: Every day | RECTAL | Status: DC | PRN
  Administered 2018-09-01: 10 mg via RECTAL
  Filled 2018-08-31: qty 1

## 2018-08-31 MED ORDER — RIVAROXABAN 15 MG PO TABS
15.0000 mg | ORAL_TABLET | Freq: Every day | ORAL | Status: DC
  Administered 2018-09-01: 15 mg via ORAL
  Filled 2018-08-31: qty 1

## 2018-08-31 MED ORDER — DEXAMETHASONE SODIUM PHOSPHATE 10 MG/ML IJ SOLN
INTRAMUSCULAR | Status: DC | PRN
  Administered 2018-08-31: 10 mg via INTRAVENOUS

## 2018-08-31 MED ORDER — ADULT MULTIVITAMIN W/MINERALS CH
1.0000 | ORAL_TABLET | Freq: Two times a day (BID) | ORAL | Status: DC
  Administered 2018-08-31 – 2018-09-01 (×3): 1 via ORAL
  Filled 2018-08-31 (×3): qty 1

## 2018-08-31 MED ORDER — SODIUM CHLORIDE 0.9 % IV SOLN
INTRAVENOUS | Status: DC
  Administered 2018-08-31: 11:00:00 via INTRAVENOUS
  Administered 2018-08-31: 75 mL/h via INTRAVENOUS

## 2018-08-31 MED ORDER — FLEET ENEMA 7-19 GM/118ML RE ENEM: 1.0000 | ENEMA | Freq: Once | RECTAL | Status: DC | PRN

## 2018-08-31 MED ORDER — ONDANSETRON HCL 4 MG/2ML IJ SOLN: 4.0000 mg | Freq: Four times a day (QID) | INTRAMUSCULAR | Status: DC | PRN

## 2018-08-31 MED ORDER — AMLODIPINE BESYLATE 10 MG PO TABS
10.0000 mg | ORAL_TABLET | Freq: Every day | ORAL | Status: DC
  Administered 2018-09-01: 10 mg via ORAL
  Filled 2018-08-31: qty 1

## 2018-08-31 MED ORDER — METHOCARBAMOL 1000 MG/10ML IJ SOLN
500.0000 mg | Freq: Four times a day (QID) | INTRAVENOUS | Status: DC | PRN
  Filled 2018-08-31: qty 5

## 2018-08-31 MED ORDER — HYDROCODONE-ACETAMINOPHEN 5-325 MG PO TABS: 1.0000 | ORAL_TABLET | Freq: Four times a day (QID) | ORAL | Status: DC | PRN

## 2018-08-31 MED ORDER — AMIODARONE HCL 100 MG PO TABS
200.0000 mg | ORAL_TABLET | ORAL | Status: DC
  Administered 2018-08-31: 200 mg via ORAL
  Filled 2018-08-31: qty 2

## 2018-08-31 MED ORDER — FLUOROURACIL 5 % EX CREA: 1.0000 "application " | TOPICAL_CREAM | Freq: Two times a day (BID) | CUTANEOUS | Status: DC

## 2018-08-31 MED ORDER — FENTANYL CITRATE (PF) 250 MCG/5ML IJ SOLN
INTRAMUSCULAR | Status: AC
  Filled 2018-08-31: qty 5

## 2018-08-31 MED ORDER — CEFAZOLIN SODIUM-DEXTROSE 2-4 GM/100ML-% IV SOLN
2.0000 g | Freq: Four times a day (QID) | INTRAVENOUS | Status: AC
  Administered 2018-08-31 (×2): 2 g via INTRAVENOUS
  Filled 2018-08-31 (×2): qty 100

## 2018-08-31 MED ORDER — METHOCARBAMOL 500 MG PO TABS: 500.0000 mg | ORAL_TABLET | Freq: Four times a day (QID) | ORAL | Status: DC | PRN

## 2018-08-31 MED ORDER — 0.9 % SODIUM CHLORIDE (POUR BTL) OPTIME
TOPICAL | Status: DC | PRN
  Administered 2018-08-31: 1000 mL

## 2018-08-31 MED ORDER — PROPOFOL 10 MG/ML IV BOLUS
INTRAVENOUS | Status: AC
  Filled 2018-08-31: qty 20

## 2018-08-31 MED ORDER — DOCUSATE SODIUM 100 MG PO CAPS
100.0000 mg | ORAL_CAPSULE | Freq: Two times a day (BID) | ORAL | Status: DC
  Administered 2018-08-31 – 2018-09-01 (×3): 100 mg via ORAL
  Filled 2018-08-31 (×3): qty 1

## 2018-08-31 MED ORDER — KETOROLAC TROMETHAMINE 15 MG/ML IJ SOLN
15.0000 mg | Freq: Four times a day (QID) | INTRAMUSCULAR | Status: DC | PRN
  Administered 2018-08-31: 15 mg via INTRAVENOUS
  Filled 2018-08-31: qty 1

## 2018-08-31 MED ORDER — NITROGLYCERIN 0.4 MG SL SUBL: 0.4000 mg | SUBLINGUAL_TABLET | SUBLINGUAL | Status: DC | PRN

## 2018-08-31 MED ORDER — BUPIVACAINE LIPOSOME 1.3 % IJ SUSP
20.0000 mL | Freq: Once | INTRAMUSCULAR | Status: DC
  Filled 2018-08-31: qty 20

## 2018-08-31 MED ORDER — INFLUENZA VAC SPLIT HIGH-DOSE 0.5 ML IM SUSY
0.5000 mL | PREFILLED_SYRINGE | INTRAMUSCULAR | Status: AC
  Administered 2018-09-01: 0.5 mL via INTRAMUSCULAR
  Filled 2018-08-31: qty 0.5

## 2018-08-31 MED ORDER — BUPIVACAINE LIPOSOME 1.3 % IJ SUSP
INTRAMUSCULAR | Status: DC | PRN
  Administered 2018-08-31: 20 mL

## 2018-08-31 MED ORDER — ONDANSETRON HCL 4 MG PO TABS: 4.0000 mg | ORAL_TABLET | Freq: Four times a day (QID) | ORAL | Status: DC | PRN

## 2018-08-31 MED ORDER — ROCURONIUM BROMIDE 50 MG/5ML IV SOSY
PREFILLED_SYRINGE | INTRAVENOUS | Status: DC | PRN
  Administered 2018-08-31: 30 mg via INTRAVENOUS
  Administered 2018-08-31: 10 mg via INTRAVENOUS

## 2018-08-31 MED ORDER — METOCLOPRAMIDE HCL 5 MG PO TABS: 5.0000 mg | ORAL_TABLET | Freq: Three times a day (TID) | ORAL | Status: DC | PRN

## 2018-08-31 MED ORDER — METOCLOPRAMIDE HCL 5 MG/ML IJ SOLN: 5.0000 mg | Freq: Three times a day (TID) | INTRAMUSCULAR | Status: DC | PRN

## 2018-08-31 MED ORDER — ONDANSETRON HCL 4 MG/2ML IJ SOLN
INTRAMUSCULAR | Status: DC | PRN
  Administered 2018-08-31: 4 mg via INTRAVENOUS

## 2018-08-31 MED ORDER — FENTANYL CITRATE (PF) 100 MCG/2ML IJ SOLN
INTRAMUSCULAR | Status: DC | PRN
  Administered 2018-08-31 (×3): 25 ug via INTRAVENOUS

## 2018-08-31 MED ORDER — SODIUM CHLORIDE 0.9% FLUSH
INTRAVENOUS | Status: DC | PRN
  Administered 2018-08-31: 30 mL

## 2018-08-31 MED ORDER — AMIODARONE HCL 100 MG PO TABS: 200.0000 mg | ORAL_TABLET | ORAL | Status: DC

## 2018-08-31 MED ORDER — KETOROLAC TROMETHAMINE 15 MG/ML IJ SOLN
7.5000 mg | Freq: Four times a day (QID) | INTRAMUSCULAR | Status: AC
  Administered 2018-08-31 – 2018-09-01 (×3): 7.5 mg via INTRAVENOUS
  Filled 2018-08-31 (×3): qty 1

## 2018-08-31 MED ORDER — LIDOCAINE 2% (20 MG/ML) 5 ML SYRINGE
INTRAMUSCULAR | Status: DC | PRN
  Administered 2018-08-31: 40 mg via INTRAVENOUS

## 2018-08-31 MED ORDER — FENTANYL CITRATE (PF) 100 MCG/2ML IJ SOLN: 25.0000 ug | INTRAMUSCULAR | Status: DC | PRN

## 2018-08-31 MED ORDER — CEFAZOLIN SODIUM-DEXTROSE 1-4 GM/50ML-% IV SOLN
INTRAVENOUS | Status: DC | PRN
  Administered 2018-08-31: 2 g via INTRAVENOUS

## 2018-08-31 MED ORDER — ACETAMINOPHEN 500 MG PO TABS
500.0000 mg | ORAL_TABLET | Freq: Four times a day (QID) | ORAL | Status: AC
  Administered 2018-08-31 – 2018-09-01 (×4): 500 mg via ORAL
  Filled 2018-08-31 (×4): qty 1

## 2018-08-31 MED ORDER — MORPHINE SULFATE (PF) 2 MG/ML IV SOLN: 0.5000 mg | INTRAVENOUS | Status: DC | PRN

## 2018-08-31 MED ORDER — LACTATED RINGERS IV SOLN
INTRAVENOUS | Status: DC
  Administered 2018-08-31 (×2): via INTRAVENOUS

## 2018-08-31 MED ORDER — POLYETHYLENE GLYCOL 3350 17 G PO PACK
17.0000 g | PACK | Freq: Every day | ORAL | Status: DC
  Administered 2018-08-31: 17 g via ORAL
  Filled 2018-08-31: qty 1

## 2018-08-31 MED ORDER — ACETAMINOPHEN 325 MG PO TABS: 325.0000 mg | ORAL_TABLET | Freq: Four times a day (QID) | ORAL | Status: DC | PRN

## 2018-08-31 SURGICAL SUPPLY — 49 items
BIT DRILL 7/64X5 DISP (BIT) ×2 IMPLANT
BLADE SAGITTAL 25.0X1.27X90 (BLADE) ×2 IMPLANT
CLSR STERI-STRIP ANTIMIC 1/2X4 (GAUZE/BANDAGES/DRESSINGS) ×4 IMPLANT
COVER SURGICAL LIGHT HANDLE (MISCELLANEOUS) ×2 IMPLANT
DRAPE ORTHO SPLIT 77X108 STRL (DRAPES) ×2
DRAPE SURG ORHT 6 SPLT 77X108 (DRAPES) ×2 IMPLANT
DRAPE U-SHAPE 47X51 STRL (DRAPES) ×2 IMPLANT
DRSG MEPILEX BORDER 4X12 (GAUZE/BANDAGES/DRESSINGS) ×1 IMPLANT
DURAPREP 26ML APPLICATOR (WOUND CARE) ×2 IMPLANT
ELECT BLADE 4.0 EZ CLEAN MEGAD (MISCELLANEOUS) ×2
ELECT CAUTERY BLADE 6.4 (BLADE) ×2 IMPLANT
ELECT REM PT RETURN 9FT ADLT (ELECTROSURGICAL) ×2
ELECTRODE BLDE 4.0 EZ CLN MEGD (MISCELLANEOUS) ×1 IMPLANT
ELECTRODE REM PT RTRN 9FT ADLT (ELECTROSURGICAL) ×1 IMPLANT
FACESHIELD WRAPAROUND (MASK) ×2 IMPLANT
FACESHIELD WRAPAROUND OR TEAM (MASK) ×1 IMPLANT
GLOVE BIO SURGEON STRL SZ7.5 (GLOVE) ×4 IMPLANT
GLOVE BIOGEL PI IND STRL 8 (GLOVE) ×2 IMPLANT
GLOVE BIOGEL PI INDICATOR 8 (GLOVE) ×2
GOWN STRL REUS W/ TWL LRG LVL3 (GOWN DISPOSABLE) ×2 IMPLANT
GOWN STRL REUS W/ TWL XL LVL3 (GOWN DISPOSABLE) ×1 IMPLANT
GOWN STRL REUS W/TWL LRG LVL3 (GOWN DISPOSABLE) ×4
GOWN STRL REUS W/TWL XL LVL3 (GOWN DISPOSABLE) ×2
HEAD MODULAR ENDO (Orthopedic Implant) ×1 IMPLANT
HEAD UNPLR 46XMDLR STRL HIP (Orthopedic Implant) IMPLANT
IMMOBILIZER KNEE 22 (SOFTGOODS) ×1 IMPLANT
KIT BASIN OR (CUSTOM PROCEDURE TRAY) ×2 IMPLANT
KIT TURNOVER KIT B (KITS) ×2 IMPLANT
MANIFOLD NEPTUNE II (INSTRUMENTS) ×2 IMPLANT
NEEDLE 22X1 1/2 (OR ONLY) (NEEDLE) ×1 IMPLANT
NS IRRIG 1000ML POUR BTL (IV SOLUTION) ×2 IMPLANT
PACK TOTAL JOINT (CUSTOM PROCEDURE TRAY) ×2 IMPLANT
PAD ARMBOARD 7.5X6 YLW CONV (MISCELLANEOUS) ×4 IMPLANT
RETRIEVER SUT HEWSON (MISCELLANEOUS) ×2 IMPLANT
SLEEVE UNITRAX V40 STD (Orthopedic Implant) ×1 IMPLANT
STEM HIP 4 127DEG (Stem) ×1 IMPLANT
SUT FIBERWIRE #2 38 REV NDL BL (SUTURE) ×4
SUT MNCRL AB 4-0 PS2 18 (SUTURE) ×2 IMPLANT
SUT MON AB 2-0 CT1 36 (SUTURE) ×2 IMPLANT
SUT VIC AB 0 CT1 27 (SUTURE) ×2
SUT VIC AB 0 CT1 27XBRD ANBCTR (SUTURE) IMPLANT
SUT VIC AB 1 CT1 27 (SUTURE) ×2
SUT VIC AB 1 CT1 27XBRD ANBCTR (SUTURE) ×1 IMPLANT
SUT VIC AB 2-0 CT1 27 (SUTURE) ×1
SUT VIC AB 2-0 CT1 TAPERPNT 27 (SUTURE) IMPLANT
SUT VLOC 180 0 24IN GS25 (SUTURE) ×1 IMPLANT
SUTURE FIBERWR#2 38 REV NDL BL (SUTURE) ×2 IMPLANT
TOWEL OR 17X24 6PK STRL BLUE (TOWEL DISPOSABLE) ×2 IMPLANT
TOWEL OR 17X26 10 PK STRL BLUE (TOWEL DISPOSABLE) ×2 IMPLANT

## 2018-08-31 NOTE — Interval H&P Note (Signed)
History and Physical Interval Note:  08/31/2018 7:55 AM  [name]  has presented today for surgery, with the diagnosis of fractured right hip  The various methods of treatment have been discussed with the patient and family. After consideration of risks, benefits and other options for treatment, the patient has consented to  Procedure(s): ARTHROPLASTY BIPOLAR HIP (HEMIARTHROPLASTY) (Right) as a surgical intervention .  The patient's history has been reviewed, patient examined, no change in status, stable for surgery.  I have reviewed the patient's chart and labs.  Questions were answered to the patient's satisfaction.     Renette Butters

## 2018-08-31 NOTE — Anesthesia Postprocedure Evaluation (Signed)
Anesthesia Post Note  Patient: [name]  Procedure(s) Performed: ARTHROPLASTY BIPOLAR HIP (HEMIARTHROPLASTY) (Right Hip)     Patient location during evaluation: PACU Anesthesia Type: General Level of consciousness: awake and alert Pain management: pain level controlled Vital Signs Assessment: post-procedure vital signs reviewed and stable Respiratory status: spontaneous breathing, nonlabored ventilation, respiratory function stable and patient connected to nasal cannula oxygen Cardiovascular status: blood pressure returned to baseline and stable Postop Assessment: no apparent nausea or vomiting Anesthetic complications: no    Last Vitals:  Vitals:   08/31/18 1015 08/31/18 1044  BP: (!) 129/47 (!) 150/77  Pulse: 62 64  Resp: 12 18  Temp:  36.7 C  SpO2: 96% 94%    Last Pain:  Vitals:   08/31/18 1044  TempSrc: Oral  PainSc:                  [name]

## 2018-08-31 NOTE — Plan of Care (Signed)
  Problem: Activity: Goal: Risk for activity intolerance will decrease Outcome: Progressing   Problem: Nutrition: Goal: Adequate nutrition will be maintained Outcome: Progressing   Problem: Coping: Goal: Level of anxiety will decrease Outcome: Progressing   Problem: Elimination: Goal: Will not experience complications related to urinary retention Outcome: Progressing   Problem: Pain Managment: Goal: General experience of comfort will improve Outcome: Progressing   Problem: Safety: Goal: Ability to remain free from injury will improve Outcome: Progressing   

## 2018-08-31 NOTE — Anesthesia Procedure Notes (Signed)
Procedure Name: Intubation Date/Time: 08/31/2018 8:03 AM Performed by: Adalberto Ill, CRNA Pre-anesthesia Checklist: Patient identified, Emergency Drugs available, Suction available, Patient being monitored and Timeout performed Patient Re-evaluated:Patient Re-evaluated prior to induction Oxygen Delivery Method: Circle system utilized Preoxygenation: Pre-oxygenation with 100% oxygen Induction Type: IV induction Ventilation: Mask ventilation without difficulty Laryngoscope Size: Miller and 2 Grade View: Grade I Tube type: Oral Tube size: 7.0 mm Number of attempts: 1 Airway Equipment and Method: Stylet Placement Confirmation: ETT inserted through vocal cords under direct vision,  positive ETCO2 and breath sounds checked- equal and bilateral Secured at: 21 cm Tube secured with: Tape Dental Injury: Teeth and Oropharynx as per pre-operative assessment

## 2018-08-31 NOTE — Anesthesia Preprocedure Evaluation (Addendum)
Anesthesia Evaluation   Patient awake    Reviewed: Allergy & Precautions, NPO status , Patient's Chart, lab work & pertinent test results  Airway Mallampati: II  TM Distance: >3 FB Neck ROM: Full    Dental  (+) Dental Advisory Given   Pulmonary neg pulmonary ROS,    breath sounds clear to auscultation       Cardiovascular hypertension, Pt. on medications + dysrhythmias Atrial Fibrillation  Rhythm:Regular Rate:Normal  Normal LV systolic function; moderate diastolic dysfunction;  elevated LV filling pressure; trace AI; mild MR; moderate LAE;  mild TR with mildly elevated pulmonary pressure.   Neuro/Psych CVA    GI/Hepatic negative GI ROS, Neg liver ROS,   Endo/Other  negative endocrine ROS  Renal/GU Renal disease     Musculoskeletal  (+) Arthritis ,   Abdominal   Peds  Hematology  (+) Blood dyscrasia (on xarelto), ,   Anesthesia Other Findings   Reproductive/Obstetrics                            Lab Results  Component Value Date   WBC 9.3 08/31/2018   HGB 12.7 08/31/2018   HCT 38.8 08/31/2018   MCV 86.6 08/31/2018   PLT 210 08/31/2018   Lab Results  Component Value Date   CREATININE 1.28 (H) 08/30/2018   BUN 19 08/30/2018   NA 134 (L) 08/30/2018   K 4.6 08/30/2018   CL 103 08/30/2018   CO2 21 (L) 08/30/2018    Anesthesia Physical Anesthesia Plan  ASA: III  Anesthesia Plan: General   Post-op Pain Management:    Induction: Intravenous  PONV Risk Score and Plan: 3 and Dexamethasone, Ondansetron and Treatment may vary due to age or medical condition  Airway Management Planned: Oral ETT  Additional Equipment:   Intra-op Plan:   Post-operative Plan: Extubation in OR  Informed Consent: I have reviewed the patients History and Physical, chart, labs and discussed the procedure including the risks, benefits and alternatives for the proposed anesthesia with the patient or  authorized representative who has indicated his/her understanding and acceptance.     Plan Discussed with:   Anesthesia Plan Comments:         Anesthesia Quick Evaluation

## 2018-08-31 NOTE — Addendum Note (Signed)
Addendum  created 08/31/18 1207 by Sammie Bench, CRNA   Intraprocedure Staff edited

## 2018-08-31 NOTE — Plan of Care (Signed)
  Problem: Education: Goal: Knowledge of General Education information will improve Description Including pain rating scale, medication(s)/side effects and non-pharmacologic comfort measures Outcome: Progressing   

## 2018-08-31 NOTE — H&P (View-Only) (Signed)
   ORTHOPAEDIC CONSULTATION  REQUESTING PHYSICIAN: Feliz Ortiz, Abraham, MD  Chief Complaint: Right hip pain  HPI: [name] is a 84 y.o. female who complains of right hip pain after fall in her yard yesterday.  She presented to the emergency room where x-rays showed a displaced right femoral neck fracture.  Orthopedics was consulted for evaluation.  This morning her pain is controlled.  Her last meal was yesterday.  She has a history of A. fib on Xarelto and also has a history of CVA remotely.  She lives alone and ambulates independently with the aid of a cane when out of her home.      Past Medical History:  Diagnosis Date  . Atrial fibrillation (HCC)   . Cervical arthritis   . COLONIC POLYPS, HX OF 09/24/2007  . H/O: hysterectomy    fibroid tumors  . Hip arthritis   . History of colonic polyps   . History of stroke   . HTN (hypertension)   . Hyperlipidemia   . Stroke (HCC) 02/2010  . SUPRAVENTRICULAR ARRHYTHMIA 12/20/2010   s/p RFCA 05/2010-Dr. Taylor   Past Surgical History:  Procedure Laterality Date  . ABDOMINAL HYSTERECTOMY    . BREAST LUMPECTOMY     left  . COLON SURGERY  03/19/2009   colon cancer   . FB removal from popliteal fossae    . INTRACAPSULAR CATARACT EXTRACTION      Left eye Oct 18, 2009  right eye Jan '11 By Dr Richard Epes  . PILONIDAL CYST EXCISION    . TRANSTHORACIC ECHOCARDIOGRAM  2007   Social History   Socioeconomic History  . Marital status: Widowed    Spouse name: Not on file  . Number of children: 6  . Years of education: Not on file  . Highest education level: Not on file  Occupational History  . Occupation: Retired    Employer: RETIRED  Social Needs  . Financial resource strain: Not on file  . Food insecurity:    Worry: Not on file    Inability: Not on file  . Transportation needs:    Medical: Not on file    Non-medical: Not on file  Tobacco Use  . Smoking status: Never Smoker  . Smokeless tobacco: Never Used    Substance and Sexual Activity  . Alcohol use: No    Alcohol/week: 0.0 standard drinks    Comment: rare  . Drug use: No  . Sexual activity: Not Currently  Lifestyle  . Physical activity:    Days per week: Not on file    Minutes per session: Not on file  . Stress: Not on file  Relationships  . Social connections:    Talks on phone: Not on file    Gets together: Not on file    Attends religious service: Not on file    Active member of club or organization: Not on file    Attends meetings of clubs or organizations: Not on file    Relationship status: Not on file  Other Topics Concern  . Not on file  Social History Narrative   married - 1951- widowed '09. 4-daughters, 1 died leukemia; 2 sons; 10 grandchildren; 5 great-grandchildren. I-ADLs. Retired. Lives in her own home but one of her children is with her all of the time. ACP- No CPR, no prolonged mechanical ventilation; not to be sustained long term with artificial feeding or hydration; no chronic HD; no other heroic or futile.            Family History  Problem Relation Age of Onset  . Heart attack Mother   . Stroke Mother   . Coronary artery disease Mother   . Heart disease Mother   . Cancer Father        Colon Cancer  . Diabetes Daughter   . Cancer Daughter        Leukemia   Allergies  Allergen Reactions  . Codeine Rash    fine rash   Prior to Admission medications   Medication Sig Start Date End Date Taking? Authorizing Provider  amiodarone (PACERONE) 200 MG tablet Take one tablet by mouth Monday, Tuesday, Wednesday, Thursday and Friday.  Do not take on Saturday or Sunday. 07/03/18  Yes Evans Lance, MD  amLODipine (NORVASC) 10 MG tablet TAKE 1 TABLET (10 MG TOTAL) BY MOUTH DAILY. 03/13/18  Yes Hoyt Koch, MD  calcium-vitamin D (OSCAL WITH D) 500-200 MG-UNIT tablet Take 1 tablet by mouth every morning.   Yes [provider]  fluorouracil (EFUDEX) 5 % cream Apply 1 application topically 2 (two)  times daily.   Yes [provider]  losartan (COZAAR) 100 MG tablet TAKE 1 TABLET (100 MG TOTAL) BY MOUTH DAILY. 04/25/18  Yes Hoyt Koch, MD  Multiple Vitamin (MULTIVITAMIN WITH MINERALS) TABS tablet Take 1 tablet by mouth 2 (two) times daily.   Yes [provider]  nitroGLYCERIN (NITROSTAT) 0.4 MG SL tablet Place 1 tablet (0.4 mg total) under the tongue every 5 (five) minutes as needed for chest pain (MAX 3 TABLETS). 03/30/17  Yes Baldwin Jamaica, PA-C  polyethylene glycol [facility] / GLYCOLAX) packet Take 17 g by mouth daily as needed (constipation).    Yes [provider]  XARELTO 15 MG TABS tablet TAKE 1 TABLET EVERY DAY 04/25/18  Yes Hoyt Koch, MD   Dg Chest 1 View  Result Date: 08/30/2018 CLINICAL DATA:  Pain after a fall.  Positive for hip fracture. EXAM: CHEST  1 VIEW COMPARISON:  Chest radiograph 03/12/2017. FINDINGS: The heart is enlarged. There is mild vascular congestion without consolidation or edema. Thoracic atherosclerosis. Low lung volumes. Osteopenia. IMPRESSION: Cardiomegaly with mild vascular congestion. No consolidation or edema.Worsening aeration from priors. Electronically Signed   By: Staci Righter M.D.   On: 08/30/2018 20:29   Ct Head Wo Contrast  Result Date: 08/30/2018 CLINICAL DATA:  Headache, posttraumatic EXAM: CT HEAD WITHOUT CONTRAST TECHNIQUE: Contiguous axial images were obtained from the base of the skull through the vertex without intravenous contrast. COMPARISON:  10/07/2015 FINDINGS: Brain: Atrophy with chronic left MCA distribution encephalomalacia from remote infarct involving the left frontotemporal lobes. Left basal ganglial lacunar infarct no acute intracranial hemorrhage, midline shift or edema. Midline fourth ventricle and basal cisterns without effacement. No intra-axial mass nor extra-axial fluid collections. Mild ex vacuo dilatation of the adjacent left lateral ventricle. Mild chronic small vessel ischemic  disease otherwise noted. Vascular: No hyperdense vessel or unexpected calcification. Skull: Normal. Negative for fracture or focal lesion. Sinuses/Orbits: No acute finding. Status post bilateral lens implants. Other: None. IMPRESSION: Left MCA distribution infarct without cephalization ex vacuo dilatation of the left lateral ventricle as before. No acute intracranial abnormality. Chronic small vessel ischemic disease. Electronically Signed   By: Ashley Royalty M.D.   On: 08/30/2018 20:17   Dg Hip Unilat  With Pelvis 2-3 Views Right  Result Date: 08/30/2018 CLINICAL DATA:  Pain after a fall. EXAM: DG HIP (WITH OR WITHOUT PELVIS) 2-3V RIGHT COMPARISON:  None. FINDINGS: Skeletal osteopenia. Subcapital  oblique RIGHT hip fracture, with slight foreshortening. Pelvic bones are intact. Lumbar degenerative change. IMPRESSION: Subcapital oblique RIGHT hip fracture with slight foreshortening. Electronically Signed   By: John T Curnes M.D.   On: 08/30/2018 20:28    Positive ROS: All other systems have been reviewed and were otherwise negative with the exception of those mentioned in the HPI and as above.  Objective: Labs cbc Recent Labs    08/30/18 2107 08/31/18 0614  WBC 10.7* 9.3  HGB 12.0 12.7  HCT 37.3 38.8  PLT 218 210    Labs inflam No results for input(s): CRP in the last 72 hours.  Invalid input(s): ESR  Labs coag No results for input(s): INR, PTT in the last 72 hours.  Invalid input(s): PT  Recent Labs    08/30/18 2107 08/31/18 0614  NA 134* 132*  K 4.6 4.0  CL 103 103  CO2 21* 21*  GLUCOSE 119* 125*  BUN 19 13  CREATININE 1.28* 1.10*  CALCIUM 9.2 9.0    Physical Exam: Vitals:   08/31/18 0004 08/31/18 0415  BP: (!) 140/56 (!) 129/49  Pulse: 67 65  Resp:    Temp: 98.8 F (37.1 C) 98.7 F (37.1 C)  SpO2: 99% 95%   General: Alert, no acute distress.  Right in bed.  Calm, conversant. Mental status: Alert and Oriented x3 Neurologic: Speech Clear and organized, no gross  focal findings or movement disorder appreciated. Respiratory: No cyanosis, no use of accessory musculature Cardiovascular: No pedal edema GI: Abdomen is soft and non-tender, non-distended. Skin: Warm and dry.  No lesions in the area of chief complaint . Extremities: Warm and well perfused w/o edema Psychiatric: Patient is competent for consent with normal mood and affect  MUSCULOSKELETAL:  RLE: Pain with range of motion right hip.  Feet warm.  Wrist flexion plantar flexion intact.  Sensation intact distally.  No lesions in the area of chief complaint. Other extremities are atraumatic with painless ROM and NVI.  Assessment / Plan: Principal Problem:   Closed right femoral fracture (HCC) Active Problems:   Hyperlipidemia   Essential hypertension   Atrial fibrillation (HCC)   Hyponatremia   Acute kidney injury (HCC)   Displaced right femoral neck fracture  Plan for Operative fixation this morning by Dr. T. Murphy -NPO  -Medicine team to admit and perform pre-op clearance -PT/OT post op -Foley okay prn for comfort  -Likely to require Rehab or SNF placement upon discharge.   The risks benefits and alternatives of the procedure were discussed with the patient including but not limited to infection, bleeding, nerve injury, the need for revision surgery, blood clots, cardiopulmonary complications, morbidity, mortality, among others.  The patient verbalizes understanding and wishes to proceed.     Weightbearing: Bedrest.  Will amend post op. VTE prophylaxis:  SCDs.  We will plan to resume Xarelto postoperatively-on this chronically for A. fib. Pain control:   Minimize narcotics if able. Follow-up plan: Will follow in acute inpatient post-op phase.  Plan for outpatient follow up with Dr. T. Murphy in about 2 weeks. Contact information:  Timothy Murphy MD, Raphaela Cannaday Martensen PA-C  Sakina Briones Calvin Martensen III PA-C 08/31/2018 7:43 AM 

## 2018-08-31 NOTE — Op Note (Signed)
08/30/2018 - 08/31/2018  8:56 AM  PATIENT:  [name]   MRN: [id number]  PRE-OPERATIVE DIAGNOSIS:  fractured right hip  POST-OPERATIVE DIAGNOSIS:  * No post-op diagnosis entered *  PROCEDURE:  Procedure(s): ARTHROPLASTY BIPOLAR HIP (HEMIARTHROPLASTY)  PREOPERATIVE INDICATIONS:  [name] is an 84 y.o. female who was admitted 08/30/2018 with a diagnosis of Closed right femoral fracture ([place]) and elected for surgical management.  The risks benefits and alternatives were discussed with the patient including but not limited to the risks of nonoperative treatment, versus surgical intervention including infection, bleeding, nerve injury, periprosthetic fracture, the need for revision surgery, dislocation, leg length discrepancy, blood clots, cardiopulmonary complications, morbidity, mortality, among others, and they were willing to proceed.  Predicted outcome is good, although there will be at least a six to nine month expected recovery.   OPERATIVE REPORT     SURGEON:  Edmonia Lynch, MD    ASSISTANT:  Roxan Hockey, PA-C, he was present and scrubbed throughout the case, critical for completion in a timely fashion, and for retraction, instrumentation, and closure.     ANESTHESIA:  General    COMPLICATIONS:  None.      COMPONENTS:  [employer] A[employer]: Femoral stem: 4, Femoral Head:46, Neck:0   PROCEDURE IN DETAIL: The patient was met in the holding area and identified.  The appropriate hip  was marked at the operative site. The patient was then transported to the OR and  placed under general anesthesia.  At that point, the patient was  placed in the lateral decubitus position with the operative side up and  secured to the operating room table and all bony prominences padded.     The operative lower extremity was prepped from the iliac crest to the toes.  Sterile draping was performed.  Time out was performed prior to incision.      A routine posterolateral approach was utilized  via sharp dissection  carried down to the subcutaneous tissue.  Gross bleeders were Bovie  coagulated.  The iliotibial band was identified and incised  along the length of the skin incision.  Self-retaining retractors were  inserted. I examined the bursa there was significant hematoma and edema I performed a bursectomy here.  With the hip internally rotated, the short external rotators  were identified. The piriformis was tagged with FiberWire, and the hip capsule released in a T-type fashion.  The femoral neck was exposed, and I resected the femoral neck using the appropriate jig. This was performed at approximately a thumb's breadth above the lesser trochanter.    I then exposed the deep acetabulum, cleared out any tissue including the ligamentum teres, and included the hip capsule in the FiberWire used above and below the T.    I then prepared the proximal femur using the cookie-cutter, the lateralizing reamer, and then sequentially broached.  A trial utilized, and I reduced the hip and it was found to have excellent stability with functional range of motion. The trial components were then removed.   The canal and acetabulum were thoroughly irrigated  I inserted the pressfit stem and placed the head and neck collar. The hip was reduced with appropriate force and was stable through a range of motion.   I then used a 2 mm drill bits to pass the FiberWire suture from the capsule and puriform is through the greater trochanter, and secured this. Excellent posterior capsular repair was achieved. I also closed the T in the capsule.  I then irrigated the hip  copiously again with pulse lavage, and repaired the fascia with Vicryl, followed by Vicryl for the subcutaneous tissue, Monocryl for the skin, Steri-Strips and sterile gauze. The wounds were injected. The patient was then awakened and returned to PACU in stable and satisfactory condition. There were no complications.  POST-OP PLAN: Weight bearing as  tolerated. DVT px will consist of SCD's and chemical px  Edmonia Lynch, MD Orthopedic Surgeon [phone]   08/31/2018 8:56 AM

## 2018-08-31 NOTE — Progress Notes (Signed)
PT switched to tele box number 7. Verified rate/rhythm with central telemetry.

## 2018-08-31 NOTE — Progress Notes (Signed)
[facility] PROGRESS NOTE    Progress Note  [name]  FXT:[id number] DOB: [birth year] DOA: 08/30/2018 PCP: Hoyt Koch, MD     Brief Narrative:   [name] is an 84 y.o. female past medical history of chronic atrial fibrillation hypertension previous CVA who was doing yard work today feeding her physician her upon when she fell backwards on her right side x-ray of her right hip show right femoral fracture orthopedic surgery was consulted who recommended surgical intervention.  Assessment/Plan:   Closed displaced fracture of right femoral neck [facility]) Orthopedic surgery was consulted who recommended surgical intervention on 08/23/2018. Hold Xarelto. Conex for pain MiraLAX for constipation induced narcotic  Chronic atrial fibrillation: Continue amiodarone and continue to hold Xarelto.  Essential hypertension: Continue current regimen blood pressure seems to be well controlled.  Hyponatremia likely due to hypovolemia: She was started on IV fluid normal saline, will recheck a basic metabolic panel in the morning.  Acute kidney injury: Likely prerenal in etiology improving with IV fluids, will continue normal saline recheck a basic metabolic panel in the morning.  Hyperlipidemia To new current medication no changes made.   DVT prophylaxis: lovexno Family Communication:none Disposition Plan/Barrier to D/C: SNF in 3 days Code Status:     Code Status Orders  (From admission, onward)         Start     Ordered   08/31/18 0008  Full code  Continuous     08/31/18 0007        Code Status History    This patient has a current code status but no historical code status.    Advance Directive Documentation     Most Recent Value  Type of Advance Directive  Healthcare Power of Attorney, Living will, Out of facility DNR (pink MOST or yellow form)  Pre-existing out of facility DNR order (yellow form or pink MOST form)  -  "MOST" Form in Place?  -         IV Access:    Peripheral IV   Procedures and diagnostic studies:   Dg Chest 1 View  Result Date: 08/30/2018 CLINICAL DATA:  Pain after a fall.  Positive for hip fracture. EXAM: CHEST  1 VIEW COMPARISON:  Chest radiograph 03/12/2017. FINDINGS: The heart is enlarged. There is mild vascular congestion without consolidation or edema. Thoracic atherosclerosis. Low lung volumes. Osteopenia. IMPRESSION: Cardiomegaly with mild vascular congestion. No consolidation or edema.Worsening aeration from priors. Electronically Signed   By: Staci Righter M.D.   On: 08/30/2018 20:29   Ct Head Wo Contrast  Result Date: 08/30/2018 CLINICAL DATA:  Headache, posttraumatic EXAM: CT HEAD WITHOUT CONTRAST TECHNIQUE: Contiguous axial images were obtained from the base of the skull through the vertex without intravenous contrast. COMPARISON:  10/07/2015 FINDINGS: Brain: Atrophy with chronic left MCA distribution encephalomalacia from remote infarct involving the left frontotemporal lobes. Left basal ganglial lacunar infarct no acute intracranial hemorrhage, midline shift or edema. Midline fourth ventricle and basal cisterns without effacement. No intra-axial mass nor extra-axial fluid collections. Mild ex vacuo dilatation of the adjacent left lateral ventricle. Mild chronic small vessel ischemic disease otherwise noted. Vascular: No hyperdense vessel or unexpected calcification. Skull: Normal. Negative for fracture or focal lesion. Sinuses/Orbits: No acute finding. Status post bilateral lens implants. Other: None. IMPRESSION: Left MCA distribution infarct without cephalization ex vacuo dilatation of the left lateral ventricle as before. No acute intracranial abnormality. Chronic small vessel ischemic disease. Electronically Signed   By: Ashley Royalty  M.D.   On: 08/30/2018 20:17   Dg Hip Unilat  With Pelvis 2-3 Views Right  Result Date: 08/30/2018 CLINICAL DATA:  Pain after a fall. EXAM: DG HIP (WITH OR WITHOUT  PELVIS) 2-3V RIGHT COMPARISON:  None. FINDINGS: Skeletal osteopenia. Subcapital oblique RIGHT hip fracture, with slight foreshortening. Pelvic bones are intact. Lumbar degenerative change. IMPRESSION: Subcapital oblique RIGHT hip fracture with slight foreshortening. Electronically Signed   By: Staci Righter M.D.   On: 08/30/2018 20:28     Medical Consultants:    None.  Anti-Infectives:   none  Subjective:    [name] relates her pain is controlled.  Objective:    Vitals:   08/31/18 0415 08/31/18 0945 08/31/18 1000 08/31/18 1015  BP: (!) 129/49 (!) 138/57 (!) 120/51 (!) 129/47  Pulse: 65 62 62 62  Resp:  11 14 12   Temp: 98.7 F (37.1 C) (!) 97.5 F (36.4 C)    TempSrc: Oral     SpO2: 95% 98% 94% 96%  Weight:      Height:        Intake/Output Summary (Last 24 hours) at 08/31/2018 1034 Last data filed at 08/31/2018 0924 Gross per 24 hour  Intake 1000 ml  Output 100 ml  Net 900 ml   Filed Weights   08/30/18 1935  Weight: 50.8 kg    Exam: General exam: In no acute distress. Respiratory system: Good air movement and clear to auscultation. Cardiovascular system: S1 & S2 heard, RRR.  Gastrointestinal system: Abdomen is nondistended, soft and nontender.  Central nervous system: Alert and oriented. No focal neurological deficits. Extremities: right leg short than left and externally rotated. Skin: No rashes, lesions or ulcers Psychiatry: Judgement and insight appear normal. Mood & affect appropriate.    Data Reviewed:    Labs: Basic Metabolic Panel: Recent Labs  Lab 08/30/18 2107 08/31/18 0614  NA 134* 132*  K 4.6 4.0  CL 103 103  CO2 21* 21*  GLUCOSE 119* 125*  BUN 19 13  CREATININE 1.28* 1.10*  CALCIUM 9.2 9.0   GFR Estimated Creatinine Clearance: 24.8 mL/min (A) (by C-G formula based on SCr of 1.1 mg/dL (H)). Liver Function Tests: Recent Labs  Lab 08/30/18 2107  AST 34  ALT 25  ALKPHOS 52  BILITOT 0.7  PROT 6.3*  ALBUMIN 3.8   No  results for input(s): LIPASE, AMYLASE in the last 168 hours. No results for input(s): AMMONIA in the last 168 hours. Coagulation profile No results for input(s): INR, PROTIME in the last 168 hours.  CBC: Recent Labs  Lab 08/30/18 2107 08/31/18 0614  WBC 10.7* 9.3  NEUTROABS 8.9*  --   HGB 12.0 12.7  HCT 37.3 38.8  MCV 87.8 86.6  PLT 218 210   Cardiac Enzymes: No results for input(s): CKTOTAL, CKMB, CKMBINDEX, TROPONINI in the last 168 hours. BNP (last 3 results) No results for input(s): PROBNP in the last 8760 hours. CBG: No results for input(s): GLUCAP in the last 168 hours. D-Dimer: No results for input(s): DDIMER in the last 72 hours. Hgb A1c: No results for input(s): HGBA1C in the last 72 hours. Lipid Profile: No results for input(s): CHOL, HDL, LDLCALC, TRIG, CHOLHDL, LDLDIRECT in the last 72 hours. Thyroid function studies: No results for input(s): TSH, T4TOTAL, T3FREE, THYROIDAB in the last 72 hours.  Invalid input(s): FREET3 Anemia work up: No results for input(s): VITAMINB12, FOLATE, FERRITIN, TIBC, IRON, RETICCTPCT in the last 72 hours. Sepsis Labs: Recent Labs  Lab 08/30/18 2107  08/31/18 0614  WBC 10.7* 9.3   Microbiology Recent Results (from the past 240 hour(s))  Surgical pcr screen     Status: None   Collection Time: 08/31/18 12:48 AM  Result Value Ref Range Status   MRSA, PCR NEGATIVE NEGATIVE Final   Staphylococcus aureus NEGATIVE NEGATIVE Final    Comment: (NOTE) The Xpert SA Assay (FDA approved for NASAL specimens in patients 51 years of age and older), is one component of a comprehensive surveillance program. It is not intended to diagnose infection nor to guide or monitor treatment. Performed at [facility], [place] [address] [place], [place] [address]      Medications:   . [MAR Hold] amiodarone  200 mg Oral 2 times per day on Mon Tue Wed Thu Fri  . [MAR Hold] amLODipine  10 mg Oral Daily  . bupivacaine liposome  20 mL  Infiltration Once  . [MAR Hold] heparin  5,000 Units Subcutaneous Q8H  . [MAR Hold] Influenza vac split quadrivalent PF  0.5 mL Intramuscular Tomorrow-1000  . [MAR Hold] losartan  100 mg Oral QHS   Continuous Infusions: . sodium chloride 75 mL/hr (08/31/18 0041)  . lactated ringers       LOS: 1 day   [facility] Pager [phone]  *Please refer to [email or web address], password [email or web address] to get updated schedule on who will round on this patient, as hospitalists switch teams weekly. If 7PM-7AM, please contact night-coverage at [email or web address], password [email or web address] for any overnight needs.  08/31/2018, 10:34 AM

## 2018-08-31 NOTE — Consult Note (Signed)
   ORTHOPAEDIC CONSULTATION  REQUESTING PHYSICIAN: Feliz Ortiz, Abraham, MD  Chief Complaint: Right hip pain  HPI: [name] is a 84 y.o. female who complains of right hip pain after fall in her yard yesterday.  She presented to the emergency room where x-rays showed a displaced right femoral neck fracture.  Orthopedics was consulted for evaluation.  This morning her pain is controlled.  Her last meal was yesterday.  She has a history of A. fib on Xarelto and also has a history of CVA remotely.  She lives alone and ambulates independently with the aid of a cane when out of her home.      Past Medical History:  Diagnosis Date  . Atrial fibrillation (HCC)   . Cervical arthritis   . COLONIC POLYPS, HX OF 09/24/2007  . H/O: hysterectomy    fibroid tumors  . Hip arthritis   . History of colonic polyps   . History of stroke   . HTN (hypertension)   . Hyperlipidemia   . Stroke (HCC) 02/2010  . SUPRAVENTRICULAR ARRHYTHMIA 12/20/2010   s/p RFCA 05/2010-Dr. Taylor   Past Surgical History:  Procedure Laterality Date  . ABDOMINAL HYSTERECTOMY    . BREAST LUMPECTOMY     left  . COLON SURGERY  03/19/2009   colon cancer   . FB removal from popliteal fossae    . INTRACAPSULAR CATARACT EXTRACTION      Left eye Oct 18, 2009  right eye Jan '11 By Dr Richard Epes  . PILONIDAL CYST EXCISION    . TRANSTHORACIC ECHOCARDIOGRAM  2007   Social History   Socioeconomic History  . Marital status: Widowed    Spouse name: Not on file  . Number of children: 6  . Years of education: Not on file  . Highest education level: Not on file  Occupational History  . Occupation: Retired    Employer: RETIRED  Social Needs  . Financial resource strain: Not on file  . Food insecurity:    Worry: Not on file    Inability: Not on file  . Transportation needs:    Medical: Not on file    Non-medical: Not on file  Tobacco Use  . Smoking status: Never Smoker  . Smokeless tobacco: Never Used    Substance and Sexual Activity  . Alcohol use: No    Alcohol/week: 0.0 standard drinks    Comment: rare  . Drug use: No  . Sexual activity: Not Currently  Lifestyle  . Physical activity:    Days per week: Not on file    Minutes per session: Not on file  . Stress: Not on file  Relationships  . Social connections:    Talks on phone: Not on file    Gets together: Not on file    Attends religious service: Not on file    Active member of club or organization: Not on file    Attends meetings of clubs or organizations: Not on file    Relationship status: Not on file  Other Topics Concern  . Not on file  Social History Narrative   married - 1951- widowed '09. 4-daughters, 1 died leukemia; 2 sons; 10 grandchildren; 5 great-grandchildren. I-ADLs. Retired. Lives in her own home but one of her children is with her all of the time. ACP- No CPR, no prolonged mechanical ventilation; not to be sustained long term with artificial feeding or hydration; no chronic HD; no other heroic or futile.            Family History  Problem Relation Age of Onset  . Heart attack Mother   . Stroke Mother   . Coronary artery disease Mother   . Heart disease Mother   . Cancer Father        Colon Cancer  . Diabetes Daughter   . Cancer Daughter        Leukemia   Allergies  Allergen Reactions  . Codeine Rash    fine rash   Prior to Admission medications   Medication Sig Start Date End Date Taking? Authorizing Provider  amiodarone (PACERONE) 200 MG tablet Take one tablet by mouth Monday, Tuesday, Wednesday, Thursday and Friday.  Do not take on Saturday or Sunday. 07/03/18  Yes Evans Lance, MD  amLODipine (NORVASC) 10 MG tablet TAKE 1 TABLET (10 MG TOTAL) BY MOUTH DAILY. 03/13/18  Yes Hoyt Koch, MD  calcium-vitamin D (OSCAL WITH D) 500-200 MG-UNIT tablet Take 1 tablet by mouth every morning.   Yes [provider]  fluorouracil (EFUDEX) 5 % cream Apply 1 application topically 2 (two)  times daily.   Yes [provider]  losartan (COZAAR) 100 MG tablet TAKE 1 TABLET (100 MG TOTAL) BY MOUTH DAILY. 04/25/18  Yes Hoyt Koch, MD  Multiple Vitamin (MULTIVITAMIN WITH MINERALS) TABS tablet Take 1 tablet by mouth 2 (two) times daily.   Yes [provider]  nitroGLYCERIN (NITROSTAT) 0.4 MG SL tablet Place 1 tablet (0.4 mg total) under the tongue every 5 (five) minutes as needed for chest pain (MAX 3 TABLETS). 03/30/17  Yes Baldwin Jamaica, PA-C  polyethylene glycol [facility] / GLYCOLAX) packet Take 17 g by mouth daily as needed (constipation).    Yes [provider]  XARELTO 15 MG TABS tablet TAKE 1 TABLET EVERY DAY 04/25/18  Yes Hoyt Koch, MD   Dg Chest 1 View  Result Date: 08/30/2018 CLINICAL DATA:  Pain after a fall.  Positive for hip fracture. EXAM: CHEST  1 VIEW COMPARISON:  Chest radiograph 03/12/2017. FINDINGS: The heart is enlarged. There is mild vascular congestion without consolidation or edema. Thoracic atherosclerosis. Low lung volumes. Osteopenia. IMPRESSION: Cardiomegaly with mild vascular congestion. No consolidation or edema.Worsening aeration from priors. Electronically Signed   By: Staci Righter M.D.   On: 08/30/2018 20:29   Ct Head Wo Contrast  Result Date: 08/30/2018 CLINICAL DATA:  Headache, posttraumatic EXAM: CT HEAD WITHOUT CONTRAST TECHNIQUE: Contiguous axial images were obtained from the base of the skull through the vertex without intravenous contrast. COMPARISON:  10/07/2015 FINDINGS: Brain: Atrophy with chronic left MCA distribution encephalomalacia from remote infarct involving the left frontotemporal lobes. Left basal ganglial lacunar infarct no acute intracranial hemorrhage, midline shift or edema. Midline fourth ventricle and basal cisterns without effacement. No intra-axial mass nor extra-axial fluid collections. Mild ex vacuo dilatation of the adjacent left lateral ventricle. Mild chronic small vessel ischemic  disease otherwise noted. Vascular: No hyperdense vessel or unexpected calcification. Skull: Normal. Negative for fracture or focal lesion. Sinuses/Orbits: No acute finding. Status post bilateral lens implants. Other: None. IMPRESSION: Left MCA distribution infarct without cephalization ex vacuo dilatation of the left lateral ventricle as before. No acute intracranial abnormality. Chronic small vessel ischemic disease. Electronically Signed   By: Ashley Royalty M.D.   On: 08/30/2018 20:17   Dg Hip Unilat  With Pelvis 2-3 Views Right  Result Date: 08/30/2018 CLINICAL DATA:  Pain after a fall. EXAM: DG HIP (WITH OR WITHOUT PELVIS) 2-3V RIGHT COMPARISON:  None. FINDINGS: Skeletal osteopenia. Subcapital  oblique RIGHT hip fracture, with slight foreshortening. Pelvic bones are intact. Lumbar degenerative change. IMPRESSION: Subcapital oblique RIGHT hip fracture with slight foreshortening. Electronically Signed   By: John T Curnes M.D.   On: 08/30/2018 20:28    Positive ROS: All other systems have been reviewed and were otherwise negative with the exception of those mentioned in the HPI and as above.  Objective: Labs cbc Recent Labs    08/30/18 2107 08/31/18 0614  WBC 10.7* 9.3  HGB 12.0 12.7  HCT 37.3 38.8  PLT 218 210    Labs inflam No results for input(s): CRP in the last 72 hours.  Invalid input(s): ESR  Labs coag No results for input(s): INR, PTT in the last 72 hours.  Invalid input(s): PT  Recent Labs    08/30/18 2107 08/31/18 0614  NA 134* 132*  K 4.6 4.0  CL 103 103  CO2 21* 21*  GLUCOSE 119* 125*  BUN 19 13  CREATININE 1.28* 1.10*  CALCIUM 9.2 9.0    Physical Exam: Vitals:   08/31/18 0004 08/31/18 0415  BP: (!) 140/56 (!) 129/49  Pulse: 67 65  Resp:    Temp: 98.8 F (37.1 C) 98.7 F (37.1 C)  SpO2: 99% 95%   General: Alert, no acute distress.  Right in bed.  Calm, conversant. Mental status: Alert and Oriented x3 Neurologic: Speech Clear and organized, no gross  focal findings or movement disorder appreciated. Respiratory: No cyanosis, no use of accessory musculature Cardiovascular: No pedal edema GI: Abdomen is soft and non-tender, non-distended. Skin: Warm and dry.  No lesions in the area of chief complaint . Extremities: Warm and well perfused w/o edema Psychiatric: Patient is competent for consent with normal mood and affect  MUSCULOSKELETAL:  RLE: Pain with range of motion right hip.  Feet warm.  Wrist flexion plantar flexion intact.  Sensation intact distally.  No lesions in the area of chief complaint. Other extremities are atraumatic with painless ROM and NVI.  Assessment / Plan: Principal Problem:   Closed right femoral fracture (HCC) Active Problems:   Hyperlipidemia   Essential hypertension   Atrial fibrillation (HCC)   Hyponatremia   Acute kidney injury (HCC)   Displaced right femoral neck fracture  Plan for Operative fixation this morning by Dr. T. Murphy -NPO  -Medicine team to admit and perform pre-op clearance -PT/OT post op -Foley okay prn for comfort  -Likely to require Rehab or SNF placement upon discharge.   The risks benefits and alternatives of the procedure were discussed with the patient including but not limited to infection, bleeding, nerve injury, the need for revision surgery, blood clots, cardiopulmonary complications, morbidity, mortality, among others.  The patient verbalizes understanding and wishes to proceed.     Weightbearing: Bedrest.  Will amend post op. VTE prophylaxis:  SCDs.  We will plan to resume Xarelto postoperatively-on this chronically for A. fib. Pain control:   Minimize narcotics if able. Follow-up plan: Will follow in acute inpatient post-op phase.  Plan for outpatient follow up with Dr. T. Murphy in about 2 weeks. Contact information:  Timothy Murphy MD, Traevion Poehler Martensen PA-C  Jermisha Hoffart Calvin Martensen III PA-C 08/31/2018 7:43 AM 

## 2018-08-31 NOTE — Discharge Instructions (Signed)
You may bear weight as tolerated. Posterior Hip precautions.   Do not cross legs, Pigeon toe, or bend past 90 degrees at waist. Knee immobilizer only needed QHS to facilitate Posterior hip precautions.

## 2018-08-31 NOTE — Transfer of Care (Signed)
Immediate Anesthesia Transfer of Care Note  Patient: [name]  Procedure(s) Performed: ARTHROPLASTY BIPOLAR HIP (HEMIARTHROPLASTY) (Right Hip)  Patient Location: PACU  Anesthesia Type:General  Level of Consciousness: awake, alert , oriented and patient cooperative  Airway & Oxygen Therapy: Patient Spontanous Breathing and Patient connected to face mask oxygen  Post-op Assessment: Report given to RN and Post -op Vital signs reviewed and stable  Post vital signs: Reviewed and stable  Last Vitals:  Vitals Value Taken Time  BP 138/57 08/31/2018  9:43 AM  Temp    Pulse 63 08/31/2018  9:43 AM  Resp 11 08/31/2018  9:43 AM  SpO2 98 % 08/31/2018  9:43 AM  Vitals shown include unvalidated device data.  Last Pain:  Vitals:   08/31/18 0415  TempSrc: Oral  PainSc:          Complications: No apparent anesthesia complications

## 2018-09-01 DIAGNOSIS — E871 Hypo-osmolality and hyponatremia: Secondary | ICD-10-CM

## 2018-09-01 DIAGNOSIS — Z23 Encounter for immunization: Secondary | ICD-10-CM | POA: Diagnosis not present

## 2018-09-01 MED ORDER — LOSARTAN POTASSIUM 100 MG PO TABS: 100.0000 mg | ORAL_TABLET | Freq: Every day | ORAL | 2 refills | Status: DC

## 2018-09-01 MED ORDER — POLYETHYLENE GLYCOL 3350 17 G PO PACK
17.0000 g | PACK | Freq: Two times a day (BID) | ORAL | Status: DC
  Administered 2018-09-01: 17 g via ORAL
  Filled 2018-09-01: qty 1

## 2018-09-01 NOTE — Progress Notes (Signed)
    Subjective: Patient reports pain as mild.  Tolerating diet.  Urinating.   No CP, SOB.  Has stood at edge of bed.  Objective:   VITALS:   Vitals:   08/31/18 1044 08/31/18 2108 08/31/18 2323 09/01/18 0607  BP: (!) 150/77 (!) 152/61 (!) 134/54 (!) 158/56  Pulse: 64  60 63  Resp: 18 14    Temp: 98.1 F (36.7 C) 98.5 F (36.9 C) 98.4 F (36.9 C) 98.6 F (37 C)  TempSrc: Oral Oral Oral Oral  SpO2: 94% 99% 98% 95%  Weight:      Height:       CBC Latest Ref Rng & Units 08/31/2018 08/30/2018 01/01/2018  WBC 4.0 - 10.5 K/uL 9.3 10.7(H) 9.3  Hemoglobin 12.0 - 15.0 g/dL 12.7 12.0 12.9  Hematocrit 36.0 - 46.0 % 38.8 37.3 39.0  Platelets 150 - 400 K/uL 210 218 330.0   BMP Latest Ref Rng & Units 08/31/2018 08/30/2018 01/01/2018  Glucose 70 - 99 mg/dL 125(H) 119(H) 107(H)  BUN 8 - 23 mg/dL 13 19 21   Creatinine 0.44 - 1.00 mg/dL 1.10(H) 1.28(H) 1.29(H)  Sodium 135 - 145 mmol/L 132(L) 134(L) 136  Potassium 3.5 - 5.1 mmol/L 4.0 4.6 3.9  Chloride 98 - 111 mmol/L 103 103 100  CO2 22 - 32 mmol/L 21(L) 21(L) 26  Calcium 8.9 - 10.3 mg/dL 9.0 9.2 9.7   Intake/Output      09/28 0701 - 09/29 0700 09/29 0701 - 09/30 0700   P.O. 480    I.V. (mL/kg) 1895.4 (37.3)    IV Piggyback 85.9    Total Intake(mL/kg) 2461.3 (48.5)    Urine (mL/kg/hr) 0 (0)    Blood 300    Total Output 300    Net +2161.3         Urine Occurrence 3 x       Physical Exam: General: NAD.  Supine in bed on arrival.  Calm, conversant.  Daughter at bedside. MSK Neurovascularly intact Sensation intact distally Intact pulses distally Dorsiflexion/Plantar flexion intact Incision: dressing C/D/I   Assessment: 1 Day Post-Op  S/P Procedure(s) (LRB): ARTHROPLASTY BIPOLAR HIP (HEMIARTHROPLASTY) (Right) by Dr. Ernesta Amble. Percell Miller on 08/31/2018  Principal Problem:   Closed displaced fracture of right femoral neck ([place]) Active Problems:   Hyperlipidemia   Essential hypertension   Atrial fibrillation (HCC)   Closed right  femoral fracture (HCC)   Hyponatremia   Acute kidney injury ([place])   Closed displaced right femoral neck fracture, status post hemiarthroplasty Doing well postop day 1 Eating, drinking, and voiding Pain well controlled Motivated good support at home and so far good early mobilization.    Plan: Up with therapy Incentive Spirometry Apply ice  Weightbearing: WBAT RLE, posterior hip precautions  Insicional and dressing care: Dressings left intact until follow-up Showering: Keep dressing dry VTE prophylaxis: Resume Xarelto.  SCDs, ambulation Pain control: Tylenol.  Minimize narcotics. Follow - up plan: 2 weeks Contact information:  Edmonia Lynch MD, Roxan Hockey PA-C  Dispo: Stable from an orthopedic perspective.  Therapy evaluations pending.  Patient hopes to DC to home in care of her family.   Savannah Elizabeth Martensen III, PA-C 09/01/2018, 7:19 AM

## 2018-09-01 NOTE — Progress Notes (Signed)
Patient and daughter [name] given discharge paperwork and prescription. All questions answered. All belongings sent with patient as well. Patient discharged home with [name] via wheelchair.

## 2018-09-01 NOTE — Evaluation (Signed)
Occupational Therapy Evaluation Patient Details Name: [name] MRN: [id number] DOB: 11-Feb-1928 Today's Date: 09/01/2018    History of Present Illness 84 y.o. female who complains of right hip pain after fall in her yard yesterday.  She presented to the emergency room where x-rays showed a displaced right femoral neck fracture. S/p right THA with posterior approach on 9/28.   Clinical Impression   PTA, pt was living alone and was independent. Currently, pt requires Min-Mod A for LB ADLs with AE and Min Guard A for functional mobility using RW . Provided education on posterior hip precautions, LB ADLs with AE, toileting, and shower transfer with shower seat; pt demonstrated and verbalized understanding. Answered all pt and daughter's questions. Recommend dc home once medically stable per physician. Pt planning for dc later today. All acute OT needs may be met at next venue and will sign off for acute OT. Thank you.     Follow Up Recommendations  Home health OT;Supervision/Assistance - 24 hour    Equipment Recommendations  None recommended by OT    Recommendations for Other Services PT consult     Precautions / Restrictions Precautions Precautions: Posterior Hip Precaution Booklet Issued: Yes (comment) Precaution Comments: Posterior approach and WBAT per Op note Required Braces or Orthoses: Other Brace/Splint(KI in bed only) Restrictions Weight Bearing Restrictions: Yes RLE Weight Bearing: Weight bearing as tolerated Other Position/Activity Restrictions: Posterior approach and WBAT per Op note      Mobility Bed Mobility Overal bed mobility: Needs Assistance Bed Mobility: Supine to Sit     Supine to sit: Min guard     General bed mobility comments: Min Guard A for safety  Transfers Overall transfer level: Needs assistance Equipment used: Rolling walker (2 wheeled) Transfers: Sit to/from Stand Sit to Stand: Min guard         General transfer comment: Min Guard  A for safety    Balance Overall balance assessment: Needs assistance Sitting-balance support: No upper extremity supported;Feet supported Sitting balance-Leahy Scale: Fair     Standing balance support: No upper extremity supported;During functional activity Standing balance-Leahy Scale: Fair                             ADL either performed or assessed with clinical judgement   ADL Overall ADL's : Needs assistance/impaired Eating/Feeding: Set up;Supervision/ safety;Sitting   Grooming: Wash/dry hands;Set up;Supervision/safety;Standing   Upper Body Bathing: Set up;Supervision/ safety;Sitting   Lower Body Bathing: Moderate assistance;Sit to/from stand Lower Body Bathing Details (indicate cue type and reason): Mod A for adherance to posterior hip precautions Upper Body Dressing : Set up;Supervision/safety;Sitting   Lower Body Dressing: Minimal assistance;With adaptive equipment;Cueing for compensatory techniques;Adhering to hip precautions;Sit to/from stand Lower Body Dressing Details (indicate cue type and reason): Min A for donning underwear and socks with AE. Requiring Min A for managing AE and sequencing techniques. Min Guard A for safety in standing Toilet Transfer: Min guard;Ambulation;BSC;RW Toilet Transfer Details (indicate cue type and reason): Min Guard A for safety Toileting- Clothing Manipulation and Hygiene: Min guard;Sit to/from Nurse, children's Details (indicate cue type and reason): Discussed safe shower transfer Functional mobility during ADLs: Min guard;Rolling walker General ADL Comments: Pt demosntrating good fucntional performance post surgery. Requiring Min cues to recall precautions.      Vision Baseline Vision/History: Wears glasses Patient Visual Report: No change from baseline       Perception  Praxis      Pertinent Vitals/Pain Pain Assessment: No/denies pain     Hand Dominance Right   Extremity/Trunk Assessment  Upper Extremity Assessment Upper Extremity Assessment: Overall WFL for tasks assessed   Lower Extremity Assessment Lower Extremity Assessment: Defer to PT evaluation   Cervical / Trunk Assessment Cervical / Trunk Assessment: Normal   Communication Communication Communication: No difficulties   Cognition Arousal/Alertness: Awake/alert Behavior During Therapy: WFL for tasks assessed/performed Overall Cognitive Status: Impaired/Different from baseline Area of Impairment: Memory                     Memory: Decreased recall of precautions         General Comments: Feel pt is at baseline cognition. Decreased memory of precautions and requires VCs   General Comments  Daughter present throughout session    Exercises     Shoulder Instructions      Home Living Family/patient expects to be discharged to:: Private residence Living Arrangements: Alone Available Help at Discharge: Family;Available 24 hours/day Type of Home: House       Home Layout: One level     Bathroom Shower/Tub: Occupational psychologist: Standard     Home Equipment: Environmental consultant - 2 wheels;Shower seat;Bedside commode;Adaptive equipment;Hand held shower head;Grab bars - toilet;Grab bars - tub/shower Adaptive Equipment: Reacher        Prior Functioning/Environment Level of Independence: Independent                 OT Problem List: Decreased range of motion;Decreased activity tolerance;Impaired balance (sitting and/or standing);Decreased knowledge of use of DME or AE;Decreased knowledge of precautions      OT Treatment/Interventions:      OT Goals(Current goals can be found in the care plan section) Acute Rehab OT Goals Patient Stated Goal: "Go home today" OT Goal Formulation: All assessment and education complete, DC therapy  OT Frequency:     Barriers to D/C:            Co-evaluation              AM-PAC PT "6 Clicks" Daily Activity     Outcome Measure Help from  another person eating meals?: None Help from another person taking care of personal grooming?: A Little Help from another person toileting, which includes using toliet, bedpan, or urinal?: A Little Help from another person bathing (including washing, rinsing, drying)?: A Lot Help from another person to put on and taking off regular upper body clothing?: A Little Help from another person to put on and taking off regular lower body clothing?: A Little 6 Click Score: 18   End of Session Equipment Utilized During Treatment: Gait belt;Rolling walker Nurse Communication: Mobility status;Precautions;Weight bearing status  Activity Tolerance: Patient tolerated treatment well Patient left: in chair;with call bell/phone within reach;with family/visitor present  OT Visit Diagnosis: Unsteadiness on feet (R26.81);Other abnormalities of gait and mobility (R26.89);Muscle weakness (generalized) (M62.81)                Time: 9373-4287 OT Time Calculation (min): 42 min Charges:  OT General Charges $OT Visit: 1 Visit OT Evaluation $OT Eval Moderate Complexity: 1 Mod OT Treatments $Self Care/Home Management : 23-37 mins  [place], OTR/L Acute Rehab Pager: [phone] Office: [place] 09/01/2018, 11:33 AM

## 2018-09-01 NOTE — Discharge Summary (Signed)
Physician Discharge Summary  [name] PJA:[id number] DOB: 12/21/1927 DOA: 08/30/2018  PCP: Hoyt Koch, MD  Admit date: 08/30/2018 Discharge date: 09/01/2018  Admitted From: home Disposition:  Home  Recommendations for Outpatient Follow-up:  1. Follow up with [facility] in 1-2 weeks 2. Please obtain BMP/CBC in one week 3. Please follow up on the following pending results:  Home Health:No Equipment/Devices:None  Discharge Condition:stable CODE STATUS:full   Brief/Interim Summary: 84 y.o. female past medical history of chronic atrial fibrillation hypertension previous CVA who was doing yard work today feeding her physician her upon when she fell backwards on her right side x-ray of her right hip show right femoral fracture orthopedic surgery was consulted who recommended surgical intervention.  Discharge Diagnoses:  Principal Problem:   Closed displaced fracture of right femoral neck (HCC) Active Problems:   Hyperlipidemia   Essential hypertension   Atrial fibrillation (HCC)   Closed right femoral fracture (HCC)   Hyponatremia   Acute kidney injury ([place]) Closed displaced fracture of the right femoral neck: Orthopedic surgery was consulted who recommended surgical intervention performed on 08/31/2018 she tolerated the procedure well.  Physical therapy evaluated the patient the recommended skilled nursing facility. At home she will continue MiraLAX p.o. twice daily Tylenol and Norco as needed per orthopedics.  Chronic atrial fibrillation: No changes were made to her medication.  Essential hypertension: No changes were made to her medication.  For her ACE inhibitor which was held for 4 days she will resume it on September 05, 2018.  Hyponatremic hypovolemia: Resolved with IV fluid hydration.  Acute kidney injury: Likely prerenal in etiology this resolved with IV fluid hydration.    Discharge Instructions  Discharge Instructions    Diet - low sodium heart  healthy   Complete by:  As directed    Increase activity slowly   Complete by:  As directed      Allergies as of 09/01/2018      Reactions   Codeine Rash   fine rash      Medication List    TAKE these medications   amiodarone 200 MG tablet Commonly known as:  PACERONE Take one tablet by mouth Monday, Tuesday, Wednesday, Thursday and Friday.  Do not take on Saturday or Sunday.   amLODipine 10 MG tablet Commonly known as:  NORVASC TAKE 1 TABLET (10 MG TOTAL) BY MOUTH DAILY.   calcium-vitamin D 500-200 MG-UNIT tablet Commonly known as:  OSCAL WITH D Take 1 tablet by mouth every morning.   fluorouracil 5 % cream Commonly known as:  EFUDEX Apply 1 application topically 2 (two) times daily.   HYDROcodone-acetaminophen 5-325 MG tablet Commonly known as:  NORCO/VICODIN Take 1 tablet by mouth every 6 (six) hours as needed for moderate pain.   losartan 100 MG tablet Commonly known as:  COZAAR Take 1 tablet (100 mg total) by mouth daily. Start taking on:  09/05/2018 What changed:  These instructions start on 09/05/2018. If you are unsure what to do until then, ask your doctor or other care provider.   multivitamin with minerals Tabs tablet Take 1 tablet by mouth 2 (two) times daily.   nitroGLYCERIN 0.4 MG SL tablet Commonly known as:  NITROSTAT Place 1 tablet (0.4 mg total) under the tongue every 5 (five) minutes as needed for chest pain (MAX 3 TABLETS).   polyethylene glycol packet Commonly known as:  MIRALAX / GLYCOLAX Take 17 g by mouth daily as needed (constipation).   XARELTO 15 MG Tabs tablet Generic drug:  Rivaroxaban TAKE 1 TABLET EVERY DAY      Follow-up Information    Renette Butters, MD Follow up in 2 week(s).   Specialty:  Orthopedic Surgery Contact information: [address] [place] [address] [phone]          Allergies  Allergen Reactions  . Codeine Rash    fine rash     Consultations:  [facility]   Procedures/Studies: Dg Chest 1 View  Result Date: 08/30/2018 CLINICAL DATA:  Pain after a fall.  Positive for hip fracture. EXAM: CHEST  1 VIEW COMPARISON:  Chest radiograph 03/12/2017. FINDINGS: The heart is enlarged. There is mild vascular congestion without consolidation or edema. Thoracic atherosclerosis. Low lung volumes. Osteopenia. IMPRESSION: Cardiomegaly with mild vascular congestion. No consolidation or edema.Worsening aeration from priors. Electronically Signed   By: Staci Righter M.D.   On: 08/30/2018 20:29   Ct Head Wo Contrast  Result Date: 08/30/2018 CLINICAL DATA:  Headache, posttraumatic EXAM: CT HEAD WITHOUT CONTRAST TECHNIQUE: Contiguous axial images were obtained from the base of the skull through the vertex without intravenous contrast. COMPARISON:  10/07/2015 FINDINGS: Brain: Atrophy with chronic left MCA distribution encephalomalacia from remote infarct involving the left frontotemporal lobes. Left basal ganglial lacunar infarct no acute intracranial hemorrhage, midline shift or edema. Midline fourth ventricle and basal cisterns without effacement. No intra-axial mass nor extra-axial fluid collections. Mild ex vacuo dilatation of the adjacent left lateral ventricle. Mild chronic small vessel ischemic disease otherwise noted. Vascular: No hyperdense vessel or unexpected calcification. Skull: Normal. Negative for fracture or focal lesion. Sinuses/Orbits: No acute finding. Status post bilateral lens implants. Other: None. IMPRESSION: Left MCA distribution infarct without cephalization ex vacuo dilatation of the left lateral ventricle as before. No acute intracranial abnormality. Chronic small vessel ischemic disease. Electronically Signed   By: Ashley Royalty M.D.   On: 08/30/2018 20:17   Pelvis Portable  Result Date: 08/31/2018 CLINICAL DATA:  Post right total hip replacement EXAM: PORTABLE PELVIS 1-2 VIEWS COMPARISON:  Right hip radiographs-08/30/2018  FINDINGS: Post bipolar right hip replacement. Alignment appears anatomic given solitary AP projection. No definite fracture. There is a minimal amount of expected subcutaneous emphysema about the operative site. No radiopaque foreign body. Punctate phleboliths overlie the lower pelvis bilaterally. Limited visualization of the contralateral left hip is normal. IMPRESSION: Post bipolar right hip replacement without evidence of complication. Electronically Signed   By: Sandi Mariscal M.D.   On: 08/31/2018 12:45   Dg Hip Unilat  With Pelvis 2-3 Views Right  Result Date: 08/30/2018 CLINICAL DATA:  Pain after a fall. EXAM: DG HIP (WITH OR WITHOUT PELVIS) 2-3V RIGHT COMPARISON:  None. FINDINGS: Skeletal osteopenia. Subcapital oblique RIGHT hip fracture, with slight foreshortening. Pelvic bones are intact. Lumbar degenerative change. IMPRESSION: Subcapital oblique RIGHT hip fracture with slight foreshortening. Electronically Signed   By: Staci Righter M.D.   On: 08/30/2018 20:28      Subjective: No new complaints feels great.  Discharge Exam: Vitals:   08/31/18 2323 09/01/18 0607  BP: (!) 134/54 (!) 158/56  Pulse: 60 63  Resp:    Temp: 98.4 F (36.9 C) 98.6 F (37 C)  SpO2: 98% 95%   Vitals:   08/31/18 1044 08/31/18 2108 08/31/18 2323 09/01/18 0607  BP: (!) 150/77 (!) 152/61 (!) 134/54 (!) 158/56  Pulse: 64  60 63  Resp: 18 14    Temp: 98.1 F (36.7 C) 98.5 F (36.9 C) 98.4 F (36.9 C) 98.6 F (37 C)  TempSrc: Oral Oral Oral Oral  SpO2: 94% 99% 98% 95%  Weight:      Height:        General: Pt is alert, awake, not in acute distress Cardiovascular: RRR, S1/S2 +, no rubs, no gallops Respiratory: CTA bilaterally, no wheezing, no rhonchi Abdominal: Soft, NT, ND, bowel sounds + Extremities: no edema, no cyanosis    The results of significant diagnostics from this hospitalization (including imaging, microbiology, ancillary and laboratory) are listed below for reference.      Microbiology: Recent Results (from the past 240 hour(s))  Surgical pcr screen     Status: None   Collection Time: 08/31/18 12:48 AM  Result Value Ref Range Status   MRSA, PCR NEGATIVE NEGATIVE Final   Staphylococcus aureus NEGATIVE NEGATIVE Final    Comment: (NOTE) The Xpert SA Assay (FDA approved for NASAL specimens in patients 12 years of age and older), is one component of a comprehensive surveillance program. It is not intended to diagnose infection nor to guide or monitor treatment. Performed at [facility], [place] [address], [place], [place] [address]      Labs: BNP (last 3 results) No results for input(s): BNP in the last 8760 hours. Basic Metabolic Panel: Recent Labs  Lab 08/30/18 2107 08/31/18 0614  NA 134* 132*  K 4.6 4.0  CL 103 103  CO2 21* 21*  GLUCOSE 119* 125*  BUN 19 13  CREATININE 1.28* 1.10*  CALCIUM 9.2 9.0   Liver Function Tests: Recent Labs  Lab 08/30/18 2107  AST 34  ALT 25  ALKPHOS 52  BILITOT 0.7  PROT 6.3*  ALBUMIN 3.8   No results for input(s): LIPASE, AMYLASE in the last 168 hours. No results for input(s): AMMONIA in the last 168 hours. CBC: Recent Labs  Lab 08/30/18 2107 08/31/18 0614  WBC 10.7* 9.3  NEUTROABS 8.9*  --   HGB 12.0 12.7  HCT 37.3 38.8  MCV 87.8 86.6  PLT 218 210   Cardiac Enzymes: No results for input(s): CKTOTAL, CKMB, CKMBINDEX, TROPONINI in the last 168 hours. BNP: Invalid input(s): POCBNP CBG: No results for input(s): GLUCAP in the last 168 hours. D-Dimer No results for input(s): DDIMER in the last 72 hours. Hgb A1c No results for input(s): HGBA1C in the last 72 hours. Lipid Profile No results for input(s): CHOL, HDL, LDLCALC, TRIG, CHOLHDL, LDLDIRECT in the last 72 hours. Thyroid function studies No results for input(s): TSH, T4TOTAL, T3FREE, THYROIDAB in the last 72 hours.  Invalid input(s): FREET3 Anemia work up No results for input(s): VITAMINB12, FOLATE, FERRITIN, TIBC, IRON,  RETICCTPCT in the last 72 hours. Urinalysis    Component Value Date/Time   COLORURINE YELLOW 08/30/2018 2253   APPEARANCEUR CLEAR 08/30/2018 2253   LABSPEC 1.008 08/30/2018 2253   PHURINE 7.0 08/30/2018 2253   GLUCOSEU NEGATIVE 08/30/2018 2253   HGBUR NEGATIVE 08/30/2018 2253   BILIRUBINUR NEGATIVE 08/30/2018 2253   KETONESUR NEGATIVE 08/30/2018 2253   PROTEINUR NEGATIVE 08/30/2018 2253   UROBILINOGEN 1.0 10/24/2014 1115   NITRITE POSITIVE (A) 08/30/2018 2253   LEUKOCYTESUR TRACE (A) 08/30/2018 2253   Sepsis Labs Invalid input(s): PROCALCITONIN,  WBC,  LACTICIDVEN Microbiology Recent Results (from the past 240 hour(s))  Surgical pcr screen     Status: None   Collection Time: 08/31/18 12:48 AM  Result Value Ref Range Status   MRSA, PCR NEGATIVE NEGATIVE Final   Staphylococcus aureus NEGATIVE NEGATIVE Final    Comment: (NOTE) The Xpert SA Assay (FDA approved for  NASAL specimens in patients 16 years of age and older), is one component of a comprehensive surveillance program. It is not intended to diagnose infection nor to guide or monitor treatment. Performed at [facility], [place] [address] [place], [place] [address]      Time coordinating discharge: 40 minutes  SIGNED:   Charlynne Cousins, MD  [facility] 09/01/2018, 9:18 AM Pager   If 7PM-7AM, please contact night-coverage [email or web address] Password [email or web address]

## 2018-09-01 NOTE — Evaluation (Signed)
Physical Therapy Evaluation Patient Details Name: [name] MRN: [id number] DOB: June 16, 1928 Today's Date: 09/01/2018   History of Present Illness  84 y.o. female who complains of right hip pain after fall in her yard yesterday.  She presented to the emergency room where x-rays showed a displaced right femoral neck fracture. S/p right THA with posterior approach on 9/28.  Clinical Impression   Patient is s/p above surgery resulting in functional limitations due to the deficits listed below (see PT Problem List). Independent living alone at home prior to admission; Presents with decr functional mobility, post fall and R hip hemiarthroplasty; Daughter present and able to remember hip precautions; Noting family hopeful for home today, and, given she has 24 hour assistance at home, this is not unreasonable; I anticipate good progress;  Patient will benefit from skilled PT to increase their independence and safety with mobility to allow discharge to the venue listed below.       Follow Up Recommendations Home health PT;Supervision/Assistance - 24 hour    Equipment Recommendations  Rolling walker with 5" wheels;3in1 (PT)(may already have)    Recommendations for Other Services       Precautions / Restrictions Precautions Precautions: Posterior Hip Precaution Booklet Issued: Yes (comment) Precaution Comments: Posterior approach and WBAT per Op note Required Braces or Orthoses: Other Brace/Splint(KI in bed only) Restrictions Weight Bearing Restrictions: Yes RLE Weight Bearing: Weight bearing as tolerated Other Position/Activity Restrictions: Posterior approach and WBAT per Op note      Mobility  Bed Mobility Overal bed mobility: Needs Assistance Bed Mobility: Supine to Sit     Supine to sit: Min guard     General bed mobility comments: Demonstrational cues for getting in and out of bed with posterior hip prec  Transfers Overall transfer level: Needs assistance Equipment  used: Rolling walker (2 wheeled) Transfers: Sit to/from Stand Sit to Stand: Min guard         General transfer comment: Min Guard A for safety; cues to postiion for post hip prec  Ambulation/Gait Ambulation/Gait assistance: Counsellor (Feet): 120 Feet Assistive device: Rolling walker (2 wheeled) Gait Pattern/deviations: Step-through pattern(emerging)     General Gait Details: Cues for sequence, optimal step length, RW height adjusted for optimal fit; overall moving quite well  Stairs Stairs: Yes Stairs assistance: Min guard Stair Management: No rails;With walker;Backwards Number of Stairs: 1 General stair comments: Verbal and demo cues to ascend and descend 1 step to approximate backing up the step to get into her bed  Wheelchair Mobility    Modified Rankin (Stroke Patients Only)       Balance Overall balance assessment: Needs assistance Sitting-balance support: No upper extremity supported;Feet supported Sitting balance-Leahy Scale: Fair     Standing balance support: No upper extremity supported;During functional activity Standing balance-Leahy Scale: Fair                               Pertinent Vitals/Pain Pain Assessment: 0-10 Pain Score: 1  Pain Location: Discomfort in R hip Pain Descriptors / Indicators: Discomfort Pain Intervention(s): Monitored during session    Home Living Family/patient expects to be discharged to:: Private residence Living Arrangements: Alone Available Help at Discharge: Family;Available 24 hours/day Type of Home: House       Home Layout: One level Home Equipment: [place] - 2 wheels;Shower seat;Bedside commode;Adaptive equipment;Hand held shower head;Grab bars - toilet;Grab bars - tub/shower      Prior  Function Level of Independence: Independent               Hand Dominance   Dominant Hand: Right    Extremity/Trunk Assessment   Upper Extremity Assessment Upper Extremity Assessment: Defer  to OT evaluation    Lower Extremity Assessment Lower Extremity Assessment: RLE deficits/detail RLE Deficits / Details: R hip weak postop, but still moving well, with no noted R hip flexion in stance during gait    Cervical / Trunk Assessment Cervical / Trunk Assessment: Normal  Communication   Communication: No difficulties  Cognition Arousal/Alertness: Awake/alert Behavior During Therapy: WFL for tasks assessed/performed Overall Cognitive Status: Impaired/Different from baseline Area of Impairment: Memory                     Memory: Decreased recall of precautions         General Comments: Feel pt is at baseline cognition. Decreased memory of precautions and requires VCs      General Comments General comments (skin integrity, edema, etc.): Daughter present throughout session; Briefly went over Hip Exercise sheet with pt and daughter; Defer to HHPT to progress therex    Exercises     Assessment/Plan    PT Assessment Patient needs continued PT services  PT Problem List Decreased strength;Decreased range of motion;Decreased activity tolerance;Decreased balance;Decreased mobility;Decreased cognition;Decreased knowledge of use of DME;Decreased safety awareness;Decreased knowledge of precautions       PT Treatment Interventions DME instruction;Gait training;Stair training;Functional mobility training;Therapeutic activities;Therapeutic exercise;Balance training;Patient/family education    PT Goals (Current goals can be found in the Care Plan section)  Acute Rehab PT Goals Patient Stated Goal: "Go home today" PT Goal Formulation: With patient Time For Goal Achievement: 09/15/18 Potential to Achieve Goals: Good    Frequency Min 6X/week   Barriers to discharge        Co-evaluation               AM-PAC PT "6 Clicks" Daily Activity  Outcome Measure Difficulty turning over in bed (including adjusting bedclothes, sheets and blankets)?: A Little Difficulty  moving from lying on back to sitting on the side of the bed? : A Little Difficulty sitting down on and standing up from a chair with arms (e.g., wheelchair, bedside commode, etc,.)?: A Little Help needed moving to and from a bed to chair (including a wheelchair)?: None Help needed walking in hospital room?: None Help needed climbing 3-5 steps with a railing? : A Little 6 Click Score: 20    End of Session Equipment Utilized During Treatment: Gait belt Activity Tolerance: Patient tolerated treatment well Patient left: in chair;with call bell/phone within reach;with family/visitor present Nurse Communication: Mobility status PT Visit Diagnosis: Other abnormalities of gait and mobility (R26.89)    Time: 1001-1031(dovetail with OT) PT Time Calculation (min) (ACUTE ONLY): 30 min   Charges:   PT Evaluation $PT Eval Moderate Complexity: [place] Pager [phone] Office [place] 09/01/2018, 11:57 AM

## 2018-09-01 NOTE — Care Management Note (Signed)
Case Management Note  Patient Details  Name: [name] MRN: [id number] Date of Birth: 1928-03-07  Subjective/Objective:     Pt presents for hip fracture and surgical repair.  Pt from home alone but has children to help as necessary.  Pt has DME cane, 3n1 , RW and multiple other DME from previous stroke.  Daughter states patient uses DME as necessary and is usually safe at home. Daughter will assist patient during recovery.   Action/Plan: Daughter requests [facility] for home health needs.  Referral called to [facility] for [facility] PT, OT, NA.  [name] accepted referral and daughter aware to expect call by Tuesday.    Expected Discharge Date:  09/01/18               Expected Discharge Plan:  [name]  In-House Referral:  NA  Discharge planning Services  CM Consult  Post Acute Care Choice:  Home Health Choice offered to:  Adult Children  DME Arranged:  N/A DME Agency:  NA  HH Arranged:  PT, OT, Nurse's Aide [place] Agency:  [place]  Status of Service:  Completed, signed off  If discussed at [place] of Stay Meetings, dates discussed:    Additional Comments:  Claudie Leach, RN 09/01/2018, 9:36 AM

## 2018-09-02 ENCOUNTER — Encounter (HOSPITAL_COMMUNITY): Payer: Self-pay | Admitting: Orthopedic Surgery

## 2018-09-02 DIAGNOSIS — Z96641 Presence of right artificial hip joint: Secondary | ICD-10-CM | POA: Diagnosis not present

## 2018-09-02 DIAGNOSIS — Z7901 Long term (current) use of anticoagulants: Secondary | ICD-10-CM | POA: Diagnosis not present

## 2018-09-02 DIAGNOSIS — Z8673 Personal history of transient ischemic attack (TIA), and cerebral infarction without residual deficits: Secondary | ICD-10-CM | POA: Diagnosis not present

## 2018-09-02 DIAGNOSIS — I4891 Unspecified atrial fibrillation: Secondary | ICD-10-CM | POA: Diagnosis not present

## 2018-09-02 DIAGNOSIS — E785 Hyperlipidemia, unspecified: Secondary | ICD-10-CM | POA: Diagnosis not present

## 2018-09-02 DIAGNOSIS — S72001D Fracture of unspecified part of neck of right femur, subsequent encounter for closed fracture with routine healing: Secondary | ICD-10-CM | POA: Diagnosis not present

## 2018-09-02 DIAGNOSIS — I10 Essential (primary) hypertension: Secondary | ICD-10-CM | POA: Diagnosis not present

## 2018-09-03 ENCOUNTER — Telehealth: Payer: Self-pay | Admitting: *Deleted

## 2018-09-03 DIAGNOSIS — I4891 Unspecified atrial fibrillation: Secondary | ICD-10-CM | POA: Diagnosis not present

## 2018-09-03 DIAGNOSIS — S72001D Fracture of unspecified part of neck of right femur, subsequent encounter for closed fracture with routine healing: Secondary | ICD-10-CM | POA: Diagnosis not present

## 2018-09-03 DIAGNOSIS — Z8673 Personal history of transient ischemic attack (TIA), and cerebral infarction without residual deficits: Secondary | ICD-10-CM | POA: Diagnosis not present

## 2018-09-03 DIAGNOSIS — Z7901 Long term (current) use of anticoagulants: Secondary | ICD-10-CM | POA: Diagnosis not present

## 2018-09-03 DIAGNOSIS — E785 Hyperlipidemia, unspecified: Secondary | ICD-10-CM | POA: Diagnosis not present

## 2018-09-03 DIAGNOSIS — Z96641 Presence of right artificial hip joint: Secondary | ICD-10-CM | POA: Diagnosis not present

## 2018-09-03 DIAGNOSIS — I10 Essential (primary) hypertension: Secondary | ICD-10-CM | POA: Diagnosis not present

## 2018-09-03 NOTE — Telephone Encounter (Signed)
Pt was on TCM report admitted 08/30/18 after fall. Pt fell backward on her right side x-ray of her right hip show right femoral fracture orthopedic surgery was consulted who recommended surgical intervention. Ssurgical intervention performed on 08/31/2018 she tolerated the procedure well.  Physical therapy evaluated the patient recommended skilled nursing facility. Pt D/C 09/01/18, and will follow-up w/ Orthopedic in 2 weeks.Marland KitchenJohny Chess

## 2018-09-06 DIAGNOSIS — Z96641 Presence of right artificial hip joint: Secondary | ICD-10-CM | POA: Diagnosis not present

## 2018-09-06 DIAGNOSIS — Z8673 Personal history of transient ischemic attack (TIA), and cerebral infarction without residual deficits: Secondary | ICD-10-CM | POA: Diagnosis not present

## 2018-09-06 DIAGNOSIS — I10 Essential (primary) hypertension: Secondary | ICD-10-CM | POA: Diagnosis not present

## 2018-09-06 DIAGNOSIS — I4891 Unspecified atrial fibrillation: Secondary | ICD-10-CM | POA: Diagnosis not present

## 2018-09-06 DIAGNOSIS — Z7901 Long term (current) use of anticoagulants: Secondary | ICD-10-CM | POA: Diagnosis not present

## 2018-09-06 DIAGNOSIS — S72001D Fracture of unspecified part of neck of right femur, subsequent encounter for closed fracture with routine healing: Secondary | ICD-10-CM | POA: Diagnosis not present

## 2018-09-06 DIAGNOSIS — E785 Hyperlipidemia, unspecified: Secondary | ICD-10-CM | POA: Diagnosis not present

## 2018-09-09 DIAGNOSIS — S72001D Fracture of unspecified part of neck of right femur, subsequent encounter for closed fracture with routine healing: Secondary | ICD-10-CM | POA: Diagnosis not present

## 2018-09-09 DIAGNOSIS — Z7901 Long term (current) use of anticoagulants: Secondary | ICD-10-CM | POA: Diagnosis not present

## 2018-09-09 DIAGNOSIS — Z8673 Personal history of transient ischemic attack (TIA), and cerebral infarction without residual deficits: Secondary | ICD-10-CM | POA: Diagnosis not present

## 2018-09-09 DIAGNOSIS — I4891 Unspecified atrial fibrillation: Secondary | ICD-10-CM | POA: Diagnosis not present

## 2018-09-09 DIAGNOSIS — Z96641 Presence of right artificial hip joint: Secondary | ICD-10-CM | POA: Diagnosis not present

## 2018-09-09 DIAGNOSIS — I10 Essential (primary) hypertension: Secondary | ICD-10-CM | POA: Diagnosis not present

## 2018-09-09 DIAGNOSIS — E785 Hyperlipidemia, unspecified: Secondary | ICD-10-CM | POA: Diagnosis not present

## 2018-09-10 DIAGNOSIS — Z8673 Personal history of transient ischemic attack (TIA), and cerebral infarction without residual deficits: Secondary | ICD-10-CM | POA: Diagnosis not present

## 2018-09-10 DIAGNOSIS — Z96641 Presence of right artificial hip joint: Secondary | ICD-10-CM | POA: Diagnosis not present

## 2018-09-10 DIAGNOSIS — I4891 Unspecified atrial fibrillation: Secondary | ICD-10-CM | POA: Diagnosis not present

## 2018-09-10 DIAGNOSIS — I10 Essential (primary) hypertension: Secondary | ICD-10-CM | POA: Diagnosis not present

## 2018-09-10 DIAGNOSIS — Z7901 Long term (current) use of anticoagulants: Secondary | ICD-10-CM | POA: Diagnosis not present

## 2018-09-10 DIAGNOSIS — E785 Hyperlipidemia, unspecified: Secondary | ICD-10-CM | POA: Diagnosis not present

## 2018-09-10 DIAGNOSIS — S72001D Fracture of unspecified part of neck of right femur, subsequent encounter for closed fracture with routine healing: Secondary | ICD-10-CM | POA: Diagnosis not present

## 2018-09-12 DIAGNOSIS — I4891 Unspecified atrial fibrillation: Secondary | ICD-10-CM | POA: Diagnosis not present

## 2018-09-12 DIAGNOSIS — E785 Hyperlipidemia, unspecified: Secondary | ICD-10-CM | POA: Diagnosis not present

## 2018-09-12 DIAGNOSIS — I10 Essential (primary) hypertension: Secondary | ICD-10-CM | POA: Diagnosis not present

## 2018-09-12 DIAGNOSIS — S72001D Fracture of unspecified part of neck of right femur, subsequent encounter for closed fracture with routine healing: Secondary | ICD-10-CM | POA: Diagnosis not present

## 2018-09-12 DIAGNOSIS — Z8673 Personal history of transient ischemic attack (TIA), and cerebral infarction without residual deficits: Secondary | ICD-10-CM | POA: Diagnosis not present

## 2018-09-12 DIAGNOSIS — Z96641 Presence of right artificial hip joint: Secondary | ICD-10-CM | POA: Diagnosis not present

## 2018-09-12 DIAGNOSIS — Z7901 Long term (current) use of anticoagulants: Secondary | ICD-10-CM | POA: Diagnosis not present

## 2018-09-13 DIAGNOSIS — I4891 Unspecified atrial fibrillation: Secondary | ICD-10-CM | POA: Diagnosis not present

## 2018-09-13 DIAGNOSIS — E785 Hyperlipidemia, unspecified: Secondary | ICD-10-CM | POA: Diagnosis not present

## 2018-09-13 DIAGNOSIS — Z8673 Personal history of transient ischemic attack (TIA), and cerebral infarction without residual deficits: Secondary | ICD-10-CM | POA: Diagnosis not present

## 2018-09-13 DIAGNOSIS — S72001D Fracture of unspecified part of neck of right femur, subsequent encounter for closed fracture with routine healing: Secondary | ICD-10-CM | POA: Diagnosis not present

## 2018-09-13 DIAGNOSIS — Z7901 Long term (current) use of anticoagulants: Secondary | ICD-10-CM | POA: Diagnosis not present

## 2018-09-13 DIAGNOSIS — Z96641 Presence of right artificial hip joint: Secondary | ICD-10-CM | POA: Diagnosis not present

## 2018-09-13 DIAGNOSIS — I10 Essential (primary) hypertension: Secondary | ICD-10-CM | POA: Diagnosis not present

## 2018-09-16 DIAGNOSIS — E785 Hyperlipidemia, unspecified: Secondary | ICD-10-CM | POA: Diagnosis not present

## 2018-09-16 DIAGNOSIS — I4891 Unspecified atrial fibrillation: Secondary | ICD-10-CM | POA: Diagnosis not present

## 2018-09-16 DIAGNOSIS — Z7901 Long term (current) use of anticoagulants: Secondary | ICD-10-CM | POA: Diagnosis not present

## 2018-09-16 DIAGNOSIS — S72001D Fracture of unspecified part of neck of right femur, subsequent encounter for closed fracture with routine healing: Secondary | ICD-10-CM | POA: Diagnosis not present

## 2018-09-16 DIAGNOSIS — Z8673 Personal history of transient ischemic attack (TIA), and cerebral infarction without residual deficits: Secondary | ICD-10-CM | POA: Diagnosis not present

## 2018-09-16 DIAGNOSIS — Z96641 Presence of right artificial hip joint: Secondary | ICD-10-CM | POA: Diagnosis not present

## 2018-09-16 DIAGNOSIS — I10 Essential (primary) hypertension: Secondary | ICD-10-CM | POA: Diagnosis not present

## 2018-09-17 DIAGNOSIS — S72001D Fracture of unspecified part of neck of right femur, subsequent encounter for closed fracture with routine healing: Secondary | ICD-10-CM | POA: Diagnosis not present

## 2018-09-17 DIAGNOSIS — Z8673 Personal history of transient ischemic attack (TIA), and cerebral infarction without residual deficits: Secondary | ICD-10-CM | POA: Diagnosis not present

## 2018-09-17 DIAGNOSIS — I10 Essential (primary) hypertension: Secondary | ICD-10-CM | POA: Diagnosis not present

## 2018-09-17 DIAGNOSIS — Z7901 Long term (current) use of anticoagulants: Secondary | ICD-10-CM | POA: Diagnosis not present

## 2018-09-17 DIAGNOSIS — E785 Hyperlipidemia, unspecified: Secondary | ICD-10-CM | POA: Diagnosis not present

## 2018-09-17 DIAGNOSIS — Z96641 Presence of right artificial hip joint: Secondary | ICD-10-CM | POA: Diagnosis not present

## 2018-09-17 DIAGNOSIS — I4891 Unspecified atrial fibrillation: Secondary | ICD-10-CM | POA: Diagnosis not present

## 2018-09-18 DIAGNOSIS — S72001A Fracture of unspecified part of neck of right femur, initial encounter for closed fracture: Secondary | ICD-10-CM | POA: Diagnosis not present

## 2018-09-18 DIAGNOSIS — S72001D Fracture of unspecified part of neck of right femur, subsequent encounter for closed fracture with routine healing: Secondary | ICD-10-CM | POA: Diagnosis not present

## 2018-09-19 ENCOUNTER — Telehealth: Payer: Self-pay | Admitting: Internal Medicine

## 2018-09-19 DIAGNOSIS — I4891 Unspecified atrial fibrillation: Secondary | ICD-10-CM | POA: Diagnosis not present

## 2018-09-19 DIAGNOSIS — Z96641 Presence of right artificial hip joint: Secondary | ICD-10-CM | POA: Diagnosis not present

## 2018-09-19 DIAGNOSIS — Z8673 Personal history of transient ischemic attack (TIA), and cerebral infarction without residual deficits: Secondary | ICD-10-CM | POA: Diagnosis not present

## 2018-09-19 DIAGNOSIS — Z7901 Long term (current) use of anticoagulants: Secondary | ICD-10-CM | POA: Diagnosis not present

## 2018-09-19 DIAGNOSIS — S72001D Fracture of unspecified part of neck of right femur, subsequent encounter for closed fracture with routine healing: Secondary | ICD-10-CM | POA: Diagnosis not present

## 2018-09-19 DIAGNOSIS — I10 Essential (primary) hypertension: Secondary | ICD-10-CM | POA: Diagnosis not present

## 2018-09-19 DIAGNOSIS — E785 Hyperlipidemia, unspecified: Secondary | ICD-10-CM | POA: Diagnosis not present

## 2018-09-19 NOTE — Telephone Encounter (Signed)
[name] informed of MD response stated understanding. Patient drinks a lot of fluid already and a different nurse is checking on patient tomorrow but [name] will inform her of MD response as well

## 2018-09-19 NOTE — Telephone Encounter (Signed)
If happens again call back. Encourage fluids.

## 2018-09-19 NOTE — Telephone Encounter (Signed)
Copied from [place] [id number]. Topic: General - Other >> Sep 19, 2018  2:38 PM Adelene Idler wrote: Curt Bears from [facility] is calling in to report a blood pressure reading of 104/46. She also had a moment of feeling lightheaded   CB# [phone]

## 2018-09-20 DIAGNOSIS — I10 Essential (primary) hypertension: Secondary | ICD-10-CM | POA: Diagnosis not present

## 2018-09-20 DIAGNOSIS — I4891 Unspecified atrial fibrillation: Secondary | ICD-10-CM | POA: Diagnosis not present

## 2018-09-20 DIAGNOSIS — Z96641 Presence of right artificial hip joint: Secondary | ICD-10-CM | POA: Diagnosis not present

## 2018-09-20 DIAGNOSIS — E785 Hyperlipidemia, unspecified: Secondary | ICD-10-CM | POA: Diagnosis not present

## 2018-09-20 DIAGNOSIS — Z8673 Personal history of transient ischemic attack (TIA), and cerebral infarction without residual deficits: Secondary | ICD-10-CM | POA: Diagnosis not present

## 2018-09-20 DIAGNOSIS — Z7901 Long term (current) use of anticoagulants: Secondary | ICD-10-CM | POA: Diagnosis not present

## 2018-09-20 DIAGNOSIS — S72001D Fracture of unspecified part of neck of right femur, subsequent encounter for closed fracture with routine healing: Secondary | ICD-10-CM | POA: Diagnosis not present

## 2018-09-23 DIAGNOSIS — Z8673 Personal history of transient ischemic attack (TIA), and cerebral infarction without residual deficits: Secondary | ICD-10-CM | POA: Diagnosis not present

## 2018-09-23 DIAGNOSIS — I4891 Unspecified atrial fibrillation: Secondary | ICD-10-CM | POA: Diagnosis not present

## 2018-09-23 DIAGNOSIS — I10 Essential (primary) hypertension: Secondary | ICD-10-CM | POA: Diagnosis not present

## 2018-09-23 DIAGNOSIS — Z7901 Long term (current) use of anticoagulants: Secondary | ICD-10-CM | POA: Diagnosis not present

## 2018-09-23 DIAGNOSIS — Z96641 Presence of right artificial hip joint: Secondary | ICD-10-CM | POA: Diagnosis not present

## 2018-09-23 DIAGNOSIS — E785 Hyperlipidemia, unspecified: Secondary | ICD-10-CM | POA: Diagnosis not present

## 2018-09-23 DIAGNOSIS — S72001D Fracture of unspecified part of neck of right femur, subsequent encounter for closed fracture with routine healing: Secondary | ICD-10-CM | POA: Diagnosis not present

## 2018-09-24 ENCOUNTER — Telehealth: Payer: Self-pay | Admitting: Internal Medicine

## 2018-09-24 DIAGNOSIS — Z7901 Long term (current) use of anticoagulants: Secondary | ICD-10-CM | POA: Diagnosis not present

## 2018-09-24 DIAGNOSIS — S72001D Fracture of unspecified part of neck of right femur, subsequent encounter for closed fracture with routine healing: Secondary | ICD-10-CM | POA: Diagnosis not present

## 2018-09-24 DIAGNOSIS — Z96641 Presence of right artificial hip joint: Secondary | ICD-10-CM | POA: Diagnosis not present

## 2018-09-24 DIAGNOSIS — I10 Essential (primary) hypertension: Secondary | ICD-10-CM | POA: Diagnosis not present

## 2018-09-24 DIAGNOSIS — Z8673 Personal history of transient ischemic attack (TIA), and cerebral infarction without residual deficits: Secondary | ICD-10-CM | POA: Diagnosis not present

## 2018-09-24 DIAGNOSIS — E785 Hyperlipidemia, unspecified: Secondary | ICD-10-CM | POA: Diagnosis not present

## 2018-09-24 DIAGNOSIS — I4891 Unspecified atrial fibrillation: Secondary | ICD-10-CM | POA: Diagnosis not present

## 2018-09-24 NOTE — Telephone Encounter (Signed)
Ok to continue holding BP meds.

## 2018-09-24 NOTE — Telephone Encounter (Signed)
Patients daughter informed of MD response  

## 2018-09-24 NOTE — Telephone Encounter (Signed)
Copied from [place] [id number]. Topic: Quick Communication - See Telephone Encounter >> Sep 24, 2018  3:54 PM Berneta Levins wrote: CRM for notification. See Telephone encounter for: 09/24/18.  Pt's daughter, Chong Sicilian, calling in.  States pt broke her hip and had it repaired.  Since then her BP has been running lower and they have been holding her BP medication.  She wanted to make physician aware of this. Chong Sicilian can be reached at [phone]

## 2018-09-26 DIAGNOSIS — Z7901 Long term (current) use of anticoagulants: Secondary | ICD-10-CM | POA: Diagnosis not present

## 2018-09-26 DIAGNOSIS — I10 Essential (primary) hypertension: Secondary | ICD-10-CM | POA: Diagnosis not present

## 2018-09-26 DIAGNOSIS — Z96641 Presence of right artificial hip joint: Secondary | ICD-10-CM | POA: Diagnosis not present

## 2018-09-26 DIAGNOSIS — S72001D Fracture of unspecified part of neck of right femur, subsequent encounter for closed fracture with routine healing: Secondary | ICD-10-CM | POA: Diagnosis not present

## 2018-09-26 DIAGNOSIS — E785 Hyperlipidemia, unspecified: Secondary | ICD-10-CM | POA: Diagnosis not present

## 2018-09-26 DIAGNOSIS — Z8673 Personal history of transient ischemic attack (TIA), and cerebral infarction without residual deficits: Secondary | ICD-10-CM | POA: Diagnosis not present

## 2018-09-26 DIAGNOSIS — I4891 Unspecified atrial fibrillation: Secondary | ICD-10-CM | POA: Diagnosis not present

## 2018-09-30 DIAGNOSIS — Z96641 Presence of right artificial hip joint: Secondary | ICD-10-CM | POA: Diagnosis not present

## 2018-09-30 DIAGNOSIS — I4891 Unspecified atrial fibrillation: Secondary | ICD-10-CM | POA: Diagnosis not present

## 2018-09-30 DIAGNOSIS — I10 Essential (primary) hypertension: Secondary | ICD-10-CM | POA: Diagnosis not present

## 2018-09-30 DIAGNOSIS — Z8673 Personal history of transient ischemic attack (TIA), and cerebral infarction without residual deficits: Secondary | ICD-10-CM | POA: Diagnosis not present

## 2018-09-30 DIAGNOSIS — S72001D Fracture of unspecified part of neck of right femur, subsequent encounter for closed fracture with routine healing: Secondary | ICD-10-CM | POA: Diagnosis not present

## 2018-09-30 DIAGNOSIS — Z7901 Long term (current) use of anticoagulants: Secondary | ICD-10-CM | POA: Diagnosis not present

## 2018-09-30 DIAGNOSIS — E785 Hyperlipidemia, unspecified: Secondary | ICD-10-CM | POA: Diagnosis not present

## 2018-10-01 ENCOUNTER — Telehealth: Payer: Self-pay | Admitting: Internal Medicine

## 2018-10-01 NOTE — Telephone Encounter (Signed)
Patient c/o Palpitations:  High priority if patient c/o lightheadedness, shortness of breath, or chest pain  1) How long have you had palpitations/irregular HR/ Afib? Are you having the symptoms now?From Sunday night until Tuesday Morning for about 36 hrs- not at this time  2) Are you currently experiencing lightheadedness, SOB or CP? no  3) Do you have a history of afib (atrial fibrillation) or irregular heart rhythm? Yes  4) Have you checked your BP or HR? (document readings if available): Blood Pressure was normal and heart rate was not over 104  5) Are you experiencing any other symptoms? no

## 2018-10-01 NOTE — Telephone Encounter (Signed)
Spoke with daughter, DPR on file.  She states pt's granddaughter got married Saturday and pt had to walk down the aisle.  On Sunday evening while checking vitals pt's HR was 104, usually in 60's.  Daughter is a Marine scientist and states she felt pt's pulse and it was irregular so she felt she was in Afib.  Pt normally knows when she is in Afib and didn't recognize that she was in it.  BP has been fine.  Pt took Amio 100mg  Monday AM, HR at 100 at that time.  Daughter waited a little while and then gave another 100mg  when there was no improvement.  Pt felt fine the whole time, no symptoms.  Pt's daughter stopped her Amlodipine and Losartan a couple of weeks ago due to low BP and this was discussed with PCP.  Advised I will send message to Dr. Lovena Le for review.  Daughter appreciative for call.

## 2018-10-03 NOTE — Telephone Encounter (Signed)
Returned call to daughter.  Clarified the Pt is taking Amiodarone 200 mg daily, she just has been splitting in half and taking 100 mg in the am and 100 mg in the pm.  Advised that was not necessary with this med.  Discuss Pt with one episode of afib during a stressful event.  Advised this breakthrough was probably d/t stress.  Advised daughter to continue to monitor Pt for afib.  If Pt has continued breakthrough to call office and Dr. Lovena Le will review if Pt should go back up on amiodarone.  (Recently reduced to daily with holiday Sat and sun).  Daughter indicates understanding.  Thanked nurse for call.

## 2018-10-04 DIAGNOSIS — H5210 Myopia, unspecified eye: Secondary | ICD-10-CM | POA: Diagnosis not present

## 2018-11-05 DIAGNOSIS — C44619 Basal cell carcinoma of skin of left upper limb, including shoulder: Secondary | ICD-10-CM | POA: Diagnosis not present

## 2018-11-05 DIAGNOSIS — L905 Scar conditions and fibrosis of skin: Secondary | ICD-10-CM | POA: Diagnosis not present

## 2018-11-08 DIAGNOSIS — S72001D Fracture of unspecified part of neck of right femur, subsequent encounter for closed fracture with routine healing: Secondary | ICD-10-CM | POA: Diagnosis not present

## 2019-01-06 ENCOUNTER — Other Ambulatory Visit: Payer: Self-pay | Admitting: Internal Medicine

## 2019-01-09 NOTE — Progress Notes (Signed)
Subjective:   [name] is a 85 y.o. female who presents for Medicare Annual (Subsequent) preventive examination.  Review of Systems:  No ROS.  Medicare Wellness Visit. Additional risk factors are reflected in the social history.  Cardiac Risk Factors include: advanced age (>52men, >29 women);hypertension Sleep patterns: feels rested on waking, gets up 1-2 times nightly to void and sleeps 8 hours nightly.    Home Safety/Smoke Alarms: Feels safe in home. Smoke alarms in place.  Living environment; residence and Firearm Safety: 1-story house/ trailer, equipment: Walkers, Type: rollator and cane , Hydrologist, Type: Tub Seat/Chair and BSC. Lives alone a family member spends the night with her and patient has life alert, no needs for DME, good support system Seat Belt Safety/Bike Helmet: Wears seat belt.     Objective:     Vitals: BP 134/78   Pulse 62   Resp 16   Ht 5\' 1"  (1.549 m)   Wt 112 lb (50.8 kg)   SpO2 98%   BMI 21.16 kg/m   Body mass index is 21.16 kg/m.  Advanced Directives 01/10/2019 08/31/2018 08/30/2018 03/12/2017 02/14/2016 10/19/2015 10/07/2015  Does Patient Have a Medical Advance Directive? Yes Yes Yes Yes Yes Yes No  Type of Paramedic of [place];Living will [place];Living will [place];Living will;Out of facility DNR (pink MOST or yellow form) (No Data) - [place] -  Does patient want to make changes to medical advance directive? - No - Patient declined - - - - -  Copy of [place] in Chart? Yes - validated most recent copy scanned in chart (See row information) Yes - - - - -  Would patient like information on creating a medical advance directive? - - - No - Patient declined - - No - patient declined information    Tobacco Social History   Tobacco Use  Smoking Status Never Smoker  Smokeless Tobacco Never Used     Counseling given: Not Answered  Past  Medical History:  Diagnosis Date  . Atrial fibrillation ([place])   . Cervical arthritis   . COLONIC POLYPS, HX OF 09/24/2007  . H/O: hysterectomy    fibroid tumors  . Hip arthritis   . History of colonic polyps   . History of stroke   . HTN (hypertension)   . Hyperlipidemia   . Stroke ([place]) 02/2010  . SUPRAVENTRICULAR ARRHYTHMIA 12/20/2010   s/p RFCA 05/2010-Dr. Lovena Le   Past Surgical History:  Procedure Laterality Date  . ABDOMINAL HYSTERECTOMY    . BREAST LUMPECTOMY     left  . FB removal from popliteal fossae    . HIP ARTHROPLASTY Right 08/31/2018   Procedure: ARTHROPLASTY BIPOLAR HIP (HEMIARTHROPLASTY);  Surgeon: Renette Butters, MD;  Location: [facility];  Service: Orthopedics;  Laterality: Right;  . INTRACAPSULAR CATARACT EXTRACTION      Left eye Oct 18, 2009  right eye Jan '11 By Dr Era Skeen  . PILONIDAL CYST EXCISION    . TRANSTHORACIC ECHOCARDIOGRAM  2007   Family History  Problem Relation Age of Onset  . Heart attack Mother   . Stroke Mother   . Coronary artery disease Mother   . Heart disease Mother   . Cancer Father        Colon Cancer  . Diabetes Daughter   . Cancer Daughter        Leukemia   Social History   Socioeconomic History  . Marital  status: Widowed    Spouse name: Not on file  . Number of children: 6  . Years of education: Not on file  . Highest education level: Not on file  Occupational History  . Occupation: Retired    Fish farm manager: RETIRED  Social Needs  . Financial resource strain: Not hard at all  . Food insecurity:    Worry: Never true    Inability: Never true  . Transportation needs:    Medical: No    Non-medical: No  Tobacco Use  . Smoking status: Never Smoker  . Smokeless tobacco: Never Used  Substance and Sexual Activity  . Alcohol use: No    Alcohol/week: 0.0 standard drinks    Comment: rare  . Drug use: No  . Sexual activity: Never  Lifestyle  . Physical activity:    Days per week: 0 days    Minutes per session: 0 min    . Stress: Not at all  Relationships  . Social connections:    Talks on phone: More than three times a week    Gets together: More than three times a week    Attends religious service: More than 4 times per year    Active member of club or organization: Yes    Attends meetings of clubs or organizations: More than 4 times per year    Relationship status: Widowed  Other Topics Concern  . Not on file  Social History Narrative   married - 1951- widowed 02-12-2023. 4-daughters, 1 died leukemia; 2 sons; 10 grandchildren; 5 great-grandchildren. I-ADLs. Retired. Lives in her own home but one of her children is with her all of the time. ACP- No CPR, no prolonged mechanical ventilation; not to be sustained long term with artificial feeding or hydration; no chronic HD; no other heroic or futile.           Outpatient Encounter Medications as of 01/10/2019  Medication Sig  . acetaminophen (TYLENOL) 325 MG tablet Take 650 mg by mouth every 6 (six) hours as needed.  Marland Kitchen amiodarone (PACERONE) 200 MG tablet Take one tablet by mouth Monday, Tuesday, Wednesday, Thursday and Friday.  Do not take on Saturday or Sunday.  . calcium-vitamin D (OSCAL WITH D) 500-200 MG-UNIT tablet Take 1 tablet by mouth every morning.  . fluorouracil (EFUDEX) 5 % cream Apply 1 application topically 2 (two) times daily.  Marland Kitchen HYDROcodone-acetaminophen (NORCO) 5-325 MG tablet Take 1 tablet by mouth every 6 (six) hours as needed for moderate pain.  . Multiple Vitamin (MULTIVITAMIN WITH MINERALS) TABS tablet Take 1 tablet by mouth 2 (two) times daily.  . nitroGLYCERIN (NITROSTAT) 0.4 MG SL tablet Place 1 tablet (0.4 mg total) under the tongue every 5 (five) minutes as needed for chest pain (MAX 3 TABLETS).  . polyethylene glycol (MIRALAX / GLYCOLAX) packet Take 17 g by mouth daily as needed (constipation).   . Rivaroxaban (XARELTO) 15 MG TABS tablet Take 1 tablet (15 mg total) by mouth daily. Need office visit for further refills   No  facility-administered encounter medications on file as of 01/10/2019.     Activities of Daily Living In your present state of health, do you have any difficulty performing the following activities: 01/10/2019 08/31/2018  Hearing? N -  Vision? N -  Difficulty concentrating or making decisions? N -  Walking or climbing stairs? Y -  Dressing or bathing? N -  Doing errands, shopping? Y N  Preparing Food and eating ? N -  Using the Toilet? N -  In the past six months, have you accidently leaked urine? N -  Do you have problems with loss of bowel control? N -  Managing your Medications? N -  Managing your Finances? N -  Housekeeping or managing your Housekeeping? N -  Some recent data might be hidden    Patient Care Team: Hoyt Koch, MD as PCP - General (Internal Medicine)    Assessment:   This is a routine wellness examination for Tonique. Physical assessment deferred to PCP.  Exercise Activities and Dietary recommendations Current Exercise Habits: The patient does not participate in regular exercise at present, Exercise limited by: orthopedic condition(s)  Diet (meal preparation, eat out, water intake, caffeinated beverages, dairy products, fruits and vegetables): in general, a "healthy" diet  , on average, 2 meals per day. Reports poor appetite at times.    Discussed supplementing with Ensure and Boost and eating high protein foods. Encouraged patient to increase daily water and healthy fluid intake.  Goals      Patient Stated   . patient (pt-stated)     Going to a balance class; keep exercising;  Exercise with stretch bands       Other   . Patient Stated     Maintain current health status.       Fall Risk Fall Risk  01/10/2019 02/14/2016 12/23/2015 10/26/2013 10/24/2013  Falls in the past year? 1 Yes Yes - Yes  Number falls in past yr: 1 1 1 2  or more 1  Injury with Fall? 1 Yes Yes No No  Comment - 9 months ago; got off balance; to er and home with collar  - - -    Risk for fall due to : Impaired mobility;Impaired balance/gait - - History of fall(s);Impaired balance/gait;Impaired mobility Impaired balance/gait  Follow up Falls prevention discussed Education provided - - -    Depression Screen PHQ 2/9 Scores 01/10/2019 02/14/2016 12/23/2015 10/24/2013  PHQ - 2 Score 0 0 0 0     Cognitive Function MMSE - Mini Mental State Exam 01/10/2019 02/14/2016  Not completed: Refused (No Data)       Ad8 score reviewed for issues:  Issues making decisions: no  Less interest in hobbies / activities: no  Repeats questions, stories (family complaining): no  Trouble using ordinary gadgets (microwave, computer, phone):no  Forgets the month or year: no  Mismanaging finances: no  Remembering appts: no  Daily problems with thinking and/or memory: no Ad8 score is= 0  Immunization History  Administered Date(s) Administered  . Influenza Split 09/20/2011, 10/10/2012, 09/23/2013, 10/19/2015  . Influenza Whole 09/16/2008, 09/27/2009, 10/18/2010  . Influenza, High Dose Seasonal PF 10/12/2016, 09/01/2018  . Influenza,inj,Quad PF,6+ Mos 09/17/2014  . Influenza-Unspecified 09/04/2015, 09/03/2017  . Pneumococcal Conjugate-13 12/18/2013  . Pneumococcal Polysaccharide-23 12/23/2015  . Td 11/09/2009  . Tdap 04/18/2015  . Zoster 02/11/2009   Screening Tests Health Maintenance  Topic Date Due  . TETANUS/TDAP  04/17/2025  . INFLUENZA VACCINE  Completed  . DEXA SCAN  Completed  . PNA vac Low Risk Adult  Completed      Plan:   Reviewed health maintenance screenings with patient today and relevant education, vaccines, and/or referrals were provided.   Continue doing brain stimulating activities (puzzles, reading, adult coloring books, staying active) to keep memory sharp.   Continue to eat heart healthy diet (full of fruits, vegetables, whole grains, lean protein, water--limit salt, fat, and sugar intake) and increase physical activity as tolerated.  I have  personally reviewed and  noted the following in the patient's chart:   . Medical and social history . Use of alcohol, tobacco or illicit drugs  . Current medications and supplements . Functional ability and status . Nutritional status . Physical activity . Advanced directives . List of other physicians . Vitals . Screenings to include cognitive, depression, and falls . Referrals and appointments  In addition, I have reviewed and discussed with patient certain preventive protocols, quality metrics, and best practice recommendations. A written personalized care plan for preventive services as well as general preventive health recommendations were provided to patient.     Michiel Cowboy, RN  01/10/2019

## 2019-01-10 ENCOUNTER — Ambulatory Visit (INDEPENDENT_AMBULATORY_CARE_PROVIDER_SITE_OTHER): Payer: Medicare HMO | Admitting: *Deleted

## 2019-01-10 VITALS — BP 134/78 | HR 62 | Resp 16 | Ht 61.0 in | Wt 112.0 lb

## 2019-01-10 DIAGNOSIS — Z0000 Encounter for general adult medical examination without abnormal findings: Secondary | ICD-10-CM

## 2019-01-10 NOTE — Patient Instructions (Addendum)
Continue doing brain stimulating activities (puzzles, reading, adult coloring books, staying active) to keep memory sharp.   Continue to eat heart healthy diet (full of fruits, vegetables, whole grains, lean protein, water--limit salt, fat, and sugar intake) and increase physical activity as tolerated.  [name] , Thank you for taking time to come for your Medicare Wellness Visit. I appreciate your ongoing commitment to your health goals. Please review the following plan we discussed and let me know if I can assist you in the future.   These are the goals we discussed: Goals      Patient Stated   . patient (pt-stated)     Going to a balance class; keep exercising;  Exercise with stretch bands       Other   . Patient Stated     Maintain current health status.       This is a list of the screening recommended for you and due dates:  Health Maintenance  Topic Date Due  . Tetanus Vaccine  04/17/2025  . Flu Shot  Completed  . DEXA scan (bone density measurement)  Completed  . Pneumonia vaccines  Completed   Health Maintenance, Female Adopting a healthy lifestyle and getting preventive care can go a long way to promote health and wellness. Talk with your health care provider about what schedule of regular examinations is right for you. This is a good chance for you to check in with your provider about disease prevention and staying healthy. In between checkups, there are plenty of things you can do on your own. Experts have done a lot of research about which lifestyle changes and preventive measures are most likely to keep you healthy. Ask your health care provider for more information. Weight and diet Eat a healthy diet  Be sure to include plenty of vegetables, fruits, low-fat dairy products, and lean protein.  Do not eat a lot of foods high in solid fats, added sugars, or salt.  Get regular exercise. This is one of the most important things you can do for your health. ? Most  adults should exercise for at least 150 minutes each week. The exercise should increase your heart rate and make you sweat (moderate-intensity exercise). ? Most adults should also do strengthening exercises at least twice a week. This is in addition to the moderate-intensity exercise. Maintain a healthy weight  Body mass index (BMI) is a measurement that can be used to identify possible weight problems. It estimates body fat based on height and weight. Your health care provider can help determine your BMI and help you achieve or maintain a healthy weight.  For females 4 years of age and older: ? A BMI below 18.5 is considered underweight. ? A BMI of 18.5 to 24.9 is normal. ? A BMI of 25 to 29.9 is considered overweight. ? A BMI of 30 and above is considered obese. Watch levels of cholesterol and blood lipids  You should start having your blood tested for lipids and cholesterol at 83 years of age, then have this test every 5 years.  You may need to have your cholesterol levels checked more often if: ? Your lipid or cholesterol levels are high. ? You are older than 83 years of age. ? You are at high risk for heart disease. Cancer screening Lung Cancer  Lung cancer screening is recommended for adults 35-76 years old who are at high risk for lung cancer because of a history of smoking.  A yearly low-dose CT  scan of the lungs is recommended for people who: ? Currently smoke. ? Have quit within the past 15 years. ? Have at least a 30-pack-year history of smoking. A pack year is smoking an average of one pack of cigarettes a day for 1 year.  Yearly screening should continue until it has been 15 years since you quit.  Yearly screening should stop if you develop a health problem that would prevent you from having lung cancer treatment. Breast Cancer  Practice breast self-awareness. This means understanding how your breasts normally appear and feel.  It also means doing regular breast  self-exams. Let your health care provider know about any changes, no matter how small.  If you are in your 20s or 30s, you should have a clinical breast exam (CBE) by a health care provider every 1-3 years as part of a regular health exam.  If you are 48 or older, have a CBE every year. Also consider having a breast X-ray (mammogram) every year.  If you have a family history of breast cancer, talk to your health care provider about genetic screening.  If you are at high risk for breast cancer, talk to your health care provider about having an MRI and a mammogram every year.  Breast cancer gene (BRCA) assessment is recommended for women who have family members with BRCA-related cancers. BRCA-related cancers include: ? Breast. ? Ovarian. ? Tubal. ? Peritoneal cancers.  Results of the assessment will determine the need for genetic counseling and BRCA1 and BRCA2 testing. Cervical Cancer Your health care provider may recommend that you be screened regularly for cancer of the pelvic organs (ovaries, uterus, and vagina). This screening involves a pelvic examination, including checking for microscopic changes to the surface of your cervix (Pap test). You may be encouraged to have this screening done every 3 years, beginning at age 24.  For women ages 37-65, health care providers may recommend pelvic exams and Pap testing every 3 years, or they may recommend the Pap and pelvic exam, combined with testing for human papilloma virus (HPV), every 5 years. Some types of HPV increase your risk of cervical cancer. Testing for HPV may also be done on women of any age with unclear Pap test results.  Other health care providers may not recommend any screening for nonpregnant women who are considered low risk for pelvic cancer and who do not have symptoms. Ask your health care provider if a screening pelvic exam is right for you.  If you have had past treatment for cervical cancer or a condition that could lead  to cancer, you need Pap tests and screening for cancer for at least 20 years after your treatment. If Pap tests have been discontinued, your risk factors (such as having a new sexual partner) need to be reassessed to determine if screening should resume. Some women have medical problems that increase the chance of getting cervical cancer. In these cases, your health care provider may recommend more frequent screening and Pap tests. Colorectal Cancer  This type of cancer can be detected and often prevented.  Routine colorectal cancer screening usually begins at 83 years of age and continues through 83 years of age.  Your health care provider may recommend screening at an earlier age if you have risk factors for colon cancer.  Your health care provider may also recommend using home test kits to check for hidden blood in the stool.  A small camera at the end of a tube can be used to  examine your colon directly (sigmoidoscopy or colonoscopy). This is done to check for the earliest forms of colorectal cancer.  Routine screening usually begins at age 69.  Direct examination of the colon should be repeated every 5-10 years through 83 years of age. However, you may need to be screened more often if early forms of precancerous polyps or small growths are found. Skin Cancer  Check your skin from head to toe regularly.  Tell your health care provider about any new moles or changes in moles, especially if there is a change in a mole's shape or color.  Also tell your health care provider if you have a mole that is larger than the size of a pencil eraser.  Always use sunscreen. Apply sunscreen liberally and repeatedly throughout the day.  Protect yourself by wearing long sleeves, pants, a wide-brimmed hat, and sunglasses whenever you are outside. Heart disease, diabetes, and high blood pressure  High blood pressure causes heart disease and increases the risk of stroke. High blood pressure is more  likely to develop in: ? People who have blood pressure in the high end of the normal range (130-139/85-89 mm Hg). ? People who are overweight or obese. ? People who are African American.  If you are 17-67 years of age, have your blood pressure checked every 3-5 years. If you are 32 years of age or older, have your blood pressure checked every year. You should have your blood pressure measured twice-once when you are at a hospital or clinic, and once when you are not at a hospital or clinic. Record the average of the two measurements. To check your blood pressure when you are not at a hospital or clinic, you can use: ? An automated blood pressure machine at a pharmacy. ? A home blood pressure monitor.  If you are between 71 years and 88 years old, ask your health care provider if you should take aspirin to prevent strokes.  Have regular diabetes screenings. This involves taking a blood sample to check your fasting blood sugar level. ? If you are at a normal weight and have a low risk for diabetes, have this test once every three years after 83 years of age. ? If you are overweight and have a high risk for diabetes, consider being tested at a younger age or more often. Preventing infection Hepatitis B  If you have a higher risk for hepatitis B, you should be screened for this virus. You are considered at high risk for hepatitis B if: ? You were born in a country where hepatitis B is common. Ask your health care provider which countries are considered high risk. ? Your parents were born in a high-risk country, and you have not been immunized against hepatitis B (hepatitis B vaccine). ? You have HIV or AIDS. ? You use needles to inject street drugs. ? You live with someone who has hepatitis B. ? You have had sex with someone who has hepatitis B. ? You get hemodialysis treatment. ? You take certain medicines for conditions, including cancer, organ transplantation, and autoimmune  conditions. Hepatitis C  Blood testing is recommended for: ? Everyone born from [birth year] through 1963. ? Anyone with known risk factors for hepatitis C. Sexually transmitted infections (STIs)  You should be screened for sexually transmitted infections (STIs) including gonorrhea and chlamydia if: ? You are sexually active and are younger than 83 years of age. ? You are older than 83 years of age and your health care provider  tells you that you are at risk for this type of infection. ? Your sexual activity has changed since you were last screened and you are at an increased risk for chlamydia or gonorrhea. Ask your health care provider if you are at risk.  If you do not have HIV, but are at risk, it may be recommended that you take a prescription medicine daily to prevent HIV infection. This is called pre-exposure prophylaxis (PrEP). You are considered at risk if: ? You are sexually active and do not regularly use condoms or know the HIV status of your partner(s). ? You take drugs by injection. ? You are sexually active with a partner who has HIV. Talk with your health care provider about whether you are at high risk of being infected with HIV. If you choose to begin PrEP, you should first be tested for HIV. You should then be tested every 3 months for as long as you are taking PrEP. Pregnancy  If you are premenopausal and you may become pregnant, ask your health care provider about preconception counseling.  If you may become pregnant, take 400 to 800 micrograms (mcg) of folic acid every day.  If you want to prevent pregnancy, talk to your health care provider about birth control (contraception). Osteoporosis and menopause  Osteoporosis is a disease in which the bones lose minerals and strength with aging. This can result in serious bone fractures. Your risk for osteoporosis can be identified using a bone density scan.  If you are 38 years of age or older, or if you are at risk for  osteoporosis and fractures, ask your health care provider if you should be screened.  Ask your health care provider whether you should take a calcium or vitamin D supplement to lower your risk for osteoporosis.  Menopause may have certain physical symptoms and risks.  Hormone replacement therapy may reduce some of these symptoms and risks. Talk to your health care provider about whether hormone replacement therapy is right for you. Follow these instructions at home:  Schedule regular health, dental, and eye exams.  Stay current with your immunizations.  Do not use any tobacco products including cigarettes, chewing tobacco, or electronic cigarettes.  If you are pregnant, do not drink alcohol.  If you are breastfeeding, limit how much and how often you drink alcohol.  Limit alcohol intake to no more than 1 drink per day for nonpregnant women. One drink equals 12 ounces of beer, 5 ounces of , or 1 ounces of hard liquor.  Do not use street drugs.  Do not share needles.  Ask your health care provider for help if you need support or information about quitting drugs.  Tell your health care provider if you often feel depressed.  Tell your health care provider if you have ever been abused or do not feel safe at home. This information is not intended to replace advice given to you by your health care provider. Make sure you discuss any questions you have with your health care provider. Document Released: 06/05/2011 Document Revised: 04/27/2016 Document Reviewed: 08/24/2015 Elsevier Interactive Patient Education  2019 [employer].

## 2019-01-10 NOTE — Progress Notes (Signed)
Medical screening examination/treatment/procedure(s) were performed by non-physician practitioner and as supervising physician I was immediately available for consultation/collaboration. I agree with above. Elizabeth A Crawford, MD 

## 2019-04-18 ENCOUNTER — Other Ambulatory Visit (INDEPENDENT_AMBULATORY_CARE_PROVIDER_SITE_OTHER): Payer: Medicare HMO

## 2019-04-18 ENCOUNTER — Other Ambulatory Visit: Payer: Self-pay

## 2019-04-18 ENCOUNTER — Encounter: Payer: Self-pay | Admitting: Internal Medicine

## 2019-04-18 ENCOUNTER — Ambulatory Visit (INDEPENDENT_AMBULATORY_CARE_PROVIDER_SITE_OTHER): Payer: Medicare HMO | Admitting: Internal Medicine

## 2019-04-18 VITALS — BP 130/62 | HR 76 | Temp 97.9°F | Ht 61.0 in | Wt 112.0 lb

## 2019-04-18 DIAGNOSIS — Z91030 Bee allergy status: Secondary | ICD-10-CM

## 2019-04-18 DIAGNOSIS — I4821 Permanent atrial fibrillation: Secondary | ICD-10-CM

## 2019-04-18 DIAGNOSIS — R21 Rash and other nonspecific skin eruption: Secondary | ICD-10-CM | POA: Diagnosis not present

## 2019-04-18 DIAGNOSIS — E782 Mixed hyperlipidemia: Secondary | ICD-10-CM | POA: Diagnosis not present

## 2019-04-18 DIAGNOSIS — I4891 Unspecified atrial fibrillation: Secondary | ICD-10-CM | POA: Diagnosis not present

## 2019-04-18 LAB — COMPREHENSIVE METABOLIC PANEL
ALT: 15 U/L (ref 0–35)
AST: 19 U/L (ref 0–37)
Albumin: 4.4 g/dL (ref 3.5–5.2)
Alkaline Phosphatase: 68 U/L (ref 39–117)
BUN: 18 mg/dL (ref 6–23)
CO2: 25 mEq/L (ref 19–32)
Calcium: 9.9 mg/dL (ref 8.4–10.5)
Chloride: 102 mEq/L (ref 96–112)
Creatinine, Ser: 1.18 mg/dL (ref 0.40–1.20)
GFR: 43.13 mL/min — ABNORMAL LOW (ref >60.00)
Glucose, Bld: 87 mg/dL (ref 70–99)
Potassium: 4.2 mEq/L (ref 3.5–5.1)
Sodium: 138 mEq/L (ref 135–145)
Total Bilirubin: 0.4 mg/dL (ref 0.2–1.2)
Total Protein: 7.6 g/dL (ref 6.0–8.3)

## 2019-04-18 LAB — LIPID PANEL
Cholesterol: 192 mg/dL (ref 0–200)
HDL: 92 mg/dL (ref >39.00)
LDL Cholesterol: 84 mg/dL (ref 0–99)
NonHDL: 100.19
Total CHOL/HDL Ratio: 2
Triglycerides: 80 mg/dL (ref 0.0–149.0)
VLDL: 16 mg/dL (ref 0.0–40.0)

## 2019-04-18 LAB — CBC
HCT: 41.2 % (ref 36.0–46.0)
Hemoglobin: 13.9 g/dL (ref 12.0–15.0)
MCHC: 33.7 g/dL (ref 30.0–36.0)
MCV: 85.4 fl (ref 78.0–100.0)
Platelets: 345 10*3/uL (ref 150.0–400.0)
RBC: 4.83 Mil/uL (ref 3.87–5.11)
RDW: 17 % — ABNORMAL HIGH (ref 11.5–15.5)
WBC: 8.8 10*3/uL (ref 4.0–10.5)

## 2019-04-18 MED ORDER — DOXYCYCLINE HYCLATE 100 MG PO TABS: 200.0000 mg | ORAL_TABLET | Freq: Once | ORAL | 0 refills | Status: AC

## 2019-04-18 MED ORDER — EPINEPHRINE 0.3 MG/0.3ML IJ SOAJ: 0.3000 mg | INTRAMUSCULAR | 0 refills | Status: DC | PRN

## 2019-04-18 MED ORDER — DOXYCYCLINE HYCLATE 100 MG PO TABS: 200.0000 mg | ORAL_TABLET | Freq: Once | ORAL | 0 refills | Status: DC

## 2019-04-18 MED ORDER — METHYLPREDNISOLONE ACETATE 40 MG/ML IJ SUSP
40.0000 mg | Freq: Once | INTRAMUSCULAR | Status: AC
  Administered 2019-04-18: 40 mg via INTRAMUSCULAR

## 2019-04-18 NOTE — Progress Notes (Signed)
   Subjective:   Patient ID: [name], female    DOB: Jul 13, 1928, 85 y.o.   MRN: [id number]  HPI The patient is an 85 YO female coming in for potential tick bite (noticed the tick Monday and removed the next morning as it was the middle of the night, exposure likely happened over the weekend, rash on her arms and hands which is fading slightly, she thinks she got all the tick out, denies fevers or chills, overall improving rash, denies muscle aches), and her bee sting allergy (needs another epi pen as hers is expired, no recent stings), and her cholesterol (does not take medication, diet is similar, not exercising lately).   Review of Systems  Constitutional: Negative.   HENT: Negative.   Eyes: Negative.   Respiratory: Negative for cough, chest tightness and shortness of breath.   Cardiovascular: Negative for chest pain, palpitations and leg swelling.  Gastrointestinal: Negative for abdominal distention, abdominal pain, constipation, diarrhea, nausea and vomiting.  Musculoskeletal: Negative.   Skin: Positive for rash.  Neurological: Negative.   Psychiatric/Behavioral: Negative.     Objective:  Physical Exam Constitutional:      Appearance: She is well-developed.     Comments: thin  HENT:     Head: Normocephalic and atraumatic.  Neck:     Musculoskeletal: Normal range of motion.  Cardiovascular:     Rate and Rhythm: Normal rate and regular rhythm.  Pulmonary:     Effort: Pulmonary effort is normal. No respiratory distress.     Breath sounds: Normal breath sounds. No wheezing or rales.  Abdominal:     General: Bowel sounds are normal. There is no distension.     Palpations: Abdomen is soft.     Tenderness: There is no abdominal tenderness. There is no rebound.  Skin:    General: Skin is warm and dry.     Findings: Rash present.     Comments: Rash on the left forearm and right forearm extending down onto the hands with some stigmata of scratching. Left forearm with lesion  likely tick bite no remnants of tick appreciated.   Neurological:     Mental Status: She is alert and oriented to person, place, and time.     Coordination: Coordination normal.  Psychiatric:        Mood and Affect: Mood normal.        Behavior: Behavior normal.     Vitals:   04/18/19 1301  BP: 130/62  Pulse: 76  Temp: 97.9 F (36.6 C)  TempSrc: Oral  SpO2: 98%  Weight: 112 lb (50.8 kg)  Height: 5\' 1"  (1.549 m)    Assessment & Plan:  Depo-medrol 40 mg IM given at visit

## 2019-04-18 NOTE — Assessment & Plan Note (Signed)
Checking lipid panel, CBC, CMP and adjust as needed. Not on meds currently.

## 2019-04-18 NOTE — Assessment & Plan Note (Signed)
Rx for epi-pen as hers is expired.

## 2019-04-18 NOTE — Assessment & Plan Note (Signed)
Rx for doxycycline 200 mg one time dosing for prevention of lyme disease given unknown duration on skin and removed within 72 hours. Depo-medrol 40 mg IM given for the rash in the office.

## 2019-04-18 NOTE — Patient Instructions (Signed)
We have given you a shot today to help with the rash.   We have sent in the epi pen.  We have sent in a medicine to prevent lyme disease from the tick called doxycycline. Take both of the 2 pills today at the same time to prevent lyme disease.

## 2019-04-18 NOTE — Assessment & Plan Note (Signed)
Needs CBC as on xarelto, no reported bleeding.

## 2019-04-22 ENCOUNTER — Other Ambulatory Visit: Payer: Self-pay

## 2019-04-22 MED ORDER — EPINEPHRINE 0.3 MG/0.3ML IJ SOAJ: 0.3000 mg | INTRAMUSCULAR | 0 refills | Status: DC | PRN

## 2019-04-23 ENCOUNTER — Other Ambulatory Visit: Payer: Self-pay | Admitting: Internal Medicine

## 2019-04-24 ENCOUNTER — Telehealth: Payer: Self-pay

## 2019-04-24 NOTE — Telephone Encounter (Signed)
Copied from Between [id number]. Topic: General - Other >> Apr 24, 2019 10:53 AM Leward Quan A wrote: Reason for CRM: Patient daughter Shirlean Mylar called was saying that mom is doing very well since last visit for the tick bite also was curious that there was no follow up visit scheduled or requested.

## 2019-04-24 NOTE — Telephone Encounter (Signed)
Recommend 3-6 months

## 2019-04-24 NOTE — Telephone Encounter (Signed)
When would you like to see patient back?

## 2019-04-24 NOTE — Telephone Encounter (Signed)
Follow up scheduled

## 2019-05-03 ENCOUNTER — Other Ambulatory Visit: Payer: Self-pay | Admitting: Internal Medicine

## 2019-05-20 ENCOUNTER — Other Ambulatory Visit: Payer: Self-pay | Admitting: Internal Medicine

## 2019-07-11 ENCOUNTER — Other Ambulatory Visit: Payer: Self-pay | Admitting: Internal Medicine

## 2019-07-18 ENCOUNTER — Other Ambulatory Visit: Payer: Self-pay | Admitting: Internal Medicine

## 2019-07-18 MED ORDER — AMIODARONE HCL 200 MG PO TABS: ORAL_TABLET | ORAL | 0 refills | Status: DC

## 2019-07-18 NOTE — Telephone Encounter (Signed)
Pt's medication was resent to [facility] mail order pharmacy because daughter called stating that pt medication was sent in incorrectly. I resent pt's medication dispensing 75 tablets, 3 months supply until appt in October. Confirmation received.

## 2019-07-25 ENCOUNTER — Ambulatory Visit: Payer: Medicare HMO | Admitting: Internal Medicine

## 2019-07-29 ENCOUNTER — Ambulatory Visit: Payer: Medicare HMO | Admitting: Internal Medicine

## 2019-09-01 ENCOUNTER — Encounter: Payer: Self-pay | Admitting: Family

## 2019-09-01 ENCOUNTER — Ambulatory Visit (INDEPENDENT_AMBULATORY_CARE_PROVIDER_SITE_OTHER): Payer: Medicare HMO | Admitting: Family

## 2019-09-01 ENCOUNTER — Other Ambulatory Visit: Payer: Self-pay

## 2019-09-01 DIAGNOSIS — J209 Acute bronchitis, unspecified: Secondary | ICD-10-CM | POA: Diagnosis not present

## 2019-09-01 MED ORDER — BENZONATATE 100 MG PO CAPS: 100.0000 mg | ORAL_CAPSULE | Freq: Three times a day (TID) | ORAL | 0 refills | Status: DC | PRN

## 2019-09-01 MED ORDER — CEFDINIR 300 MG PO CAPS: 300.0000 mg | ORAL_CAPSULE | Freq: Two times a day (BID) | ORAL | 0 refills | Status: DC

## 2019-09-01 NOTE — Progress Notes (Signed)
[name] is a 85 y.o. female with the following history as recorded in EpicCare:  Patient Active Problem List   Diagnosis Date Noted  . Rash 04/18/2019  . Bee allergy status 04/18/2019  . Closed displaced fracture of right femoral neck ([place]) 08/31/2018  . Closed right femoral fracture ([place]) 08/30/2018  . Hyponatremia 08/30/2018  . Routine health maintenance 12/01/2011  . Stroke ([place]) 03/09/2011  . Atrial fibrillation ([place]) 03/09/2011  . Hyperlipidemia 10/09/2008  . Essential hypertension 09/24/2007    Current Outpatient Medications  Medication Sig Dispense Refill  . acetaminophen (TYLENOL) 325 MG tablet Take 650 mg by mouth every 6 (six) hours as needed.    Marland Kitchen amiodarone (PACERONE) 200 MG tablet Take one tablet by mouth Monday, Tuesday, Wednesday, Thursday and Friday.  Do not take on Saturday or Sunday.  Pt must keep upcoming appt in Oct for further refills. Thanks. 75 tablet 0  . amLODipine (NORVASC) 10 MG tablet TAKE 1 TABLET (10 MG TOTAL) BY MOUTH DAILY. 90 tablet 3  . calcium-vitamin D (OSCAL WITH D) 500-200 MG-UNIT tablet Take 1 tablet by mouth every morning.    Marland Kitchen EPINEPHrine 0.3 mg/0.3 mL IJ SOAJ injection Inject 0.3 mLs (0.3 mg total) into the muscle as needed for anaphylaxis. 2 Device 0  . losartan (COZAAR) 100 MG tablet TAKE 1 TABLET (100 MG TOTAL) BY MOUTH DAILY. 90 tablet 2  . Multiple Vitamin (MULTIVITAMIN WITH MINERALS) TABS tablet Take 1 tablet by mouth 2 (two) times daily.    . nitroGLYCERIN (NITROSTAT) 0.4 MG SL tablet Place 1 tablet (0.4 mg total) under the tongue every 5 (five) minutes as needed for chest pain (MAX 3 TABLETS). 75 tablet 1  . polyethylene glycol (MIRALAX / GLYCOLAX) packet Take 17 g by mouth daily as needed (constipation).     . Rivaroxaban (XARELTO) 15 MG TABS tablet Take 1 tablet (15 mg total) by mouth daily. Need office visit for further refills 30 tablet 2  . benzonatate (TESSALON) 100 MG capsule Take 1 capsule (100 mg total) by mouth 3  (three) times daily as needed. 20 capsule 0  . cefdinir (OMNICEF) 300 MG capsule Take 1 capsule (300 mg total) by mouth 2 (two) times daily. 20 capsule 0  . fluorouracil (EFUDEX) 5 % cream Apply 1 application topically 2 (two) times daily.     No current facility-administered medications for this visit.     Allergies: Codeine  Past Medical History:  Diagnosis Date  . Atrial fibrillation ([place])   . Cervical arthritis   . COLONIC POLYPS, HX OF 09/24/2007  . H/O: hysterectomy    fibroid tumors  . Hip arthritis   . History of colonic polyps   . History of stroke   . HTN (hypertension)   . Hyperlipidemia   . Stroke ([place]) 02/2010  . SUPRAVENTRICULAR ARRHYTHMIA 12/20/2010   s/p RFCA 05/2010-Dr. Lovena Le    Past Surgical History:  Procedure Laterality Date  . ABDOMINAL HYSTERECTOMY    . BREAST LUMPECTOMY     left  . FB removal from popliteal fossae    . HIP ARTHROPLASTY Right 08/31/2018   Procedure: ARTHROPLASTY BIPOLAR HIP (HEMIARTHROPLASTY);  Surgeon: Renette Butters, MD;  Location: [place];  Service: Orthopedics;  Laterality: Right;  . INTRACAPSULAR CATARACT EXTRACTION      Left eye Oct 18, 2009  right eye Jan '11 By Dr Era Skeen  . PILONIDAL CYST EXCISION    . TRANSTHORACIC ECHOCARDIOGRAM  2007    Family History  Problem Relation Age of Onset  . Heart attack Mother   . Stroke Mother   . Coronary artery disease Mother   . Heart disease Mother   . Cancer Father        Colon Cancer  . Diabetes Daughter   . Cancer Daughter        Leukemia    Social History   Tobacco Use  . Smoking status: Never Smoker  . Smokeless tobacco: Never Used  Substance Use Topics  . Alcohol use: No    Alcohol/week: 0.0 standard drinks    Comment: rare    Subjective:   I connected with [name] on 09/01/19 at 11:00 AM EDT by a video enabled telemedicine application and verified that I am speaking with the correct person using two identifiers. Patient, her daughter and I are on the  video visit. I am in my office/ patient is at her home.    I discussed the limitations of evaluation and management by telemedicine and the availability of in person appointments. The patient expressed understanding and agreed to proceed.  2 week history of cough/ congestion; + "wet cough"; no chest pain or shortness of breath; ran low grade fever 2 nights ago; using Mucinex with limited benefit; cough is keeping patient awake at night.     Objective:  There were no vitals filed for this visit.  General: Well developed, well nourished, in no acute distress  Head: Normocephalic and atraumatic  Lungs: Respirations unlabored;  Neurologic: Alert and oriented; speech intact; face symmetrical;   Assessment:  1. Acute bronchitis, unspecified organism     Plan:  Rx for Omnicef 300 mg bid x 10 days, Tessalon Perles 100 mg tid prn; increase fluids, rest and follow-up worse, no better.   No follow-ups on file.  No orders of the defined types were placed in this encounter.   Requested Prescriptions   Signed Prescriptions Disp Refills  . benzonatate (TESSALON) 100 MG capsule 20 capsule 0    Sig: Take 1 capsule (100 mg total) by mouth 3 (three) times daily as needed.  . cefdinir (OMNICEF) 300 MG capsule 20 capsule 0    Sig: Take 1 capsule (300 mg total) by mouth 2 (two) times daily.

## 2019-09-11 ENCOUNTER — Encounter: Payer: Self-pay | Admitting: Internal Medicine

## 2019-09-11 ENCOUNTER — Ambulatory Visit (INDEPENDENT_AMBULATORY_CARE_PROVIDER_SITE_OTHER): Payer: Medicare HMO | Admitting: Internal Medicine

## 2019-09-11 ENCOUNTER — Other Ambulatory Visit: Payer: Self-pay

## 2019-09-11 VITALS — BP 148/68 | HR 93 | Ht 61.0 in | Wt 107.0 lb

## 2019-09-11 DIAGNOSIS — I10 Essential (primary) hypertension: Secondary | ICD-10-CM

## 2019-09-11 DIAGNOSIS — I4821 Permanent atrial fibrillation: Secondary | ICD-10-CM | POA: Diagnosis not present

## 2019-09-11 DIAGNOSIS — I639 Cerebral infarction, unspecified: Secondary | ICD-10-CM | POA: Diagnosis not present

## 2019-09-11 NOTE — Patient Instructions (Signed)

## 2019-09-11 NOTE — Progress Notes (Signed)
HPI [name] returns today for ongoing evaluation and management of paroxysmal atrial fibrillation on amiodarone, remote stroke, remote SVT status post remote ablation. In addition, she has hypertension. No chest pain or shortness of breath. She has had one episode of recurrent atrial fibrillation which resulted in a visit to the emergency room. She is returned back to sinus rhythm. She denies chest pain or shortness of breath. No syncope. She has reduced her dose of xarelto to QOD.  Allergies  Allergen Reactions  . Codeine Rash    fine rash     Current Outpatient Medications  Medication Sig Dispense Refill  . acetaminophen (TYLENOL) 325 MG tablet Take 650 mg by mouth every 6 (six) hours as needed.    Marland Kitchen amiodarone (PACERONE) 200 MG tablet Take one tablet by mouth Monday, Tuesday, Wednesday, Thursday and Friday.  Do not take on Saturday or Sunday.  Pt must keep upcoming appt in Oct for further refills. Thanks. 75 tablet 0  . amLODipine (NORVASC) 10 MG tablet TAKE 1 TABLET (10 MG TOTAL) BY MOUTH DAILY. 90 tablet 3  . benzonatate (TESSALON) 100 MG capsule Take 1 capsule (100 mg total) by mouth 3 (three) times daily as needed. 20 capsule 0  . calcium-vitamin D (OSCAL WITH D) 500-200 MG-UNIT tablet Take 1 tablet by mouth every morning.    . cefdinir (OMNICEF) 300 MG capsule Take 1 capsule (300 mg total) by mouth 2 (two) times daily. 20 capsule 0  . EPINEPHrine 0.3 mg/0.3 mL IJ SOAJ injection Inject 0.3 mLs (0.3 mg total) into the muscle as needed for anaphylaxis. 2 Device 0  . fluorouracil (EFUDEX) 5 % cream Apply 1 application topically 2 (two) times daily.    Marland Kitchen losartan (COZAAR) 100 MG tablet TAKE 1 TABLET (100 MG TOTAL) BY MOUTH DAILY. 90 tablet 2  . Multiple Vitamin (MULTIVITAMIN WITH MINERALS) TABS tablet Take 1 tablet by mouth 2 (two) times daily.    . nitroGLYCERIN (NITROSTAT) 0.4 MG SL tablet Place 1 tablet (0.4 mg total) under the tongue every 5 (five) minutes as needed for  chest pain (MAX 3 TABLETS). 75 tablet 1  . polyethylene glycol (MIRALAX / GLYCOLAX) packet Take 17 g by mouth daily as needed (constipation).     . Rivaroxaban (XARELTO) 15 MG TABS tablet Take 15 mg by mouth every other day.     No current facility-administered medications for this visit.      Past Medical History:  Diagnosis Date  . Atrial fibrillation ([place])   . Cervical arthritis   . COLONIC POLYPS, HX OF 09/24/2007  . H/O: hysterectomy    fibroid tumors  . Hip arthritis   . History of colonic polyps   . History of stroke   . HTN (hypertension)   . Hyperlipidemia   . Stroke ([place]) 02/2010  . SUPRAVENTRICULAR ARRHYTHMIA 12/20/2010   s/p RFCA 05/2010-Dr. Lovena Le    ROS:   All systems reviewed and negative except as noted in the HPI.   Past Surgical History:  Procedure Laterality Date  . ABDOMINAL HYSTERECTOMY    . BREAST LUMPECTOMY     left  . FB removal from popliteal fossae    . HIP ARTHROPLASTY Right 08/31/2018   Procedure: ARTHROPLASTY BIPOLAR HIP (HEMIARTHROPLASTY);  Surgeon: Renette Butters, MD;  Location: [place];  Service: Orthopedics;  Laterality: Right;  . INTRACAPSULAR CATARACT EXTRACTION      Left eye Oct 18, 2009  right eye Jan '11 By Dr Era Skeen  .  PILONIDAL CYST EXCISION    . TRANSTHORACIC ECHOCARDIOGRAM  2007     Family History  Problem Relation Age of Onset  . Heart attack Mother   . Stroke Mother   . Coronary artery disease Mother   . Heart disease Mother   . Cancer Father        Colon Cancer  . Diabetes Daughter   . Cancer Daughter        Leukemia     Social History   Socioeconomic History  . Marital status: Widowed    Spouse name: Not on file  . Number of children: 6  . Years of education: Not on file  . Highest education level: Not on file  Occupational History  . Occupation: Retired    Fish farm manager: RETIRED  Social Needs  . Financial resource strain: Not hard at all  . Food insecurity    Worry: Never true    Inability: Never  true  . Transportation needs    Medical: No    Non-medical: No  Tobacco Use  . Smoking status: Never Smoker  . Smokeless tobacco: Never Used  Substance and Sexual Activity  . Alcohol use: No    Alcohol/week: 0.0 standard drinks    Comment: rare  . Drug use: No  . Sexual activity: Never  Lifestyle  . Physical activity    Days per week: 0 days    Minutes per session: 0 min  . Stress: Not at all  Relationships  . Social connections    Talks on phone: More than three times a week    Gets together: More than three times a week    Attends religious service: More than 4 times per year    Active member of club or organization: Yes    Attends meetings of clubs or organizations: More than 4 times per year    Relationship status: Widowed  . Intimate partner violence    Fear of current or ex partner: Not on file    Emotionally abused: Not on file    Physically abused: Not on file    Forced sexual activity: Not on file  Other Topics Concern  . Not on file  Social History Narrative   married - 1951- widowed 13-Feb-2023. 4-daughters, 1 died leukemia; 2 sons; 10 grandchildren; 5 great-grandchildren. I-ADLs. Retired. Lives in her own home but one of her children is with her all of the time. ACP- No CPR, no prolonged mechanical ventilation; not to be sustained long term with artificial feeding or hydration; no chronic HD; no other heroic or futile.            BP (!) 148/68   Pulse 93   Ht 5\' 1"  (1.549 m)   Wt 107 lb (48.5 kg)   SpO2 99%   BMI 20.22 kg/m   Physical Exam:  Well appearing NAD HEENT: Unremarkable Neck:  No JVD, no thyromegally Lymphatics:  No adenopathy Back:  No CVA tenderness Lungs:  Clear with no wheezes HEART:  Regular rate rhythm, no murmurs, no rubs, no clicks Abd:  soft, positive bowel sounds, no organomegally, no rebound, no guarding Ext:  2 plus pulses, no edema, no cyanosis, no clubbing Skin:  No rashes no nodules Neuro:  CN II through XII intact, motor  grossly intact  EKG - nsr  Assess/Plan: 1. PAF - she is maintaining NSR on low dose amiodarone which she will continue. 2. HTN - her bp is better at home. She will continue her current meds.  3. Coags - I asked her to start back taking xarelto 15 mg twice daily.  Mikle Bosworth.D.

## 2019-11-16 IMAGING — CT CT HEAD W/O CM
4 series · 15 of 47 positions shown, 17 images · non-contrast
Comparison: 10/07/2015

CLINICAL DATA: Headache, posttraumatic

EXAM:
CT HEAD WITHOUT CONTRAST
TECHNIQUE: Contiguous axial images were obtained from the base of the skull
through the vertex without intravenous contrast.

[Series 3: head without · axial · non-contrast · 0.44mm/px · z∈[-131,-11]mm · 7 of 33 slices shown, 9 images]
[im 5/33  brain]
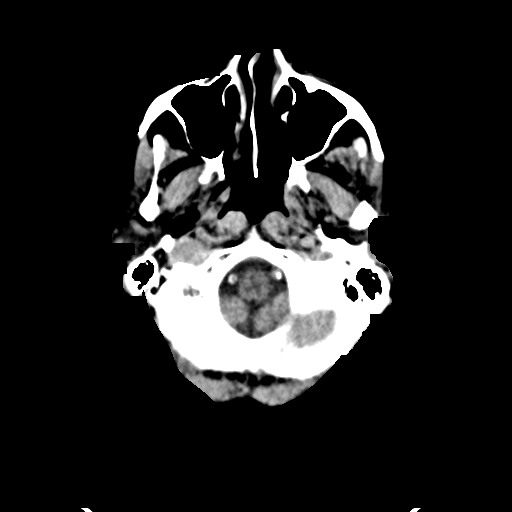
[im 5/33  bone]
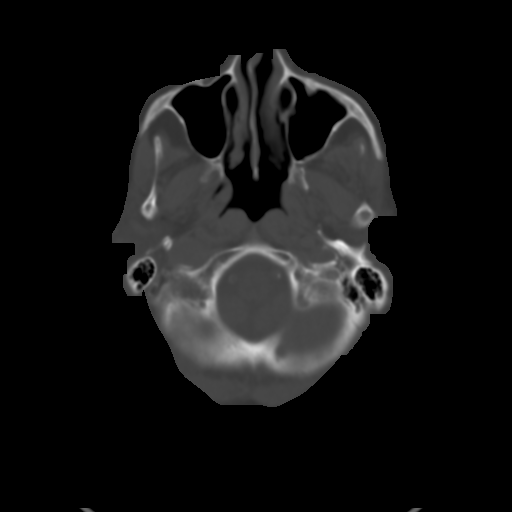
[im 9/33  brain]
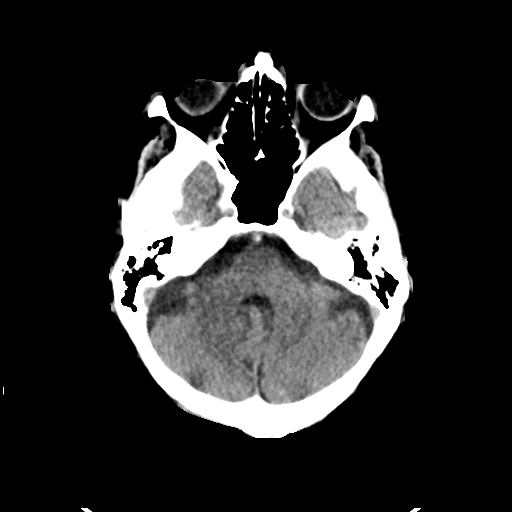
[im 13/33  brain]
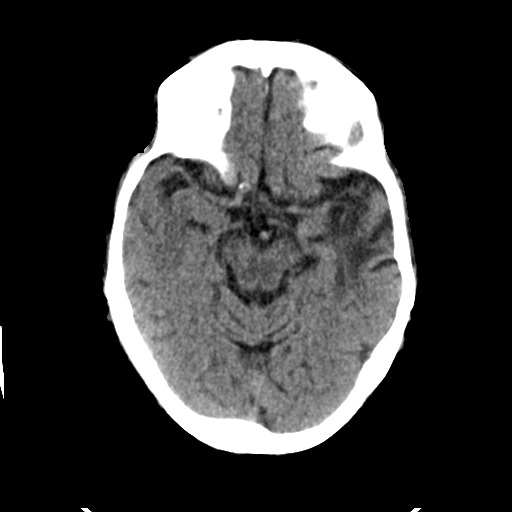
[im 17/33  brain]
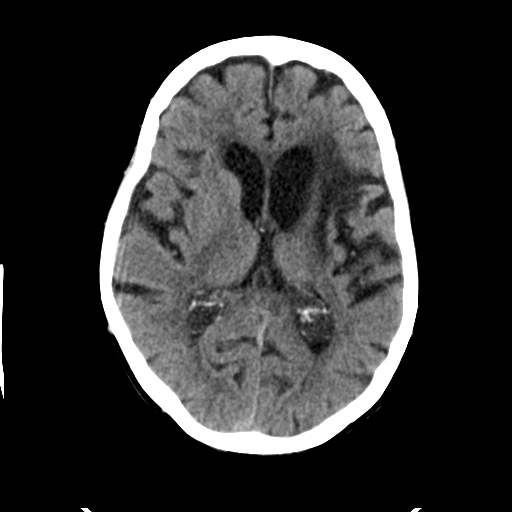
[im 21/33  brain]
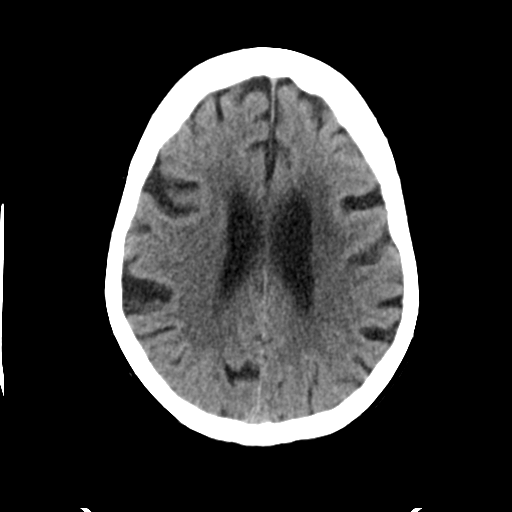
[im 21/33  bone]
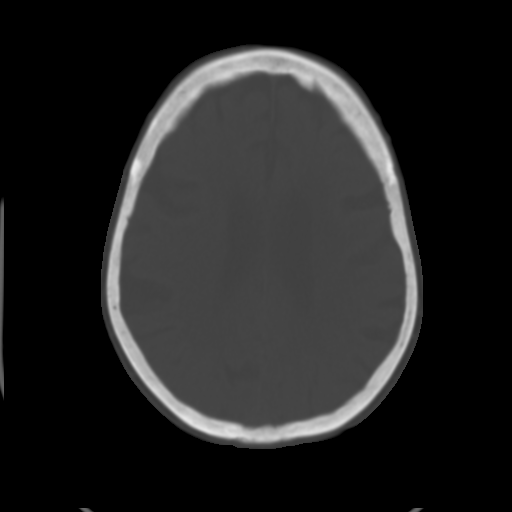
[im 25/33  brain]
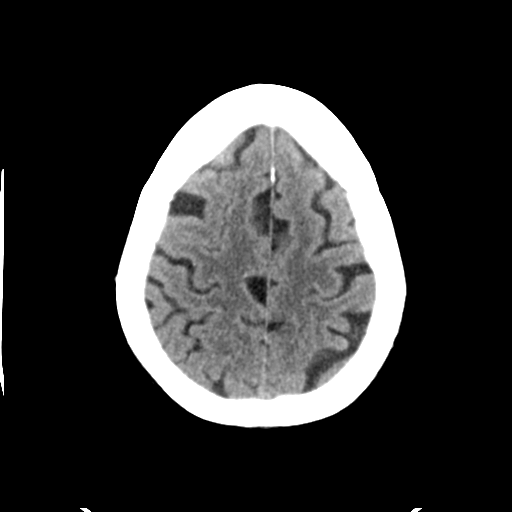
[im 29/33  brain]
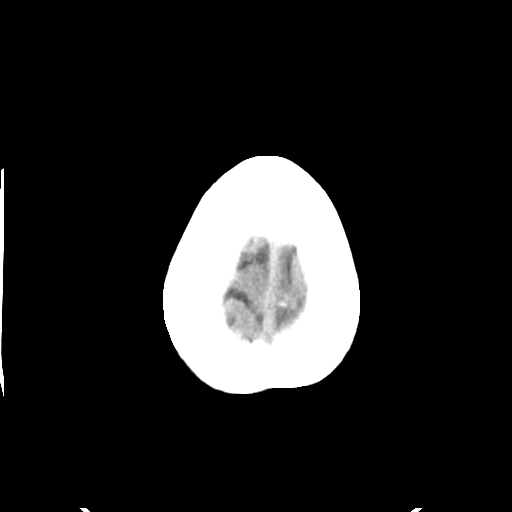

[Series 4: head bone · axial · 0.44mm/px · z∈[-135,-119]mm · 2 of 83 slices shown]
[im 9/83  bone]
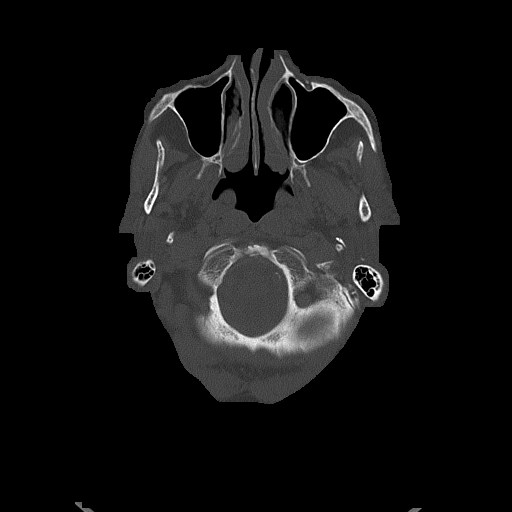
[im 17/83  bone]
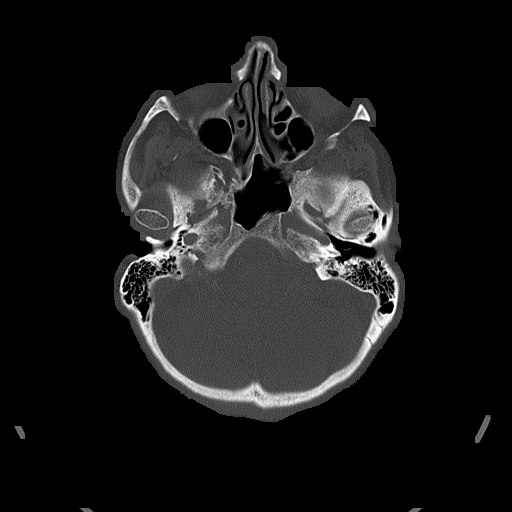

[Series 5: head without cor · coronal · non-contrast · 0.43mm/px · 3 of 70 slices shown]
[im 24/70  brain]
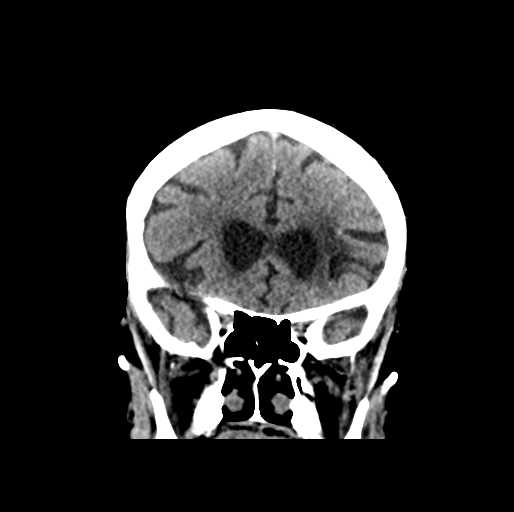
[im 31/70  brain]
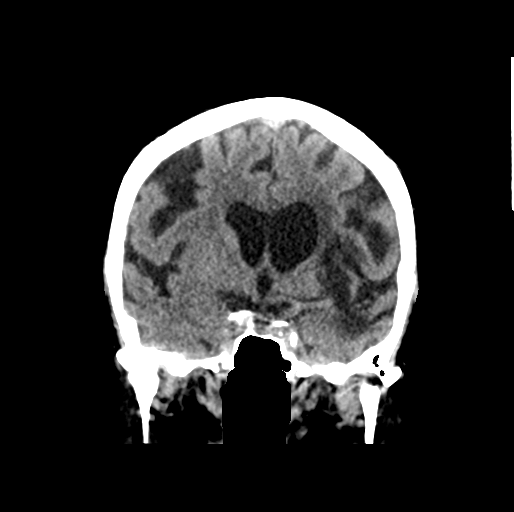
[im 39/70  brain]
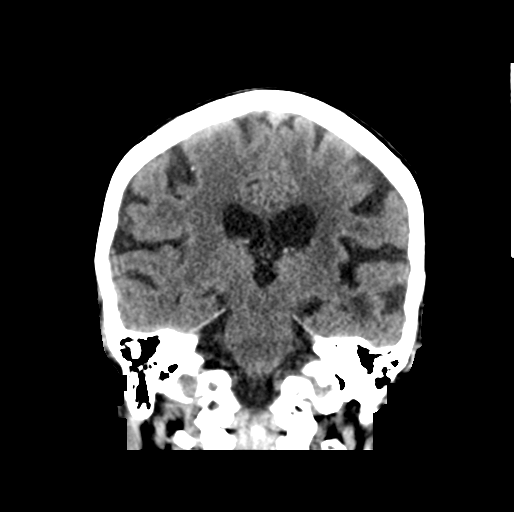

[Series 6: head without sag · sagittal · non-contrast · 0.32mm/px · 3 of 63 slices shown]
[im 21/63  brain]
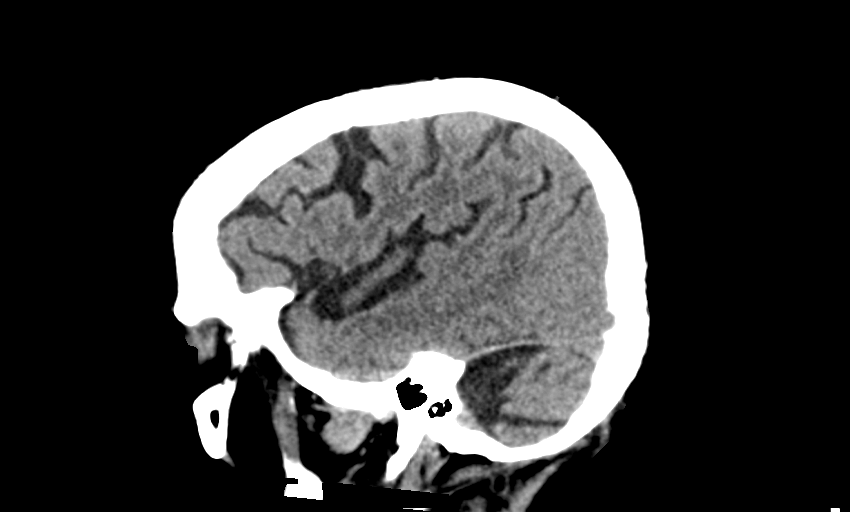
[im 32/63  brain]
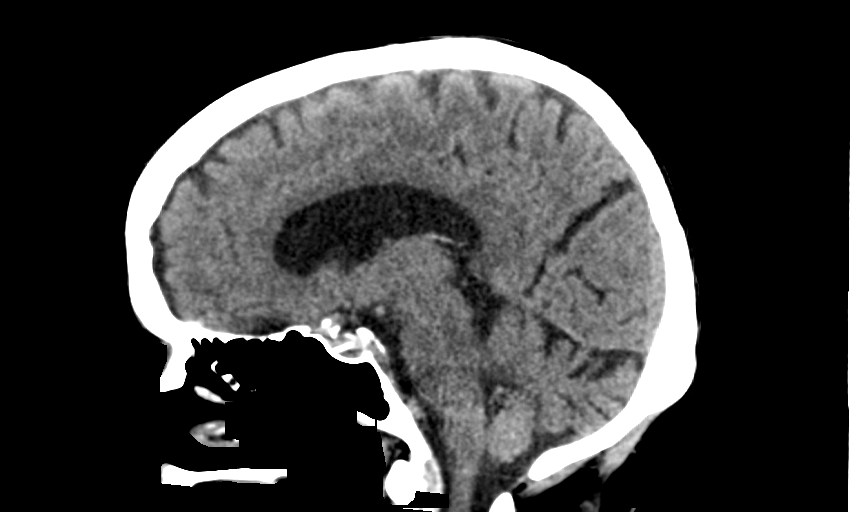
[im 42/63  brain]
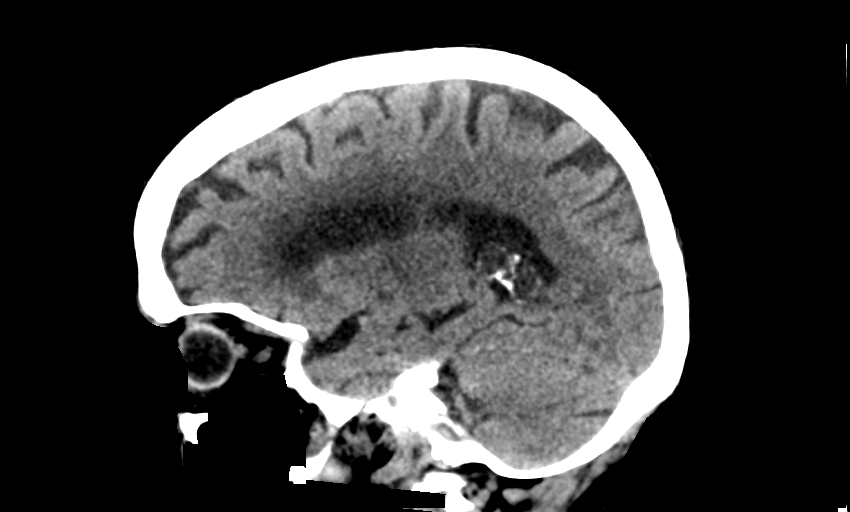

[15 of 47 positions shown; findings below may reference images not displayed]

FINDINGS: Brain: Atrophy with chronic left MCA distribution encephalomalacia
from remote infarct involving the left frontotemporal lobes. Left
basal ganglial lacunar infarct no acute intracranial hemorrhage,
midline shift or edema. Midline fourth ventricle and basal cisterns
without effacement. No intra-axial mass nor extra-axial fluid
collections. Mild ex vacuo dilatation of the adjacent left lateral
ventricle. Mild chronic small vessel ischemic disease otherwise
noted.

Vascular: No hyperdense vessel or unexpected calcification.

Skull: Normal. Negative for fracture or focal lesion.

Sinuses/Orbits: No acute finding. Status post bilateral lens
implants.

Other: None.
IMPRESSION: Left MCA distribution infarct without cephalization ex vacuo
dilatation of the left lateral ventricle as before. No acute
intracranial abnormality. Chronic small vessel ischemic disease.

## 2019-11-18 ENCOUNTER — Other Ambulatory Visit: Payer: Self-pay | Admitting: Internal Medicine

## 2019-11-18 MED ORDER — AMLODIPINE BESYLATE 10 MG PO TABS: ORAL_TABLET | ORAL | 0 refills | Status: DC

## 2019-11-18 NOTE — Telephone Encounter (Signed)
Medication Refill - Medication: amLODipine (NORVASC) 10 MG tablet    Preferred Pharmacy (with phone number or street name):  [facility] [place], [address] Phone:  [phone]  Fax:  [phone]       Agent: Please be advised that RX refills may take up to 3 business days. We ask that you follow-up with your pharmacy.

## 2019-11-27 ENCOUNTER — Other Ambulatory Visit: Payer: Self-pay | Admitting: Internal Medicine

## 2019-12-09 DIAGNOSIS — Z9849 Cataract extraction status, unspecified eye: Secondary | ICD-10-CM | POA: Diagnosis not present

## 2019-12-09 DIAGNOSIS — H5203 Hypermetropia, bilateral: Secondary | ICD-10-CM | POA: Diagnosis not present

## 2019-12-09 DIAGNOSIS — H524 Presbyopia: Secondary | ICD-10-CM | POA: Diagnosis not present

## 2019-12-09 DIAGNOSIS — Z961 Presence of intraocular lens: Secondary | ICD-10-CM | POA: Diagnosis not present

## 2019-12-09 DIAGNOSIS — H52223 Regular astigmatism, bilateral: Secondary | ICD-10-CM | POA: Diagnosis not present

## 2019-12-09 DIAGNOSIS — H35363 Drusen (degenerative) of macula, bilateral: Secondary | ICD-10-CM | POA: Diagnosis not present

## 2019-12-10 ENCOUNTER — Encounter: Payer: Medicare HMO | Admitting: Internal Medicine

## 2019-12-15 ENCOUNTER — Encounter: Payer: Self-pay | Admitting: Internal Medicine

## 2019-12-15 ENCOUNTER — Other Ambulatory Visit: Payer: Self-pay

## 2019-12-15 ENCOUNTER — Ambulatory Visit (INDEPENDENT_AMBULATORY_CARE_PROVIDER_SITE_OTHER): Payer: PPO | Admitting: Internal Medicine

## 2019-12-15 VITALS — BP 142/84 | HR 79 | Temp 98.7°F | Ht 61.0 in | Wt 115.0 lb

## 2019-12-15 DIAGNOSIS — I4821 Permanent atrial fibrillation: Secondary | ICD-10-CM

## 2019-12-15 DIAGNOSIS — E782 Mixed hyperlipidemia: Secondary | ICD-10-CM

## 2019-12-15 DIAGNOSIS — I10 Essential (primary) hypertension: Secondary | ICD-10-CM

## 2019-12-15 DIAGNOSIS — Z0000 Encounter for general adult medical examination without abnormal findings: Secondary | ICD-10-CM

## 2019-12-15 LAB — COMPREHENSIVE METABOLIC PANEL
ALT: 14 U/L (ref 0–35)
AST: 20 U/L (ref 0–37)
Albumin: 4.5 g/dL (ref 3.5–5.2)
Alkaline Phosphatase: 61 U/L (ref 39–117)
BUN: 19 mg/dL (ref 6–23)
CO2: 26 mEq/L (ref 19–32)
Calcium: 9.9 mg/dL (ref 8.4–10.5)
Chloride: 103 mEq/L (ref 96–112)
Creatinine, Ser: 1.22 mg/dL — ABNORMAL HIGH (ref 0.40–1.20)
GFR: 41.44 mL/min — ABNORMAL LOW (ref >60.00)
Glucose, Bld: 98 mg/dL (ref 70–99)
Potassium: 4 mEq/L (ref 3.5–5.1)
Sodium: 137 mEq/L (ref 135–145)
Total Bilirubin: 0.4 mg/dL (ref 0.2–1.2)
Total Protein: 7.8 g/dL (ref 6.0–8.3)

## 2019-12-15 LAB — LIPID PANEL
Cholesterol: 223 mg/dL — ABNORMAL HIGH (ref 0–200)
HDL: 102.1 mg/dL (ref >39.00)
LDL Cholesterol: 107 mg/dL — ABNORMAL HIGH (ref 0–99)
NonHDL: 121.37
Total CHOL/HDL Ratio: 2
Triglycerides: 74 mg/dL (ref 0.0–149.0)
VLDL: 14.8 mg/dL (ref 0.0–40.0)

## 2019-12-15 LAB — CBC
HCT: 40.2 % (ref 36.0–46.0)
Hemoglobin: 13.5 g/dL (ref 12.0–15.0)
MCHC: 33.6 g/dL (ref 30.0–36.0)
MCV: 87 fl (ref 78.0–100.0)
Platelets: 316 10*3/uL (ref 150.0–400.0)
RBC: 4.62 Mil/uL (ref 3.87–5.11)
RDW: 16.1 % — ABNORMAL HIGH (ref 11.5–15.5)
WBC: 8.1 10*3/uL (ref 4.0–10.5)

## 2019-12-15 NOTE — Patient Instructions (Addendum)
We are committed to keeping you informed about the COVID-19 vaccine.  As the vaccine continues to become available for each phase, we will ensure that patients who meet the criteria receive the information they need to access vaccination opportunities. Continue to check your MyChart account and [email or web address] for updates. Please review the Phase 1b information below.  Following [place]'s guidelines for the distribution of COVID-19 vaccines, we are pleased to share our plans to begin offering vaccines to those 84 and older (Phase 1b). Here are details of those plans:  [facility] COVID-19 Vaccination [facility] . Appointments required. [facility] Open to those age 4 or older . Not restricted to [facility] or [facility] residents . Location: [place], [place], [place]  . Offered daily beginning: Saturday, December 13, 2019 . Hours: 8 a.m. to 1 p.m. (10 a.m. to 2 p.m. this Saturday and Sunday, Jan. 9 and 10) . Registration for vaccine clinic appointments is open as of 10 a.m., Friday, January 8 . Please visit [email or web address] to register or call [phone] . There will be no copay required for the vaccine. Insurance information will be requested if available.  [place] will offer this vaccination clinic through Sunday, January 17, after which we will switch to a larger location to offer public vaccination at a larger scale following state guidelines. We will provide additional information as details become available.   Also, people who are 84 years and older can sign up to get vaccinated against COVID-19 at clinics that begin Monday through the [facility]. The vaccinations are open to all in that age group, regardless of their health condition or living situation.  Appointments are required and can be made beginning at 8 a.m. Friday by calling [phone] and selecting option 2. Walk-ins will not be  accepted. Clinic locations are: Mar[facility], [place]; [facility] Kitchen [address] [place]; . [place] at [facility], [address], [id number], [place].  Participants are asked to wear a face covering at vaccination sites. Visit [email or web address] and click on the "[id number] Vaccine Info" rectangle for more information about vaccinations.  In addition to the clinics above, we are working in partnership with county health agencies in [place], [place], [place] and [place] counties to ensure continuing vaccination availability in alignment with state guidelines in the weeks and months ahead.   Information on phase 1b COVID-19 vaccination clinics being offered by local county health agencies is provided in the website links below for your convenience:   . [place]  . [place]  . [place] . [place]  . [place]'s phase 1b vaccination guidelines, prioritizing those 84 and over as the next eligible group to receive the COVID-19 vaccine, are detailed at [email or web address].   Additional information: Those 84 and older who register for [place]'s COVID-19 vaccination clinic at [facility] will be provided with additional information, including the need to be observed for 15 minutes following vaccination for your safety. Our COVID-19 testing clinic will not start at this site until 2 p.m. daily to ensure no people arriving for vaccination intersect with those seeking a COVID-19 test. [place] rooms at the [facility] are clean and safe, and our staff will use all appropriate protective equipment to keep you safe. This vaccine clinic will be located in a completely separate area of this facility  than where health care is provided. No interaction will occur between patients or care teams at this  site and our vaccination clinic.   As we proceed through phases of the vaccine rollout, we remind everyone to remain vigilant in practicing the 3 W's - wear a mask, wash your hands and wait 6 feet apart from others. These safety practices are based in science and are the best tool we have to reduce the spread of the virus.   For our most current information, please visit [email or web address].  Health Maintenance, Female Adopting a healthy lifestyle and getting preventive care are important in promoting health and wellness. Ask your health care provider about:  The right schedule for you to have regular tests and exams.  Things you can do on your own to prevent diseases and keep yourself healthy. What should I know about diet, weight, and exercise? Eat a healthy diet   Eat a diet that includes plenty of vegetables, fruits, low-fat dairy products, and lean protein.  Do not eat a lot of foods that are high in solid fats, added sugars, or sodium. Maintain a healthy weight Body mass index (BMI) is used to identify weight problems. It estimates body fat based on height and weight. Your health care provider can help determine your BMI and help you achieve or maintain a healthy weight. Get regular exercise Get regular exercise. This is one of the most important things you can do for your health. Most adults should:  Exercise for at least 150 minutes each week. The exercise should increase your heart rate and make you sweat (moderate-intensity exercise).  Do strengthening exercises at least twice a week. This is in addition to the moderate-intensity exercise.  Spend less time sitting. Even light physical activity can be beneficial. Watch cholesterol and blood lipids Have your blood tested for lipids and cholesterol at 84 years of age, then have this test every 5 years. Have your cholesterol levels checked more often if:  Your lipid or cholesterol levels are high.  You are older  than 84 years of age.  You are at high risk for heart disease. What should I know about cancer screening? Depending on your health history and family history, you may need to have cancer screening at various ages. This may include screening for:  Breast cancer.  Cervical cancer.  Colorectal cancer.  Skin cancer.  Lung cancer. What should I know about heart disease, diabetes, and high blood pressure? Blood pressure and heart disease  High blood pressure causes heart disease and increases the risk of stroke. This is more likely to develop in people who have high blood pressure readings, are of African descent, or are overweight.  Have your blood pressure checked: ? Every 3-5 years if you are 52-42 years of age. ? Every year if you are 51 years old or older. Diabetes Have regular diabetes screenings. This checks your fasting blood sugar level. Have the screening done:  Once every three years after age 97 if you are at a normal weight and have a low risk for diabetes.  More often and at a younger age if you are overweight or have a high risk for diabetes. What should I know about preventing infection? Hepatitis B If you have a higher risk for hepatitis B, you should be screened for this virus. Talk with your health care provider to find out if you are at risk for hepatitis B infection. Hepatitis C Testing is recommended for:  Everyone born from [birth year]  through 1965.  Anyone with known risk factors for hepatitis C. Sexually transmitted infections (STIs)  Get screened for STIs, including gonorrhea and chlamydia, if: ? You are sexually active and are younger than 84 years of age. ? You are older than 84 years of age and your health care provider tells you that you are at risk for this type of infection. ? Your sexual activity has changed since you were last screened, and you are at increased risk for chlamydia or gonorrhea. Ask your health care provider if you are at risk.  Ask  your health care provider about whether you are at high risk for HIV. Your health care provider may recommend a prescription medicine to help prevent HIV infection. If you choose to take medicine to prevent HIV, you should first get tested for HIV. You should then be tested every 3 months for as long as you are taking the medicine. Pregnancy  If you are about to stop having your period (premenopausal) and you may become pregnant, seek counseling before you get pregnant.  Take 400 to 800 micrograms (mcg) of folic acid every day if you become pregnant.  Ask for birth control (contraception) if you want to prevent pregnancy. Osteoporosis and menopause Osteoporosis is a disease in which the bones lose minerals and strength with aging. This can result in bone fractures. If you are 38 years old or older, or if you are at risk for osteoporosis and fractures, ask your health care provider if you should:  Be screened for bone loss.  Take a calcium or vitamin D supplement to lower your risk of fractures.  Be given hormone replacement therapy (HRT) to treat symptoms of menopause. Follow these instructions at home: Lifestyle  Do not use any products that contain nicotine or tobacco, such as cigarettes, e-cigarettes, and chewing tobacco. If you need help quitting, ask your health care provider.  Do not use street drugs.  Do not share needles.  Ask your health care provider for help if you need support or information about quitting drugs. Alcohol use  Do not drink alcohol if: ? Your health care provider tells you not to drink. ? You are pregnant, may be pregnant, or are planning to become pregnant.  If you drink alcohol: ? Limit how much you use to 0-1 drink a day. ? Limit intake if you are breastfeeding.  Be aware of how much alcohol is in your drink. In the [place] one drink equals one 12 oz bottle of beer (355 mL), one 5 oz glass of wine (148 mL), or one 1 oz glass of hard liquor (44  mL). General instructions  Schedule regular health, dental, and eye exams.  Stay current with your vaccines.  Tell your health care provider if: ? You often feel depressed. ? You have ever been abused or do not feel safe at home. Summary  Adopting a healthy lifestyle and getting preventive care are important in promoting health and wellness.  Follow your health care provider's instructions about healthy diet, exercising, and getting tested or screened for diseases.  Follow your health care provider's instructions on monitoring your cholesterol and blood pressure. This information is not intended to replace advice given to you by your health care provider. Make sure you discuss any questions you have with your health care provider. Document Revised: 11/13/2018 Document Reviewed: 11/13/2018 Elsevier Patient Education  2020 [employer].

## 2019-12-15 NOTE — Progress Notes (Signed)
   Subjective:   Patient ID: [name], female    DOB: Jun 22, 1928, 86 y.o.   MRN: [id number]  HPI  The patient is an 86 YO female coming in for physical.   PMH, [place], social history reviewed and updated  Review of Systems  Constitutional: Negative.   HENT: Negative.   Eyes: Negative.   Respiratory: Negative for cough, chest tightness and shortness of breath.   Cardiovascular: Negative for chest pain, palpitations and leg swelling.  Gastrointestinal: Negative for abdominal distention, abdominal pain, constipation, diarrhea, nausea and vomiting.  Musculoskeletal: Negative.   Skin: Negative.   Neurological: Negative.        Memory changes  Psychiatric/Behavioral: Negative.     Objective:  Physical Exam Constitutional:      Appearance: She is well-developed.     Comments: Slightly frail appearing  HENT:     Head: Normocephalic and atraumatic.  Cardiovascular:     Rate and Rhythm: Normal rate and regular rhythm.  Pulmonary:     Effort: Pulmonary effort is normal. No respiratory distress.     Breath sounds: Normal breath sounds. No wheezing or rales.  Abdominal:     General: Bowel sounds are normal. There is no distension.     Palpations: Abdomen is soft.     Tenderness: There is no abdominal tenderness. There is no rebound.  Musculoskeletal:        General: No tenderness.     Cervical back: Normal range of motion.  Skin:    General: Skin is warm and dry.  Neurological:     Mental Status: She is alert and oriented to person, place, and time.     Coordination: Coordination abnormal.     Comments: Uses cane     Vitals:   12/15/19 1056  BP: (!) 142/84  Pulse: 79  Temp: 98.7 F (37.1 C)  TempSrc: Oral  SpO2: 98%  Weight: 115 lb (52.2 kg)  Height: 5\' 1"  (1.549 m)    This visit occurred during the SARS-CoV-2 public health emergency.  Safety protocols were in place, including screening questions prior to the visit, additional usage of staff PPE, and extensive  cleaning of exam room while observing appropriate contact time as indicated for disinfecting solutions.   Assessment & Plan:

## 2019-12-16 NOTE — Assessment & Plan Note (Signed)
Taking xarelto, amiodarone. Rate controlled sounds regular on exam but EKG not done.

## 2019-12-16 NOTE — Assessment & Plan Note (Signed)
Checking lipid panel and goal LDL <70 given past stroke. Not currently on statin.

## 2019-12-16 NOTE — Assessment & Plan Note (Signed)
Flu shot up to date. Pneumonia complete. Shingrix counseled. Tetanus due 2026. Colonoscopy aged out. Mammogram aged out, pap smear aged out and dexa due 2021 wishes to push back to 2022 due to covid. Counseled about sun safety and mole surveillance. Counseled about the dangers of distracted driving. Given 10 year screening recommendations.

## 2019-12-16 NOTE — Assessment & Plan Note (Signed)
Taking losartan, amlodipine with BP at goal. Checking CMP and adjust as needed.

## 2019-12-23 DIAGNOSIS — H35373 Puckering of macula, bilateral: Secondary | ICD-10-CM | POA: Diagnosis not present

## 2019-12-23 DIAGNOSIS — H353122 Nonexudative age-related macular degeneration, left eye, intermediate dry stage: Secondary | ICD-10-CM | POA: Diagnosis not present

## 2019-12-23 DIAGNOSIS — H35033 Hypertensive retinopathy, bilateral: Secondary | ICD-10-CM | POA: Diagnosis not present

## 2019-12-23 DIAGNOSIS — H353211 Exudative age-related macular degeneration, right eye, with active choroidal neovascularization: Secondary | ICD-10-CM | POA: Diagnosis not present

## 2019-12-24 ENCOUNTER — Other Ambulatory Visit: Payer: Self-pay | Admitting: Internal Medicine

## 2019-12-24 MED ORDER — AMLODIPINE BESYLATE 10 MG PO TABS: ORAL_TABLET | ORAL | 1 refills | Status: DC

## 2019-12-24 MED ORDER — RIVAROXABAN 15 MG PO TABS: 15.0000 mg | ORAL_TABLET | ORAL | 0 refills | Status: DC

## 2019-12-24 NOTE — Telephone Encounter (Signed)
Copied from [place] [id number]. Topic: Quick Communication - Rx Refill/Question >> Dec 24, 2019  4:48 PM Mcneil, Ja-Kwan wrote: Medication: amLODipine (NORVASC) 10 MG tablet and Rivaroxaban (XARELTO) 15 MG TABS tablet  Has the patient contacted their pharmacy? no  Preferred Pharmacy (with phone number or street name): [facility]) - [place], [place]  Phone: [phone] Fax: [phone]  Agent: Please be advised that RX refills may take up to 3 business days. We ask that you follow-up with your pharmacy.

## 2019-12-29 ENCOUNTER — Telehealth: Payer: Self-pay | Admitting: Internal Medicine

## 2019-12-29 NOTE — Telephone Encounter (Signed)
This is a medical error. Xarelto should never be prescribed for every other day. We need to close loop with whoever sent this medication in.

## 2019-12-29 NOTE — Telephone Encounter (Signed)
States Rx for xarelto was sent in to mail order.  But should have been for every day and not every other day.  Please follow up in regard.

## 2019-12-29 NOTE — Telephone Encounter (Signed)
Understood and noted.

## 2019-12-29 NOTE — Telephone Encounter (Signed)
We need to call in corrected rx as well for 1 pill daily please.

## 2019-12-29 NOTE — Telephone Encounter (Signed)
Xarelto was reordered per the medication list. The previous prescription was from 09/11/19 with the instructions to take my mouth every other day. Previously ordered by another provider. Apologies that medication was sent in with the incorrect instructions.

## 2019-12-29 NOTE — Telephone Encounter (Signed)
noted 

## 2019-12-29 NOTE — Telephone Encounter (Signed)
You should not be reordering medications not ordered by the provider without review please.

## 2019-12-30 ENCOUNTER — Telehealth: Payer: Self-pay

## 2019-12-30 MED ORDER — RIVAROXABAN 15 MG PO TABS: 15.0000 mg | ORAL_TABLET | Freq: Every day | ORAL | 1 refills | Status: DC

## 2019-12-30 NOTE — Addendum Note (Signed)
Addended by: Pricilla Holm A on: 12/30/2019 03:28 PM   Modules accepted: Orders

## 2019-12-30 NOTE — Telephone Encounter (Signed)
Sent in, for daily. This was the medication error.

## 2019-12-30 NOTE — Telephone Encounter (Signed)
New message   Direction needs to say - Daily   Asking for call back    1. Which medications need to be refilled? (please list name of each medication and dose if known) Rivaroxaban (XARELTO) 15 MG TABS tablet   2. Which pharmacy/location (including street and city if local pharmacy) is medication to be sent to? [facility]   3. Do they need a 30 day or 90 day supply? 90 days supply

## 2020-01-20 DIAGNOSIS — H353211 Exudative age-related macular degeneration, right eye, with active choroidal neovascularization: Secondary | ICD-10-CM | POA: Diagnosis not present

## 2020-01-20 DIAGNOSIS — H353122 Nonexudative age-related macular degeneration, left eye, intermediate dry stage: Secondary | ICD-10-CM | POA: Diagnosis not present

## 2020-02-15 ENCOUNTER — Other Ambulatory Visit: Payer: Self-pay | Admitting: Internal Medicine

## 2020-02-17 DIAGNOSIS — H353122 Nonexudative age-related macular degeneration, left eye, intermediate dry stage: Secondary | ICD-10-CM | POA: Diagnosis not present

## 2020-02-17 DIAGNOSIS — H353211 Exudative age-related macular degeneration, right eye, with active choroidal neovascularization: Secondary | ICD-10-CM | POA: Diagnosis not present

## 2020-02-27 ENCOUNTER — Other Ambulatory Visit: Payer: Self-pay

## 2020-02-27 MED ORDER — LOSARTAN POTASSIUM 100 MG PO TABS: ORAL_TABLET | ORAL | 2 refills | Status: DC

## 2020-02-27 MED ORDER — RIVAROXABAN 15 MG PO TABS: 15.0000 mg | ORAL_TABLET | Freq: Every day | ORAL | 2 refills | Status: DC

## 2020-02-27 NOTE — Telephone Encounter (Signed)
Reviewed chart pt is up-to-date sent refills to pof.../lmb  

## 2020-02-27 NOTE — Telephone Encounter (Signed)
**  Needing a 90 day supply**  Medication Refill - Medication: Rivaroxaban (XARELTO) 15 MG TABS tablet  losartan (COZAAR) 100 MG tablet   Has the patient contacted their pharmacy? Yes.   (Agent: If no, request that the patient contact the pharmacy for the refill.) (Agent: If yes, when and what did the pharmacy advise?)  Preferred Pharmacy (with phone number or street name): [facility] ([place], [place]  Agent: Please be advised that RX refills may take up to 3 business days. We ask that you follow-up with your pharmacy.

## 2020-03-02 DIAGNOSIS — H353211 Exudative age-related macular degeneration, right eye, with active choroidal neovascularization: Secondary | ICD-10-CM | POA: Diagnosis not present

## 2020-03-02 DIAGNOSIS — H353122 Nonexudative age-related macular degeneration, left eye, intermediate dry stage: Secondary | ICD-10-CM | POA: Diagnosis not present

## 2020-04-13 DIAGNOSIS — H35033 Hypertensive retinopathy, bilateral: Secondary | ICD-10-CM | POA: Diagnosis not present

## 2020-04-13 DIAGNOSIS — H35372 Puckering of macula, left eye: Secondary | ICD-10-CM | POA: Diagnosis not present

## 2020-04-13 DIAGNOSIS — H353211 Exudative age-related macular degeneration, right eye, with active choroidal neovascularization: Secondary | ICD-10-CM | POA: Diagnosis not present

## 2020-04-13 DIAGNOSIS — H353122 Nonexudative age-related macular degeneration, left eye, intermediate dry stage: Secondary | ICD-10-CM | POA: Diagnosis not present

## 2020-04-23 ENCOUNTER — Telehealth: Payer: Self-pay | Admitting: Internal Medicine

## 2020-04-23 NOTE — Progress Notes (Signed)
  Chronic Care Management   Outreach Note  04/23/2020 Name: [name] MRN: [id number] DOB: [birth year]  Referred by: Hoyt Koch, MD Reason for referral : No chief complaint on file.   An unsuccessful telephone outreach was attempted today. The patient was referred to the pharmacist for assistance with care management and care coordination.   This note is not being shared with the patient for the following reason: To respect privacy (The patient or proxy has requested that the information not be shared).  Follow Up Plan:   Earney [place] Upstream Scheduler

## 2020-05-17 ENCOUNTER — Telehealth: Payer: Self-pay | Admitting: Internal Medicine

## 2020-05-17 NOTE — Progress Notes (Signed)
  Chronic Care Management   Outreach Note  05/17/2020 Name: [name] MRN: [id number] DOB: 09/08/1928  Referred by: Hoyt Koch, MD Reason for referral : No chief complaint on file.   An unsuccessful telephone outreach was attempted today. The patient was referred to the pharmacist for assistance with care management and care coordination. This note is not being shared with the patient for the following reason: To respect privacy (The patient or proxy has requested that the information not be shared).  Follow Up Plan:   Earney Hamburg Upstream Scheduler

## 2020-06-02 ENCOUNTER — Telehealth: Payer: Self-pay | Admitting: Internal Medicine

## 2020-06-02 NOTE — Progress Notes (Signed)
  Chronic Care Management   Outreach Note  06/02/2020 Name: [name] MRN: [id number] DOB: January 24, 1928  Referred by: Hoyt Koch, MD Reason for referral : No chief complaint on file.   An unsuccessful telephone outreach was attempted today. The patient was referred to the pharmacist for assistance with care management and care coordination. This note is not being shared with the patient for the following reason: To respect privacy (The patient or proxy has requested that the information not be shared).  Follow Up Plan:   Earney [place] Upstream Scheduler

## 2020-06-11 ENCOUNTER — Other Ambulatory Visit: Payer: Self-pay | Admitting: Internal Medicine

## 2020-06-11 MED ORDER — AMIODARONE HCL 200 MG PO TABS: ORAL_TABLET | ORAL | 0 refills | Status: DC

## 2020-06-11 NOTE — Telephone Encounter (Signed)
Pt's medication was sent to pt's pharmacy as requested. Confirmation received.  °

## 2020-06-11 NOTE — Telephone Encounter (Signed)
*  STAT* If patient is at the pharmacy, call can be transferred to refill team.   1. Which medications need to be refilled? (please list name of each medication and dose if known)  amiodarone (PACERONE) 200 MG tablet  2. Which pharmacy/location (including street and city if local pharmacy) is medication to be sent to? [facility] - [place], [place]  3. Do they need a 30 day or 90 day supply? 90 day supply

## 2020-06-15 DIAGNOSIS — H353122 Nonexudative age-related macular degeneration, left eye, intermediate dry stage: Secondary | ICD-10-CM | POA: Diagnosis not present

## 2020-06-15 DIAGNOSIS — H353211 Exudative age-related macular degeneration, right eye, with active choroidal neovascularization: Secondary | ICD-10-CM | POA: Diagnosis not present

## 2020-08-04 ENCOUNTER — Telehealth: Payer: Self-pay | Admitting: Internal Medicine

## 2020-08-04 ENCOUNTER — Other Ambulatory Visit: Payer: Self-pay

## 2020-08-04 MED ORDER — LOSARTAN POTASSIUM 100 MG PO TABS: ORAL_TABLET | ORAL | 1 refills | Status: DC

## 2020-08-04 MED ORDER — RIVAROXABAN 15 MG PO TABS: 15.0000 mg | ORAL_TABLET | Freq: Every day | ORAL | 1 refills | Status: DC

## 2020-08-04 MED ORDER — AMLODIPINE BESYLATE 10 MG PO TABS: ORAL_TABLET | ORAL | 1 refills | Status: DC

## 2020-08-04 MED ORDER — AMIODARONE HCL 200 MG PO TABS: ORAL_TABLET | ORAL | 0 refills | Status: DC

## 2020-08-04 NOTE — Telephone Encounter (Signed)
Pt's medication was sent to pt's pharmacy as requested. Confirmation received.  °

## 2020-08-04 NOTE — Telephone Encounter (Signed)
New Message:   1.Medication Requested: losartan (COZAAR) 100 MG tablet amLODipine (NORVASC) 10 MG tablet Rivaroxaban (XARELTO) 15 MG TABS tablet 2. Pharmacy (Name, [facility] [place]): [facility] - [place], [place] 3. On Med List: Yes  4. Last Visit with PCP: 12/15/19  5. Next visit date with PCP: None   Agent: Please be advised that RX refills may take up to 3 business days. We ask that you follow-up with your pharmacy.

## 2020-09-07 DIAGNOSIS — H353211 Exudative age-related macular degeneration, right eye, with active choroidal neovascularization: Secondary | ICD-10-CM | POA: Diagnosis not present

## 2020-09-07 DIAGNOSIS — H35372 Puckering of macula, left eye: Secondary | ICD-10-CM | POA: Diagnosis not present

## 2020-09-07 DIAGNOSIS — H353122 Nonexudative age-related macular degeneration, left eye, intermediate dry stage: Secondary | ICD-10-CM | POA: Diagnosis not present

## 2020-09-18 ENCOUNTER — Ambulatory Visit: Payer: PPO | Attending: Internal Medicine

## 2020-09-18 DIAGNOSIS — Z23 Encounter for immunization: Secondary | ICD-10-CM

## 2020-09-18 NOTE — Progress Notes (Signed)
   Covid-19 Vaccination Clinic  Name:  [name]    MRN: [id number] DOB: Sep 16, 1928  09/18/2020  [name] was observed post Covid-19 immunization for 15 minutes without incident. She was provided with Vaccine Information Sheet and instruction to access the V-Safe system.   [name] was instructed to call 911 with any severe reactions post vaccine: [facility] Difficulty breathing  . Swelling of face and throat  . A fast heartbeat  . A bad rash all over body  . Dizziness and weakness

## 2020-10-04 ENCOUNTER — Ambulatory Visit: Payer: PPO | Admitting: Internal Medicine

## 2020-10-04 DIAGNOSIS — M545 Low back pain, unspecified: Secondary | ICD-10-CM | POA: Diagnosis not present

## 2020-10-04 DIAGNOSIS — M25551 Pain in right hip: Secondary | ICD-10-CM | POA: Diagnosis not present

## 2020-11-02 DIAGNOSIS — H35033 Hypertensive retinopathy, bilateral: Secondary | ICD-10-CM | POA: Diagnosis not present

## 2020-11-02 DIAGNOSIS — H35372 Puckering of macula, left eye: Secondary | ICD-10-CM | POA: Diagnosis not present

## 2020-11-02 DIAGNOSIS — H353122 Nonexudative age-related macular degeneration, left eye, intermediate dry stage: Secondary | ICD-10-CM | POA: Diagnosis not present

## 2020-11-02 DIAGNOSIS — H353211 Exudative age-related macular degeneration, right eye, with active choroidal neovascularization: Secondary | ICD-10-CM | POA: Diagnosis not present

## 2020-11-22 ENCOUNTER — Telehealth: Payer: Self-pay | Admitting: Internal Medicine

## 2020-11-22 ENCOUNTER — Other Ambulatory Visit: Payer: Self-pay

## 2020-11-22 MED ORDER — AMIODARONE HCL 200 MG PO TABS: ORAL_TABLET | ORAL | 0 refills | Status: DC

## 2020-11-22 NOTE — Telephone Encounter (Signed)
Pt c/o medication issue:  1. Name of Medication: amiodarone (PACERONE) 200 MG tablet  2. How are you currently taking this medication (dosage and times per day)? As directed  3. Are you having a reaction (difficulty breathing--STAT)? No   4. What is your medication issue? Patient's daughter states the patient needs refills, however, the pharmacy will not distribute the medication to the patient due to her being overdue for an appointment. The patient is currently scheduled for 12/14/20 with Dr. Lovena Le. Are we able to get her a supply of medication to at least hold her over until this appointment? Please advise.

## 2020-11-22 NOTE — Telephone Encounter (Signed)
Refill has been sent to mail pharmacy and local pharmacy.

## 2020-12-13 ENCOUNTER — Telehealth (INDEPENDENT_AMBULATORY_CARE_PROVIDER_SITE_OTHER): Payer: PPO | Admitting: Family

## 2020-12-13 ENCOUNTER — Other Ambulatory Visit: Payer: Self-pay

## 2020-12-13 DIAGNOSIS — J209 Acute bronchitis, unspecified: Secondary | ICD-10-CM

## 2020-12-13 MED ORDER — BENZONATATE 100 MG PO CAPS
100.0000 mg | ORAL_CAPSULE | Freq: Three times a day (TID) | ORAL | 0 refills | Status: DC | PRN
Start: 2020-12-13 — End: 2020-12-30

## 2020-12-13 MED ORDER — CEFDINIR 300 MG PO CAPS
300.0000 mg | ORAL_CAPSULE | Freq: Two times a day (BID) | ORAL | 0 refills | Status: DC
Start: 2020-12-13 — End: 2020-12-30

## 2020-12-13 NOTE — Progress Notes (Signed)
[name] is a 87 y.o. female with the following history as recorded in EpicCare:  Patient Active Problem List   Diagnosis Date Noted  . Bee allergy status 04/18/2019  . Routine health maintenance 12/01/2011  . Stroke ([place]) 03/09/2011  . Atrial fibrillation ([place]) 03/09/2011  . Hyperlipidemia 10/09/2008  . Essential hypertension 09/24/2007    Current Outpatient Medications  Medication Sig Dispense Refill  . benzonatate (TESSALON) 100 MG capsule Take 1 capsule (100 mg total) by mouth 3 (three) times daily as needed. 30 capsule 0  . cefdinir (OMNICEF) 300 MG capsule Take 1 capsule (300 mg total) by mouth 2 (two) times daily. 20 capsule 0  . acetaminophen (TYLENOL) 325 MG tablet Take 650 mg by mouth every 6 (six) hours as needed.    Marland Kitchen amiodarone (PACERONE) 200 MG tablet Take 1 tablet by mouth on Monday,Tuesday,Wednesday,Thursday and Friday.Do not take on Saturday or Sunday.Please keep upcoming appt. Thanks 90 tablet 0  . amLODipine (NORVASC) 10 MG tablet TAKE 1 TABLET(10 MG) BY MOUTH DAILY 90 tablet 1  . calcium-vitamin D (OSCAL WITH D) 500-200 MG-UNIT tablet Take 1 tablet by mouth every morning.    Marland Kitchen EPINEPHrine 0.3 mg/0.3 mL IJ SOAJ injection Inject 0.3 mLs (0.3 mg total) into the muscle as needed for anaphylaxis. 2 Device 0  . fluorouracil (EFUDEX) 5 % cream Apply 1 application topically 2 (two) times daily.    Marland Kitchen losartan (COZAAR) 100 MG tablet TAKE 1 TABLET (100 MG TOTAL) BY MOUTH DAILY. 90 tablet 1  . Multiple Vitamin (MULTIVITAMIN WITH MINERALS) TABS tablet Take 1 tablet by mouth 2 (two) times daily.    . nitroGLYCERIN (NITROSTAT) 0.4 MG SL tablet Place 1 tablet (0.4 mg total) under the tongue every 5 (five) minutes as needed for chest pain (MAX 3 TABLETS). 75 tablet 1  . polyethylene glycol (MIRALAX / GLYCOLAX) packet Take 17 g by mouth daily as needed (constipation).     . Rivaroxaban (XARELTO) 15 MG TABS tablet Take 1 tablet (15 mg total) by mouth daily. 90 tablet 1   No  current facility-administered medications for this visit.    Allergies: Codeine  Past Medical History:  Diagnosis Date  . Atrial fibrillation ([place])   . Cervical arthritis   . COLONIC POLYPS, HX OF 09/24/2007  . H/O: hysterectomy    fibroid tumors  . Hip arthritis   . History of colonic polyps   . History of stroke   . HTN (hypertension)   . Hyperlipidemia   . Stroke ([place]) 02/2010  . SUPRAVENTRICULAR ARRHYTHMIA 12/20/2010   s/p RFCA 05/2010-Dr. Lovena Le    Past Surgical History:  Procedure Laterality Date  . ABDOMINAL HYSTERECTOMY    . BREAST LUMPECTOMY     left  . FB removal from popliteal fossae    . HIP ARTHROPLASTY Right 08/31/2018   Procedure: ARTHROPLASTY BIPOLAR HIP (HEMIARTHROPLASTY);  Surgeon: Renette Butters, MD;  Location: [facility];  Service: Orthopedics;  Laterality: Right;  . INTRACAPSULAR CATARACT EXTRACTION      Left eye Oct 18, 2009  right eye Jan '11 By Dr Era Skeen  . PILONIDAL CYST EXCISION    . TRANSTHORACIC ECHOCARDIOGRAM  2007    Family History  Problem Relation Age of Onset  . Heart attack Mother   . Stroke Mother   . Coronary artery disease Mother   . Heart disease Mother   . Cancer Father        Colon Cancer  . Diabetes Daughter   .  Cancer Daughter        Leukemia    Social History   Tobacco Use  . Smoking status: Never Smoker  . Smokeless tobacco: Never Used  Substance Use Topics  . Alcohol use: No    Alcohol/week: 0.0 standard drinks    Comment: rare    Subjective:   I connected with [name] on 12/13/20 at 11:20 AM EST by a video enabled telemedicine application and verified that I am speaking with the correct person using two identifiers.   I discussed the limitations of evaluation and management by telemedicine and the availability of in person appointments. The patient expressed understanding and agreed to proceed. Provider in office/ patient is at home; provider and patient's daughter and patient are only 2 people on video  call.   10-12 day history of "deep cough." Per patient, she is actually feeling some better in the past 2 days; using OTC Mucinex; denies any chest pain or shortness of breath or difficulty breathing; lives with her son who had similar symptoms- his COVID test was negative; Patient is fully vaccinated and boosted; notes that she has been sleeping upright in her recliner at night and able to sleep well;     Objective:  There were no vitals filed for this visit.  General: Well developed, well nourished, in no acute distress  Skin : Warm and dry.  Head: Normocephalic and atraumatic  Lungs: Respirations unlabored;  Neurologic: Alert and oriented; speech intact; face symmetrical;   Assessment:  1. Acute bronchitis, unspecified organism     Plan:  Will treat with same regimen that was successful in 2020; patient and daughter are comfortable with this plan; Rx for Omnicef and Tessalon Perles; increase fluids, rest and follow-up worse, no better; to consider CXR if symptoms do not continue to improve; Plan for yearly visit with her PCP in about 2 weeks in office;     No follow-ups on file.  No orders of the defined types were placed in this encounter.   Requested Prescriptions   Signed Prescriptions Disp Refills  . benzonatate (TESSALON) 100 MG capsule 30 capsule 0    Sig: Take 1 capsule (100 mg total) by mouth 3 (three) times daily as needed.  . cefdinir (OMNICEF) 300 MG capsule 20 capsule 0    Sig: Take 1 capsule (300 mg total) by mouth 2 (two) times daily.

## 2020-12-14 ENCOUNTER — Encounter: Payer: Self-pay | Admitting: Internal Medicine

## 2020-12-14 ENCOUNTER — Ambulatory Visit: Payer: PPO | Admitting: Internal Medicine

## 2020-12-14 ENCOUNTER — Other Ambulatory Visit: Payer: Self-pay

## 2020-12-14 VITALS — BP 136/68 | HR 78 | Ht 61.0 in | Wt 114.8 lb

## 2020-12-14 DIAGNOSIS — I639 Cerebral infarction, unspecified: Secondary | ICD-10-CM

## 2020-12-14 DIAGNOSIS — I10 Essential (primary) hypertension: Secondary | ICD-10-CM | POA: Diagnosis not present

## 2020-12-14 DIAGNOSIS — I4821 Permanent atrial fibrillation: Secondary | ICD-10-CM

## 2020-12-14 NOTE — Patient Instructions (Signed)

## 2020-12-14 NOTE — Progress Notes (Signed)
HPI Mrs. Darrah returns todayfor ongoing evaluation and management of paroxysmal atrial fibrillation on amiodarone, remote stroke, remote SVT status post remote ablation. In addition, she has hypertension. No chest pain or shortness of breath. She has had one episode of recurrent atrial fibrillation which resulted in a visit to the emergency room. She is returned back to sinus rhythm. She denies chest pain or shortness of breath.Nosyncope. She has had a good year with no complaints. Allergies  Allergen Reactions  . Codeine Rash    fine rash     Current Outpatient Medications  Medication Sig Dispense Refill  . acetaminophen (TYLENOL) 325 MG tablet Take 650 mg by mouth every 6 (six) hours as needed.    Marland Kitchen amiodarone (PACERONE) 200 MG tablet Take 1 tablet by mouth on Monday,Tuesday,Wednesday,Thursday and Friday.Do not take on Saturday or Sunday.Please keep upcoming appt. Thanks 90 tablet 0  . amLODipine (NORVASC) 10 MG tablet TAKE 1 TABLET(10 MG) BY MOUTH DAILY 90 tablet 1  . benzonatate (TESSALON) 100 MG capsule Take 1 capsule (100 mg total) by mouth 3 (three) times daily as needed. 30 capsule 0  . calcium-vitamin D (OSCAL WITH D) 500-200 MG-UNIT tablet Take 1 tablet by mouth every morning.    . cefdinir (OMNICEF) 300 MG capsule Take 1 capsule (300 mg total) by mouth 2 (two) times daily. 20 capsule 0  . EPINEPHrine 0.3 mg/0.3 mL IJ SOAJ injection Inject 0.3 mLs (0.3 mg total) into the muscle as needed for anaphylaxis. 2 Device 0  . fluorouracil (EFUDEX) 5 % cream Apply 1 application topically 2 (two) times daily.    Marland Kitchen losartan (COZAAR) 100 MG tablet TAKE 1 TABLET (100 MG TOTAL) BY MOUTH DAILY. 90 tablet 1  . Multiple Vitamin (MULTIVITAMIN WITH MINERALS) TABS tablet Take 1 tablet by mouth 2 (two) times daily.    . nitroGLYCERIN (NITROSTAT) 0.4 MG SL tablet Place 1 tablet (0.4 mg total) under the tongue every 5 (five) minutes as needed for chest pain (MAX 3 TABLETS). 75 tablet 1  .  polyethylene glycol (MIRALAX / GLYCOLAX) packet Take 17 g by mouth daily as needed (constipation).    . Rivaroxaban (XARELTO) 15 MG TABS tablet Take 1 tablet (15 mg total) by mouth daily. 90 tablet 1   No current facility-administered medications for this visit.     Past Medical History:  Diagnosis Date  . Atrial fibrillation ([place])   . Cervical arthritis   . COLONIC POLYPS, HX OF 09/24/2007  . H/O: hysterectomy    fibroid tumors  . Hip arthritis   . History of colonic polyps   . History of stroke   . HTN (hypertension)   . Hyperlipidemia   . Stroke ([place]) 02/2010  . SUPRAVENTRICULAR ARRHYTHMIA 12/20/2010   s/p RFCA 05/2010-Dr. Lovena Le    ROS:   All systems reviewed and negative except as noted in the HPI.   Past Surgical History:  Procedure Laterality Date  . ABDOMINAL HYSTERECTOMY    . BREAST LUMPECTOMY     left  . FB removal from popliteal fossae    . HIP ARTHROPLASTY Right 08/31/2018   Procedure: ARTHROPLASTY BIPOLAR HIP (HEMIARTHROPLASTY);  Surgeon: Renette Butters, MD;  Location: [facility];  Service: Orthopedics;  Laterality: Right;  . INTRACAPSULAR CATARACT EXTRACTION      Left eye Oct 18, 2009  right eye Jan '11 By Dr Era Skeen  . PILONIDAL CYST EXCISION    . TRANSTHORACIC ECHOCARDIOGRAM  2007     Family  History  Problem Relation Age of Onset  . Heart attack Mother   . Stroke Mother   . Coronary artery disease Mother   . Heart disease Mother   . Cancer Father        Colon Cancer  . Diabetes Daughter   . Cancer Daughter        Leukemia     Social History   Socioeconomic History  . Marital status: Widowed    Spouse name: Not on file  . Number of children: 6  . Years of education: Not on file  . Highest education level: Not on file  Occupational History  . Occupation: Retired    Fish farm manager: RETIRED  Tobacco Use  . Smoking status: Never Smoker  . Smokeless tobacco: Never Used  Vaping Use  . Vaping Use: Never used  Substance and Sexual Activity   . Alcohol use: No    Alcohol/week: 0.0 standard drinks    Comment: rare  . Drug use: No  . Sexual activity: Never  Other Topics Concern  . Not on file  Social History Narrative   married - 1951- widowed March 21, 2023. 4-daughters, 1 died leukemia; 2 sons; 10 grandchildren; 5 great-grandchildren. I-ADLs. Retired. Lives in her own home but one of her children is with her all of the time. ACP- No CPR, no prolonged mechanical ventilation; not to be sustained long term with artificial feeding or hydration; no chronic HD; no other heroic or futile.          Social Determinants of Health   Financial Resource Strain: Not on file  Food Insecurity: Not on file  Transportation Needs: Not on file  Physical Activity: Not on file  Stress: Not on file  Social Connections: Not on file  Intimate Partner Violence: Not on file     BP 136/68   Pulse 78   Ht 5\' 1"  (1.549 m)   Wt 114 lb 12.8 oz (52.1 kg)   SpO2 98%   BMI 21.69 kg/m   Physical Exam:  Well appearing NAD HEENT: Unremarkable Neck:  No JVD, no thyromegally Lymphatics:  No adenopathy Back:  No CVA tenderness Lungs:  Clear HEART:  Regular rate rhythm, no murmurs, no rubs, no clicks Abd:  soft, positive bowel sounds, no organomegally, no rebound, no guarding Ext:  2 plus pulses, no edema, no cyanosis, no clubbing Skin:  No rashes no nodules Neuro:  CN II through XII intact, motor grossly intact  EKG - nsr  DEVICE  Normal device function.  See PaceArt for details.   Assess/Plan: 1. PAF - she is maintaining NSR on low dose amiodarone which she will continue. 2. HTN - her bp is better at home. She will continue her current meds. 3. Coags - I asked her to start back taking xarelto 15 mg  Daily. 4. Weight loss - her weight is up 7 lbs from before.   Carleene Overlie ,MD

## 2020-12-20 ENCOUNTER — Other Ambulatory Visit: Payer: Self-pay | Admitting: *Deleted

## 2020-12-20 MED ORDER — AMLODIPINE BESYLATE 10 MG PO TABS: 10.0000 mg | ORAL_TABLET | Freq: Every day | ORAL | 0 refills | Status: DC

## 2020-12-28 DIAGNOSIS — H353122 Nonexudative age-related macular degeneration, left eye, intermediate dry stage: Secondary | ICD-10-CM | POA: Diagnosis not present

## 2020-12-28 DIAGNOSIS — H353211 Exudative age-related macular degeneration, right eye, with active choroidal neovascularization: Secondary | ICD-10-CM | POA: Diagnosis not present

## 2020-12-30 ENCOUNTER — Encounter: Payer: Self-pay | Admitting: Internal Medicine

## 2020-12-30 ENCOUNTER — Ambulatory Visit (INDEPENDENT_AMBULATORY_CARE_PROVIDER_SITE_OTHER): Payer: PPO | Admitting: Internal Medicine

## 2020-12-30 ENCOUNTER — Other Ambulatory Visit: Payer: Self-pay

## 2020-12-30 VITALS — BP 128/62 | HR 70 | Temp 97.7°F | Resp 18 | Ht 61.0 in | Wt 109.6 lb

## 2020-12-30 DIAGNOSIS — I4821 Permanent atrial fibrillation: Secondary | ICD-10-CM | POA: Diagnosis not present

## 2020-12-30 DIAGNOSIS — I10 Essential (primary) hypertension: Secondary | ICD-10-CM

## 2020-12-30 DIAGNOSIS — Z8673 Personal history of transient ischemic attack (TIA), and cerebral infarction without residual deficits: Secondary | ICD-10-CM | POA: Diagnosis not present

## 2020-12-30 DIAGNOSIS — Z0000 Encounter for general adult medical examination without abnormal findings: Secondary | ICD-10-CM

## 2020-12-30 LAB — LIPID PANEL
Cholesterol: 231 mg/dL — ABNORMAL HIGH (ref 0–200)
HDL: 102.7 mg/dL (ref >39.00)
LDL Cholesterol: 116 mg/dL — ABNORMAL HIGH (ref 0–99)
NonHDL: 128.03
Total CHOL/HDL Ratio: 2
Triglycerides: 59 mg/dL (ref 0.0–149.0)
VLDL: 11.8 mg/dL (ref 0.0–40.0)

## 2020-12-30 LAB — COMPREHENSIVE METABOLIC PANEL
ALT: 15 U/L (ref 0–35)
AST: 21 U/L (ref 0–37)
Albumin: 4.5 g/dL (ref 3.5–5.2)
Alkaline Phosphatase: 57 U/L (ref 39–117)
BUN: 22 mg/dL (ref 6–23)
CO2: 25 mEq/L (ref 19–32)
Calcium: 10.2 mg/dL (ref 8.4–10.5)
Chloride: 104 mEq/L (ref 96–112)
Creatinine, Ser: 1.35 mg/dL — ABNORMAL HIGH (ref 0.40–1.20)
GFR: 34.56 mL/min — ABNORMAL LOW (ref >60.00)
Glucose, Bld: 120 mg/dL — ABNORMAL HIGH (ref 70–99)
Potassium: 3.9 mEq/L (ref 3.5–5.1)
Sodium: 137 mEq/L (ref 135–145)
Total Bilirubin: 0.5 mg/dL (ref 0.2–1.2)
Total Protein: 7.7 g/dL (ref 6.0–8.3)

## 2020-12-30 LAB — CBC
HCT: 40.1 % (ref 36.0–46.0)
Hemoglobin: 13.6 g/dL (ref 12.0–15.0)
MCHC: 33.9 g/dL (ref 30.0–36.0)
MCV: 85.4 fl (ref 78.0–100.0)
Platelets: 313 10*3/uL (ref 150.0–400.0)
RBC: 4.7 Mil/uL (ref 3.87–5.11)
RDW: 16.1 % — ABNORMAL HIGH (ref 11.5–15.5)
WBC: 6.3 10*3/uL (ref 4.0–10.5)

## 2020-12-30 NOTE — Assessment & Plan Note (Signed)
Checking CMP and adjust amlodipine and losartan as needed. BP at goal.

## 2020-12-30 NOTE — Assessment & Plan Note (Signed)
No recurrence. Checking lipid panel and adjust as needed. Is not taking statin.

## 2020-12-30 NOTE — Patient Instructions (Signed)
Health Maintenance, Female Adopting a healthy lifestyle and getting preventive care are important in promoting health and wellness. Ask your health care provider about:  The right schedule for you to have regular tests and exams.  Things you can do on your own to prevent diseases and keep yourself healthy. What should I know about diet, weight, and exercise? Eat a healthy diet  Eat a diet that includes plenty of vegetables, fruits, low-fat dairy products, and lean protein.  Do not eat a lot of foods that are high in solid fats, added sugars, or sodium.   Maintain a healthy weight Body mass index (BMI) is used to identify weight problems. It estimates body fat based on height and weight. Your health care provider can help determine your BMI and help you achieve or maintain a healthy weight. Get regular exercise Get regular exercise. This is one of the most important things you can do for your health. Most adults should:  Exercise for at least 150 minutes each week. The exercise should increase your heart rate and make you sweat (moderate-intensity exercise).  Do strengthening exercises at least twice a week. This is in addition to the moderate-intensity exercise.  Spend less time sitting. Even light physical activity can be beneficial. Watch cholesterol and blood lipids Have your blood tested for lipids and cholesterol at 85 years of age, then have this test every 5 years. Have your cholesterol levels checked more often if:  Your lipid or cholesterol levels are high.  You are older than 85 years of age.  You are at high risk for heart disease. What should I know about cancer screening? Depending on your health history and family history, you may need to have cancer screening at various ages. This may include screening for:  Breast cancer.  Cervical cancer.  Colorectal cancer.  Skin cancer.  Lung cancer. What should I know about heart disease, diabetes, and high blood  pressure? Blood pressure and heart disease  High blood pressure causes heart disease and increases the risk of stroke. This is more likely to develop in people who have high blood pressure readings, are of African descent, or are overweight.  Have your blood pressure checked: ? Every 3-5 years if you are 18-39 years of age. ? Every year if you are 40 years old or older. Diabetes Have regular diabetes screenings. This checks your fasting blood sugar level. Have the screening done:  Once every three years after age 40 if you are at a normal weight and have a low risk for diabetes.  More often and at a younger age if you are overweight or have a high risk for diabetes. What should I know about preventing infection? Hepatitis B If you have a higher risk for hepatitis B, you should be screened for this virus. Talk with your health care provider to find out if you are at risk for hepatitis B infection. Hepatitis C Testing is recommended for:  Everyone born from 1945 through 1965.  Anyone with known risk factors for hepatitis C. Sexually transmitted infections (STIs)  Get screened for STIs, including gonorrhea and chlamydia, if: ? You are sexually active and are younger than 85 years of age. ? You are older than 85 years of age and your health care provider tells you that you are at risk for this type of infection. ? Your sexual activity has changed since you were last screened, and you are at increased risk for chlamydia or gonorrhea. Ask your health care provider   if you are at risk.  Ask your health care provider about whether you are at high risk for HIV. Your health care provider may recommend a prescription medicine to help prevent HIV infection. If you choose to take medicine to prevent HIV, you should first get tested for HIV. You should then be tested every 3 months for as long as you are taking the medicine. Pregnancy  If you are about to stop having your period (premenopausal) and  you may become pregnant, seek counseling before you get pregnant.  Take 400 to 800 micrograms (mcg) of folic acid every day if you become pregnant.  Ask for birth control (contraception) if you want to prevent pregnancy. Osteoporosis and menopause Osteoporosis is a disease in which the bones lose minerals and strength with aging. This can result in bone fractures. If you are 65 years old or older, or if you are at risk for osteoporosis and fractures, ask your health care provider if you should:  Be screened for bone loss.  Take a calcium or vitamin D supplement to lower your risk of fractures.  Be given hormone replacement therapy (HRT) to treat symptoms of menopause. Follow these instructions at home: Lifestyle  Do not use any products that contain nicotine or tobacco, such as cigarettes, e-cigarettes, and chewing tobacco. If you need help quitting, ask your health care provider.  Do not use street drugs.  Do not share needles.  Ask your health care provider for help if you need support or information about quitting drugs. Alcohol use  Do not drink alcohol if: ? Your health care provider tells you not to drink. ? You are pregnant, may be pregnant, or are planning to become pregnant.  If you drink alcohol: ? Limit how much you use to 0-1 drink a day. ? Limit intake if you are breastfeeding.  Be aware of how much alcohol is in your drink. In the [place] one drink equals one 12 oz bottle of beer (355 mL), one 5 oz glass of wine (148 mL), or one 1 oz glass of hard liquor (44 mL). General instructions  Schedule regular health, dental, and eye exams.  Stay current with your vaccines.  Tell your health care provider if: ? You often feel depressed. ? You have ever been abused or do not feel safe at home. Summary  Adopting a healthy lifestyle and getting preventive care are important in promoting health and wellness.  Follow your health care provider's instructions about healthy  diet, exercising, and getting tested or screened for diseases.  Follow your health care provider's instructions on monitoring your cholesterol and blood pressure. This information is not intended to replace advice given to you by your health care provider. Make sure you discuss any questions you have with your health care provider. Document Revised: 11/13/2018 Document Reviewed: 11/13/2018 Elsevier Patient Education  2021 Elsevier Inc.  

## 2020-12-30 NOTE — Assessment & Plan Note (Signed)
Flu shot up to date. Covid-19 up to date including booster. Pneumonia complete. Shingrix counseled. Tetanus due 2026. Colonoscopy aged out. Mammogram aged out, pap smear aged out and dexa declines further. Counseled about sun safety and mole surveillance. Counseled about the dangers of distracted driving. Given 10 year screening recommendations.

## 2020-12-30 NOTE — Progress Notes (Signed)
Subjective:   Patient ID: [name], female    DOB: 07/21/1928, 87 y.o.   MRN: [id number]  HPI Here for medicare wellness and physical, no new complaints. Please see A/P for status and treatment of chronic medical problems.   Diet: heart healthy Physical activity: sedentary Depression/mood screen: negative Hearing: intact to whispered voice Visual acuity: grossly normal with lens, performs annual eye exam  ADLs: capable Fall risk: low, 1 fall in past year Home safety: good Cognitive evaluation: intact to orientation, naming, recall and repetition EOL planning: adv directives discussed  [place] Visit from 12/30/2020 in [place] at [facility] Total Score 0        I have personally reviewed and have noted 1. The patient's medical and social history - reviewed today no changes 2. Their use of alcohol, tobacco or illicit drugs 3. Their current medications and supplements 4. The patient's functional ability including ADL's, fall risks, home safety risks and hearing or visual impairment. 5. Diet and physical activities 6. Evidence for depression or mood disorders 7. Care team reviewed and updated  Patient Care Team: Hoyt Koch, MD as PCP - General (Internal Medicine) Past Medical History:  Diagnosis Date  . Atrial fibrillation ([place])   . Cervical arthritis   . COLONIC POLYPS, HX OF 09/24/2007  . H/O: hysterectomy    fibroid tumors  . Hip arthritis   . History of colonic polyps   . History of stroke   . HTN (hypertension)   . Hyperlipidemia   . Stroke ([place]) 02/2010  . SUPRAVENTRICULAR ARRHYTHMIA 12/20/2010   s/p RFCA 05/2010-Dr. Lovena Le   Past Surgical History:  Procedure Laterality Date  . ABDOMINAL HYSTERECTOMY    . BREAST LUMPECTOMY     left  . FB removal from popliteal fossae    . HIP ARTHROPLASTY Right 08/31/2018   Procedure: ARTHROPLASTY BIPOLAR HIP (HEMIARTHROPLASTY);  Surgeon: Renette Butters, MD;  Location: [facility];  Service: Orthopedics;  Laterality: Right;  . INTRACAPSULAR CATARACT EXTRACTION      Left eye Oct 18, 2009  right eye Jan '11 By Dr Era Skeen  . PILONIDAL CYST EXCISION    . TRANSTHORACIC ECHOCARDIOGRAM  2007   Family History  Problem Relation Age of Onset  . Heart attack Mother   . Stroke Mother   . Coronary artery disease Mother   . Heart disease Mother   . Cancer Father        Colon Cancer  . Diabetes Daughter   . Cancer Daughter        Leukemia   Review of Systems  Constitutional: Negative.   HENT: Negative.   Eyes: Negative.   Respiratory: Negative for cough, chest tightness and shortness of breath.   Cardiovascular: Negative for chest pain, palpitations and leg swelling.  Gastrointestinal: Negative for abdominal distention, abdominal pain, constipation, diarrhea, nausea and vomiting.  Musculoskeletal: Negative.   Skin: Negative.   Neurological: Negative.   Psychiatric/Behavioral: Negative.     Objective:  Physical Exam Constitutional:      Appearance: She is well-developed and well-nourished.  HENT:     Head: Normocephalic and atraumatic.  Eyes:     Extraocular Movements: EOM normal.  Cardiovascular:     Rate and Rhythm: Normal rate. Rhythm irregular.  Pulmonary:     Effort: Pulmonary effort is normal. No respiratory distress.     Breath sounds: Normal breath sounds. No wheezing or rales.  Abdominal:     General: Bowel  sounds are normal. There is no distension.     Palpations: Abdomen is soft.     Tenderness: There is no abdominal tenderness. There is no rebound.  Musculoskeletal:        General: No edema.     Cervical back: Normal range of motion.  Skin:    General: Skin is warm and dry.  Neurological:     Mental Status: She is alert and oriented to person, place, and time.     Coordination: Coordination normal.  Psychiatric:        Mood and Affect: Mood and affect normal.     Vitals:   12/30/20 1111  BP: 128/62  Pulse: 70  Resp: 18   Temp: 97.7 F (36.5 C)  TempSrc: Oral  SpO2: 98%  Weight: 109 lb 9.6 oz (49.7 kg)  Height: 5\' 1"  (1.549 m)    This visit occurred during the SARS-CoV-2 public health emergency.  Safety protocols were in place, including screening questions prior to the visit, additional usage of staff PPE, and extensive cleaning of exam room while observing appropriate contact time as indicated for disinfecting solutions.   Assessment & Plan:

## 2020-12-30 NOTE — Assessment & Plan Note (Signed)
Stable, on amlodipine and amiodarone and xarelto. Checking CBC, CMP. Adjust as needed.

## 2021-01-26 ENCOUNTER — Other Ambulatory Visit: Payer: Self-pay

## 2021-01-26 MED ORDER — RIVAROXABAN 15 MG PO TABS: 15.0000 mg | ORAL_TABLET | Freq: Every day | ORAL | 1 refills | Status: DC

## 2021-01-26 MED ORDER — LOSARTAN POTASSIUM 100 MG PO TABS: ORAL_TABLET | ORAL | 1 refills | Status: DC

## 2021-02-04 ENCOUNTER — Other Ambulatory Visit: Payer: Self-pay | Admitting: Internal Medicine

## 2021-02-22 DIAGNOSIS — H353122 Nonexudative age-related macular degeneration, left eye, intermediate dry stage: Secondary | ICD-10-CM | POA: Diagnosis not present

## 2021-02-22 DIAGNOSIS — H353211 Exudative age-related macular degeneration, right eye, with active choroidal neovascularization: Secondary | ICD-10-CM | POA: Diagnosis not present

## 2021-04-19 DIAGNOSIS — H35033 Hypertensive retinopathy, bilateral: Secondary | ICD-10-CM | POA: Diagnosis not present

## 2021-04-19 DIAGNOSIS — H353122 Nonexudative age-related macular degeneration, left eye, intermediate dry stage: Secondary | ICD-10-CM | POA: Diagnosis not present

## 2021-04-19 DIAGNOSIS — H353211 Exudative age-related macular degeneration, right eye, with active choroidal neovascularization: Secondary | ICD-10-CM | POA: Diagnosis not present

## 2021-04-19 DIAGNOSIS — H35372 Puckering of macula, left eye: Secondary | ICD-10-CM | POA: Diagnosis not present

## 2021-04-25 ENCOUNTER — Telehealth: Payer: Self-pay | Admitting: Internal Medicine

## 2021-04-25 NOTE — Progress Notes (Signed)
  Chronic Care Management   Note  04/25/2021 Name: [name] MRN: [id number] DOB: 01/31/1928  [name] is a 87 y.o. year old female who is a primary care patient of Hoyt Koch, MD. I reached out to [name] by phone today in response to a referral sent by [name]'s PCP, Hoyt Koch, MD.   [name] was given information about Chronic Care Management services today including:  1. CCM service includes personalized support from designated clinical staff supervised by her physician, including individualized plan of care and coordination with other care providers 2. 24/7 contact phone numbers for assistance for urgent and routine care needs. 3. Service will only be billed when office clinical staff spend 20 minutes or more in a month to coordinate care. 4. Only one practitioner may furnish and bill the service in a calendar month. 5. The patient may stop CCM services at any time (effective at the end of the month) by phone call to the office staff.   Patient agreed to services and verbal consent obtained.   Follow up plan:   [place]

## 2021-05-10 ENCOUNTER — Telehealth: Payer: Self-pay | Admitting: Internal Medicine

## 2021-05-10 MED ORDER — AMLODIPINE BESYLATE 10 MG PO TABS: 10.0000 mg | ORAL_TABLET | Freq: Every day | ORAL | 1 refills | Status: DC

## 2021-05-10 NOTE — Telephone Encounter (Signed)
Medication has been sent to the patient's pharmacy.  

## 2021-05-10 NOTE — Telephone Encounter (Signed)
  1.Medication Requested: amLODipine (NORVASC) 10 MG tablet  2. Pharmacy (Name, [facility], [facility] ([place]) - [place], [place]  3. On Med List: yes  4. Last Visit with PCP:  5. Next visit date with PCP: n/a   Agent: Please be advised that RX refills may take up to 3 business days. We ask that you follow-up with your pharmacy.

## 2021-05-26 ENCOUNTER — Other Ambulatory Visit: Payer: Self-pay

## 2021-05-26 ENCOUNTER — Ambulatory Visit (INDEPENDENT_AMBULATORY_CARE_PROVIDER_SITE_OTHER): Payer: PPO

## 2021-05-26 DIAGNOSIS — I4821 Permanent atrial fibrillation: Secondary | ICD-10-CM | POA: Diagnosis not present

## 2021-05-26 DIAGNOSIS — I10 Essential (primary) hypertension: Secondary | ICD-10-CM

## 2021-05-26 DIAGNOSIS — E782 Mixed hyperlipidemia: Secondary | ICD-10-CM | POA: Diagnosis not present

## 2021-05-26 DIAGNOSIS — Z8673 Personal history of transient ischemic attack (TIA), and cerebral infarction without residual deficits: Secondary | ICD-10-CM

## 2021-05-26 NOTE — Progress Notes (Cosign Needed)
Chronic Care Management Pharmacy Note  05/26/2021 Name:  [name] MRN:  [id number] DOB:  [birth year]  Summary: - Patient reports no issues from current medications  - Discussed dietary recommendations to aid in control of blood pressure and cholesterol levels  - Patient notes that she was bitten by a tic earlier this week, no issues, denies rash, has been applying neosporin ointment to bite and is doing well - Uses epinephrine, benadryl, and famotidine in case of bee stings due to allergy that can cause anaphylaxis, unsure of location of epi-pen at this time    Recommendations/Changes made from today's visit: - Advised for reduction of sodium/ fried and fatty foods/ red meats in diet to improve blood pressure and cholesterol - will reach out to PCP for refill of epi-pen so patient has in-date medication in case of emergency   Subjective: [name] is an 87 y.o. year old female who is a primary patient of Hoyt Koch, MD.  The CCM team was consulted for assistance with disease management and care coordination needs.    Engaged with patient face to face for initial visit in response to provider referral for pharmacy case management and/or care coordination services.   Consent to Services:  The patient was given the following information about Chronic Care Management services today, agreed to services, and gave verbal consent: 1. CCM service includes personalized support from designated clinical staff supervised by the primary care provider, including individualized plan of care and coordination with other care providers 2. 24/7 contact phone numbers for assistance for urgent and routine care needs. 3. Service will only be billed when office clinical staff spend 20 minutes or more in a month to coordinate care. 4. Only one practitioner may furnish and bill the service in a calendar month. 5.The patient may stop CCM services at any time (effective at the end of the month)  by phone call to the office staff. 6. The patient will be responsible for cost sharing (co-pay) of up to 20% of the service fee (after annual deductible is met). Patient agreed to services and consent obtained.  Patient Care Team: Hoyt Koch, MD as PCP - General (Internal Medicine) Delice Bison Darnelle Maffucci, [facility] as Pharmacist (Pharmacist)  Recent office visits: 12/30/2020 - PCP visit - medicare wellness and physical - no changes to medications  12/13/2020 - Jodi Mourning FNP - televideo visit - 10-12 day history of deep cough - COVID negative   Recent consult visits: 02/22/2021 - Dr. Jule Ser - ophthalmology - macular degeneration  12/28/2020 - Dr. Jule Ser - ophthalmology - macular degeneration  12/14/2020 - Dr. Lovena Le - cardiology - restarting  [facility] visits: None in previous 6 months  Objective:  Lab Results  Component Value Date   CREATININE 1.35 (H) 12/30/2020   BUN 22 12/30/2020   GFR 34.56 (L) 12/30/2020   GFRNONAA 44 (L) 08/31/2018   GFRAA 50 (L) 08/31/2018   NA 137 12/30/2020   K 3.9 12/30/2020   CALCIUM 10.2 12/30/2020   CO2 25 12/30/2020   GLUCOSE 120 (H) 12/30/2020    Lab Results  Component Value Date/Time   GFR 34.56 (L) 12/30/2020 11:43 AM   GFR 41.44 (L) 12/15/2019 11:27 AM    Last diabetic Eye exam:  No results found for: HMDIABEYEEXA  Last diabetic Foot exam:  No results found for: HMDIABFOOTEX   Lab Results  Component Value Date   CHOL 231 (H) 12/30/2020   HDL 102.70 12/30/2020  LDLCALC 116 (H) 12/30/2020   LDLDIRECT 60.2 11/30/2011   TRIG 59.0 12/30/2020   CHOLHDL 2 12/30/2020    Hepatic Function Latest Ref Rng & Units 12/30/2020 12/15/2019 04/18/2019  Total Protein 6.0 - 8.3 g/dL 7.7 7.8 7.6  Albumin 3.5 - 5.2 g/dL 4.5 4.5 4.4  AST 0 - 37 U/L '21 20 19  ' ALT 0 - 35 U/L '15 14 15  ' Alk Phosphatase 39 - 117 U/L 57 61 68  Total Bilirubin 0.2 - 1.2 mg/dL 0.5 0.4 0.4  Bilirubin, Direct 0.0 - 0.3 mg/dL - - -    Lab Results  Component Value  Date/Time   TSH 1.66 01/01/2018 03:06 PM   TSH 1.68 12/25/2016 02:52 PM   FREET4 1.41 12/25/2016 02:52 PM   FREET4 1.39 12/23/2015 02:43 PM    CBC Latest Ref Rng & Units 12/30/2020 12/15/2019 04/18/2019  WBC 4.0 - 10.5 K/uL 6.3 8.1 8.8  Hemoglobin 12.0 - 15.0 g/dL 13.6 13.5 13.9  Hematocrit 36.0 - 46.0 % 40.1 40.2 41.2  Platelets 150.0 - 400.0 K/uL 313.0 316.0 345.0    Lab Results  Component Value Date/Time   VD25OH 19 (L) 04/06/2011 03:55 PM    Clinical ASCVD: Yes  The ASCVD Risk score (Goff DC Jr., et al., 2013) failed to calculate for the following reasons:   The 2013 ASCVD risk score is only valid for ages 25 to 96   The patient has a prior MI or stroke diagnosis    Depression screen [facility] 2/9 12/30/2020 01/10/2019 02/14/2016  Decreased Interest 0 0 0  Down, Depressed, Hopeless 0 0 0  PHQ - 2 Score 0 0 0    Social History   Tobacco Use  Smoking Status Never  Smokeless Tobacco Never   BP Readings from Last 3 Encounters:  12/30/20 128/62  12/14/20 136/68  12/15/19 (!) 142/84   Pulse Readings from Last 3 Encounters:  12/30/20 70  12/14/20 78  12/15/19 79   Wt Readings from Last 3 Encounters:  12/30/20 109 lb 9.6 oz (49.7 kg)  12/14/20 114 lb 12.8 oz (52.1 kg)  12/15/19 115 lb (52.2 kg)   BMI Readings from Last 3 Encounters:  12/30/20 20.71 kg/m  12/14/20 21.69 kg/m  12/15/19 21.73 kg/m    Assessment/Interventions: Review of patient past medical history, allergies, medications, health status, including review of consultants reports, laboratory and other test data, was performed as part of comprehensive evaluation and provision of chronic care management services.   SDOH:  (Social Determinants of Health) assessments and interventions performed: Yes  SDOH Screenings   Alcohol Screen: Not on file  Depression (PHQ2-9): Low Risk    PHQ-2 Score: 0  Financial Resource Strain: Low Risk    Difficulty of Paying Living Expenses: Not hard at all  Food Insecurity: Not  on file  Housing: Not on file  Physical Activity: Not on file  Social Connections: Not on file  Stress: Not on file  Tobacco Use: Low Risk    Smoking Tobacco Use: Never   Smokeless Tobacco Use: Never  Transportation Needs: Not on file    [place]  Allergies  Allergen Reactions   Codeine Rash    fine rash    Medications Reviewed Today     Reviewed by Tomasa Blase, [facility] (Pharmacist) on 05/26/21 at [place] List Status: <None>   Medication Order Taking? Sig Documenting Provider Last Dose Status Informant  acetaminophen (TYLENOL) 325 MG tablet [id number] Yes Take 650 mg by mouth every  6 (six) hours as needed. [provider] Taking Active Self  amiodarone (PACERONE) 200 MG tablet [id number] Yes Take 1 tablet by mouth on Mon., Tues., Wed., Thurs., and Fri. Do not take on Sat. or Sun. Evans Lance, MD Taking Active   amLODipine (NORVASC) 10 MG tablet [id number] Yes Take 1 tablet (10 mg total) by mouth daily. Please call and schedule visit for Jan 2023 Hoyt Koch, MD Taking Active   diphenhydrAMINE [facility]) 25 MG tablet [id number] Yes Take 25 mg by mouth daily as needed for allergies (bee sting). [provider] Taking Active   EPINEPHrine 0.3 mg/0.3 mL IJ SOAJ injection [id number] No Inject 0.3 mLs (0.3 mg total) into the muscle as needed for anaphylaxis.  Patient not taking: Reported on 05/26/2021   Hoyt Koch, MD Not Taking Active   famotidine (PEPCID) 40 MG tablet [id number] Yes 40 mg daily as needed. Bee sting [provider] Taking Active   losartan (COZAAR) 100 MG tablet [id number] Yes TAKE 1 TABLET (100 MG TOTAL) BY MOUTH DAILY. Hoyt Koch, MD Taking Active   Multiple Vitamin (MULTIVITAMIN WITH MINERALS) TABS tablet [id number] Yes Take 1 tablet by mouth 2 (two) times daily. [provider] Taking Active Self  Multiple Vitamins-Minerals (VISION FORMULA/LUTEIN PO) [id number] Yes Take 1 tablet by mouth daily.  [provider] Taking Active   nitroGLYCERIN (NITROSTAT) 0.4 MG SL tablet [id number]  Place 1 tablet (0.4 mg total) under the tongue every 5 (five) minutes as needed for chest pain (MAX 3 TABLETS). Baldwin [place], PA-C  Consider Medication Status and Discontinue (Patient Preference) Self  polyethylene glycol (MIRALAX / GLYCOLAX) packet [id number] Yes Take 17 g by mouth daily as needed (constipation). [provider] Taking Active Self  Rivaroxaban (XARELTO) 15 MG TABS tablet [id number] Yes Take 1 tablet (15 mg total) by mouth daily. Hoyt Koch, MD Taking Active             Patient Active Problem List   Diagnosis Date Noted   Bee allergy status 04/18/2019   Routine health maintenance 12/01/2011   Hx of completed stroke 03/09/2011   Atrial fibrillation ([place]) 03/09/2011   Hyperlipidemia 10/09/2008   Essential hypertension 09/24/2007    Immunization History  Administered Date(s) Administered   Fluad Quad(high Dose 65+) 09/22/2019   Influenza Split 09/20/2011, 10/10/2012, 09/23/2013, 10/19/2015   Influenza Whole 09/16/2008, 09/27/2009, 10/18/2010   Influenza, High Dose Seasonal PF 10/12/2016, 09/01/2018   Influenza,inj,Quad PF,6+ Mos 09/17/2014, 10/09/2020   Influenza-Unspecified 09/04/2015, 09/03/2017   PFIZER(Purple Top)SARS-COV-2 Vaccination 02/17/2020, 03/09/2020, 09/18/2020   Pneumococcal Conjugate-13 12/18/2013   Pneumococcal Polysaccharide-23 12/23/2015   Td 11/09/2009   Tdap 04/18/2015   Zoster, Live 02/11/2009    Conditions to be addressed/monitored:  Hypertension, Hyperlipidemia, and Atrial Fibrillation  Care Plan : CCM Care Plan  Updates made by Tomasa Blase, RPH since 05/26/2021 12:00 AM     Problem: HTN, HLD, Afib   Priority: High  Onset Date: 05/26/2021     Long-Range Goal: Disease Management   Start Date: 05/26/2021  Expected End Date: 11/25/2021  This Visit's Progress: On track  Priority: High  Note:   Current Barriers:   Unable to independently monitor therapeutic efficacy  Pharmacist Clinical Goal(s):  Patient will verbalize ability to afford treatment regimen achieve adherence to monitoring guidelines and medication adherence to achieve therapeutic efficacy maintain control of blood pressure as evidenced by continued office blood pressure readings in office   contact provider office for questions/concerns as  evidenced notation of same in electronic health record through collaboration with PharmD and provider.   Interventions: 1:1 collaboration with Hoyt Koch, MD regarding development and update of comprehensive plan of care as evidenced by provider attestation and co-signature Inter-disciplinary care team collaboration (see longitudinal plan of care) Comprehensive medication review performed; medication list updated in electronic medical record  Hypertension (BP goal <140/90) -Controlled - Most recent blood pressures in office: 128/62 HR 70, 136/68 HR 78, 142/84 HR 79  -Current treatment: Amlodipine 52m daily  Losartan 1016mdaily  -Medications previously tried: diltiazem, valsartan, hctz  -Current home readings: n/a - does not have BP cuff at home  -Current dietary habits: son cooks most of her evening meals -does not watch sodium in cooking, drinks decaf coffee in the morning (2 cups) -Current exercise habits: limited, does walk throughout the house throughout the day and outside on occasion -Denies hypotensive/hypertensive symptoms -Educated on BP goals and benefits of medications for prevention of heart attack, stroke and kidney damage; Daily salt intake goal < 2300 mg; Exercise goal of 150 minutes per week; Symptoms of hypotension and importance of maintaining adequate hydration; -Counseled on diet and exercise extensively Recommended to continue current medication  Hyperlipidemia / History of Stroke: (LDL goal < 70) -Not ideally controlled - Last LDL 11662mL  (12/30/2020) -Current treatment: N/a -Medications previously tried: simvastatin   -Current dietary patterns: reports that she does eat fried foods, butter for much of her foods, does endorse consumption of fish regularly  -Current exercise habits: limited, does walk throughout the house a lot per daughter  -Educated on Cholesterol goals;  Benefits of statin for ASCVD risk reduction; Importance of limiting foods high in cholesterol; -Discussed with patient that she previously had taken simvastatin,last dose (2014) - stopped by Cardiology - patient declines side effects or issues when taking  - No medication changes at this time, patient to focus on changes to diet to help lower cholesterol  Atrial Fibrillation (Goal: prevent stroke and major bleeding) -Controlled -CHADSVASC: 6 (HTN, Age >75, Prior Stroke, and Female) -Current treatment: Rate control: Amiodarone 200m52m1 tablet M-F Anticoagulation: Xarelto 15mg77mly  -Medications previously tried: n/a -Home BP and HR readings: controlled with previous office visits, does not have blood pressure cuff at home   -Counseled on increased risk of stroke due to Afib and benefits of anticoagulation for stroke prevention; importance of adherence to anticoagulant exactly as prescribed; bleeding risk associated with Xarelto and importance of self-monitoring for signs/symptoms of bleeding; avoidance of NSAIDs due to increased bleeding risk with anticoagulants; seeking medical attention after a head injury or if there is blood in the urine/stool; -Recommended to continue current medication  Health Maintenance -Vaccine gaps: Shingles and COVID booster  -Current therapy:  Miralax 17g daily if needed  Acetaminophen - 650mg 38my 6 hours as needed  Multivitamin - Vision formula with lutein - 1 tablet daily  Multivitamin - 1 tablet daily Epinepherine 0.3mg/0.88m - used as needed for anaphylaxis  Famotidine - 40mg - 1mblet as needed for bee stings   Diphenhydramine 25 mg - 1 tablet as needed for bee stings  -Educated on Herbal supplement research is limited and benefits usually cannot be proven Cost vs benefit of each product must be carefully weighed by individual consumer -Patient is satisfied with current therapy and denies issues -Recommended to continue current medication  Patient Goals/Self-Care Activities Patient will:  - take medications as prescribed collaborate with provider on medication access solutions engage in dietary modifications  by reducing fried/ fatty food consumption, reduction of red meats / increase in fresh vegetables / reduction of sodium in diet   Follow Up Plan: Telephone follow up appointment with care management team member scheduled for: The patient has been provided with contact information for the care management team and has been advised to call with any health related questions or concerns.        Medication Assistance: None required.  Patient affirms current coverage meets needs.  Compliance/Adherence/Medication fill history: Care Gaps: Shingles and COVID booster   Patient's preferred pharmacy is:  [facility] [place], [address] [place] [address] Phone: [phone] Fax: [phone]  [facility]) - [place], [address] [place] [address] Phone: [phone] Fax: [phone]   Uses pill box? Yes Pt endorses 100% compliance  Care Plan and Follow Up Patient Decision:  Patient agrees to Care Plan and Follow-up.  Plan: Telephone follow up appointment with care management team member scheduled for:  3 months  and The patient has been provided with contact information for the care management team and has been advised to call with any health related questions or concerns.   Tomasa Blase, PharmD Clinical Pharmacist,  [place]      Current Chart Prep = 27 minutes

## 2021-05-26 NOTE — Patient Instructions (Addendum)
Visit Information   PATIENT GOALS:   Goals Addressed             This Visit's Progress    Lifestyle Change-Hypertension       Timeframe:  Long-Range Goal Priority:  High Start Date:  05/26/2021                           Expected End Date:  11/25/2021                    Follow Up Date 08/24/2021   - agree to work together to make changes - ask questions to understand - have a family meeting to talk about healthy habits - learn about high blood pressure    Why is this important?   The changes that you are asked to make may be hard to do.  This is especially true when the changes are life-long.  Knowing why it is important to you is the first step.  Working on the change with your family or support person helps you not feel alone.  Reward yourself and family or support person when goals are met. This can be an activity you choose like bowling, hiking, biking, swimming or shooting hoops.     Notes: Patient to reach out with symptoms of hypo/hypertension         Consent to CCM Services: [name] was given information about Chronic Care Management services today including:  CCM service includes personalized support from designated clinical staff supervised by her physician, including individualized plan of care and coordination with other care providers 24/7 contact phone numbers for assistance for urgent and routine care needs. Service will only be billed when office clinical staff spend 20 minutes or more in a month to coordinate care. Only one practitioner may furnish and bill the service in a calendar month. The patient may stop CCM services at any time (effective at the end of the month) by phone call to the office staff. The patient will be responsible for cost sharing (co-pay) of up to 20% of the service fee (after annual deductible is met).  Patient agreed to services and verbal consent obtained.   Patient verbalizes understanding of instructions provided today and  agrees to view in [employer].   Telephone follow up appointment with care management team member scheduled for: 3 months  The patient has been provided with contact information for the care management team and has been advised to call with any health related questions or concerns.   Tomasa Blase, PharmD Clinical Pharmacist, [place]   CLINICAL CARE PLAN: Patient Care Plan: CCM Care Plan     Problem Identified: HTN, HLD, Afib   Priority: High  Onset Date: 05/26/2021     Long-Range Goal: Disease Management   Start Date: 05/26/2021  Expected End Date: 11/25/2021  This Visit's Progress: On track  Priority: High  Note:   Current Barriers:  Unable to independently monitor therapeutic efficacy  Pharmacist Clinical Goal(s):  Patient will verbalize ability to afford treatment regimen achieve adherence to monitoring guidelines and medication adherence to achieve therapeutic efficacy maintain control of blood pressure as evidenced by continued office blood pressure readings in office   contact provider office for questions/concerns as evidenced notation of same in electronic health record through collaboration with PharmD and provider.   Interventions: 1:1 collaboration with Hoyt Koch, MD regarding development and update of comprehensive plan of care as evidenced  by provider attestation and co-signature Inter-disciplinary care team collaboration (see longitudinal plan of care) Comprehensive medication review performed; medication list updated in electronic medical record  Hypertension (BP goal <140/90) -Controlled - Most recent blood pressures in office: 128/62 HR 70, 136/68 HR 78, 142/84 HR 79  -Current treatment: Amlodipine 10mg  daily  Losartan 100mg  daily  -Medications previously tried: diltiazem, valsartan, hctz  -Current home readings: n/a - does not have BP cuff at home  -Current dietary habits: son cooks most of her evening meals -does not watch sodium  in cooking, drinks decaf coffee in the morning (2 cups) -Current exercise habits: limited, does walk throughout the house throughout the day and outside on occasion -Denies hypotensive/hypertensive symptoms -Educated on BP goals and benefits of medications for prevention of heart attack, stroke and kidney damage; Daily salt intake goal < 2300 mg; Exercise goal of 150 minutes per week; Symptoms of hypotension and importance of maintaining adequate hydration; -Counseled on diet and exercise extensively Recommended to continue current medication  Hyperlipidemia / History of Stroke: (LDL goal < 70) -Not ideally controlled - Last LDL 116mg /dL (12/30/2020) -Current treatment: N/a -Medications previously tried: simvastatin   -Current dietary patterns: reports that she does eat fried foods, butter for much of her foods, does endorse consumption of fish regularly  -Current exercise habits: limited, does walk throughout the house a lot per daughter  -Educated on Cholesterol goals;  Benefits of statin for ASCVD risk reduction; Importance of limiting foods high in cholesterol; -Discussed with patient that she previously had taken simvastatin,last dose (2014) - stopped by Cardiology - patient declines side effects or issues when taking  - No medication changes at this time, patient to focus on changes to diet to help lower cholesterol  Atrial Fibrillation (Goal: prevent stroke and major bleeding) -Controlled -CHADSVASC: 6 (HTN, Age >75, Prior Stroke, and Female) -Current treatment: Rate control: Amiodarone 200mg  - 1 tablet M-F Anticoagulation: Xarelto 15mg  daily  -Medications previously tried: n/a -Home BP and HR readings: controlled with previous office visits, does not have blood pressure cuff at home   -Counseled on increased risk of stroke due to Afib and benefits of anticoagulation for stroke prevention; importance of adherence to anticoagulant exactly as prescribed; bleeding risk associated  with Xarelto and importance of self-monitoring for signs/symptoms of bleeding; avoidance of NSAIDs due to increased bleeding risk with anticoagulants; seeking medical attention after a head injury or if there is blood in the urine/stool; -Recommended to continue current medication  Health Maintenance -Vaccine gaps: Shingles and COVID booster  -Current therapy:  Miralax 17g daily if needed  Acetaminophen - 650mg  every 6 hours as needed  Multivitamin - Vision formula with lutein - 1 tablet daily  Multivitamin - 1 tablet daily Epinepherine 0.3mg /0.67mL - used as needed for anaphylaxis  Famotidine - 40mg  - 1 tablet as needed for bee stings  Diphenhydramine 25 mg - 1 tablet as needed for bee stings  -Educated on Herbal supplement research is limited and benefits usually cannot be proven Cost vs benefit of each product must be carefully weighed by individual consumer -Patient is satisfied with current therapy and denies issues -Recommended to continue current medication  Patient Goals/Self-Care Activities Patient will:  - take medications as prescribed collaborate with provider on medication access solutions engage in dietary modifications by reducing fried/ fatty food consumption, reduction of red meats / increase in fresh vegetables / reduction of sodium in diet   Follow Up Plan: Telephone follow up appointment with care management team member  scheduled for: The patient has been provided with contact information for the care management team and has been advised to call with any health related questions or concerns.

## 2021-05-31 ENCOUNTER — Other Ambulatory Visit: Payer: Self-pay | Admitting: Internal Medicine

## 2021-05-31 ENCOUNTER — Ambulatory Visit: Payer: PPO

## 2021-05-31 ENCOUNTER — Other Ambulatory Visit: Payer: Self-pay

## 2021-05-31 DIAGNOSIS — I4821 Permanent atrial fibrillation: Secondary | ICD-10-CM

## 2021-05-31 DIAGNOSIS — E782 Mixed hyperlipidemia: Secondary | ICD-10-CM

## 2021-05-31 DIAGNOSIS — I10 Essential (primary) hypertension: Secondary | ICD-10-CM

## 2021-05-31 DIAGNOSIS — Z8673 Personal history of transient ischemic attack (TIA), and cerebral infarction without residual deficits: Secondary | ICD-10-CM

## 2021-05-31 MED ORDER — PRAVASTATIN SODIUM 40 MG PO TABS: 40.0000 mg | ORAL_TABLET | Freq: Every day | ORAL | 3 refills | Status: DC

## 2021-05-31 MED ORDER — EPINEPHRINE 0.3 MG/0.3ML IJ SOAJ: 0.3000 mg | INTRAMUSCULAR | 0 refills | Status: AC | PRN

## 2021-05-31 NOTE — Progress Notes (Signed)
Chronic Care Management Pharmacy Note  05/31/2021 Name:  [name] MRN:  [id number] DOB:  1928-06-25  Summary: - Patient agreeable to restarting statin for CVA prevention (previously had taken simvastatin -denied any issues or AE at that time)   Recommendations/Changes made from today's visit: -Restart statin therapy with pravastatin 35m daily due to less risk of interaction with amiodarone with major CYP3A4 substrates   Subjective: Savannah RIVERSis an 87y.o. year old female who is a primary patient of Savannah Koch MD.  The CCM team was consulted for assistance with disease management and care coordination needs.    Engaged with patient by telephone for follow up visit in response to provider referral for pharmacy case management and/or care coordination services.   Consent to Services:  The patient was given the following information about Chronic Care Management services today, agreed to services, and gave verbal consent: 1. CCM service includes personalized support from designated clinical staff supervised by the primary care provider, including individualized plan of care and coordination with other care providers 2. 24/7 contact phone numbers for assistance for urgent and routine care needs. 3. Service will only be billed when office clinical staff spend 20 minutes or more in a month to coordinate care. 4. Only one practitioner may furnish and bill the service in a calendar month. 5.The patient may stop CCM services at any time (effective at the end of the month) by phone call to the office staff. 6. The patient will be responsible for cost sharing (co-pay) of up to 20% of the service fee (after annual deductible is met). Patient agreed to services and consent obtained.  Patient Care Team: Savannah Koch MD as PCP - General (Internal Medicine) SDelice BisonDDarnelle Maffucci RThe [facility] LLCas Pharmacist (Pharmacist)  Recent office visits: 12/30/2020 - PCP visit - medicare wellness  and physical - no changes to medications  12/13/2020 - LJodi MourningFNP - televideo visit - 10-12 day history of deep cough - COVID negative   Recent consult visits: 02/22/2021 - Dr. GJule Ser- ophthalmology - macular degeneration  12/28/2020 - Dr. GJule Ser- ophthalmology - macular degeneration  12/14/2020 - Dr. TLovena Le- cardiology - restarting  [facility]: None in previous 6 months  Objective:  Lab Results  Component Value Date   CREATININE 1.35 (H) 12/30/2020   BUN 22 12/30/2020   GFR 34.56 (L) 12/30/2020   GFRNONAA 44 (L) 08/31/2018   GFRAA 50 (L) 08/31/2018   NA 137 12/30/2020   K 3.9 12/30/2020   CALCIUM 10.2 12/30/2020   CO2 25 12/30/2020   GLUCOSE 120 (H) 12/30/2020    Lab Results  Component Value Date/Time   GFR 34.56 (L) 12/30/2020 11:43 AM   GFR 41.44 (L) 12/15/2019 11:27 AM    Last diabetic Eye exam:  No results found for: HMDIABEYEEXA  Last diabetic Foot exam:  No results found for: HMDIABFOOTEX   Lab Results  Component Value Date   CHOL 231 (H) 12/30/2020   HDL 102.70 12/30/2020   LDLCALC 116 (H) 12/30/2020   LDLDIRECT 60.2 11/30/2011   TRIG 59.0 12/30/2020   CHOLHDL 2 12/30/2020    Hepatic Function Latest Ref Rng & Units 12/30/2020 12/15/2019 04/18/2019  Total Protein 6.0 - 8.3 g/dL 7.7 7.8 7.6  Albumin 3.5 - 5.2 g/dL 4.5 4.5 4.4  AST 0 - 37 U/L '21 20 19  ' ALT 0 - 35 U/L '15 14 15  ' Alk Phosphatase 39 - 117 U/L 57 61  68  Total Bilirubin 0.2 - 1.2 mg/dL 0.5 0.4 0.4  Bilirubin, Direct 0.0 - 0.3 mg/dL - - -    Lab Results  Component Value Date/Time   TSH 1.66 01/01/2018 03:06 PM   TSH 1.68 12/25/2016 02:52 PM   FREET4 1.41 12/25/2016 02:52 PM   FREET4 1.39 12/23/2015 02:43 PM    CBC Latest Ref Rng & Units 12/30/2020 12/15/2019 04/18/2019  WBC 4.0 - 10.5 K/uL 6.3 8.1 8.8  Hemoglobin 12.0 - 15.0 g/dL 13.6 13.5 13.9  Hematocrit 36.0 - 46.0 % 40.1 40.2 41.2  Platelets 150.0 - 400.0 K/uL 313.0 316.0 345.0    Lab Results  Component Value  Date/Time   VD25OH 19 (L) 04/06/2011 03:55 PM    Clinical ASCVD: Yes  The ASCVD Risk score Mikey Bussing DC Jr., et al., 2013) failed to calculate for the following reasons:   The 2013 ASCVD risk score is only valid for ages 63 to 11   The patient has a prior MI or stroke diagnosis    Depression screen [facility] 2/9 12/30/2020 01/10/2019 02/14/2016  Decreased Interest 0 0 0  Down, Depressed, Hopeless 0 0 0  PHQ - 2 Score 0 0 0    Social History   Tobacco Use  Smoking Status Never  Smokeless Tobacco Never   BP Readings from Last 3 Encounters:  12/30/20 128/62  12/14/20 136/68  12/15/19 (!) 142/84   Pulse Readings from Last 3 Encounters:  12/30/20 70  12/14/20 78  12/15/19 79   Wt Readings from Last 3 Encounters:  12/30/20 109 lb 9.6 oz (49.7 kg)  12/14/20 114 lb 12.8 oz (52.1 kg)  12/15/19 115 lb (52.2 kg)   BMI Readings from Last 3 Encounters:  12/30/20 20.71 kg/m  12/14/20 21.69 kg/m  12/15/19 21.73 kg/m    Assessment/Interventions: Review of patient past medical history, allergies, medications, health status, including review of consultants reports, laboratory and other test data, was performed as part of comprehensive evaluation and provision of chronic care management services.   SDOH:  (Social Determinants of Health) assessments and interventions performed: Yes  SDOH Screenings   Alcohol Screen: Not on file  Depression (PHQ2-9): Low Risk    PHQ-2 Score: 0  Financial Resource Strain: Low Risk    Difficulty of Paying Living Expenses: Not hard at all  Food Insecurity: Not on file  Housing: Not on file  Physical Activity: Not on file  Social Connections: Not on file  Stress: Not on file  Tobacco Use: Low Risk    Smoking Tobacco Use: Never   Smokeless Tobacco Use: Never  Transportation Needs: Not on file    [place]lu[place]  Allergies  Allergen Reactions   Codeine Rash    fine rash    Medications Reviewed Today     Reviewed by Tomasa Blase, [facility] (Pharmacist)  on 05/26/21 at [place] List Status: <None>   Medication Order Taking? Sig Documenting Provider Last Dose Status Informant  acetaminophen (TYLENOL) 325 MG tablet [id number] Yes Take 650 mg by mouth every 6 (six) hours as needed. [provider] Taking Active Self  amiodarone (PACERONE) 200 MG tablet [id number] Yes Take 1 tablet by mouth on Mon., Tues., Wed., Thurs., and Fri. Do not take on Sat. or Sun. Evans Lance, MD Taking Active   amLODipine (NORVASC) 10 MG tablet [id number] Yes Take 1 tablet (10 mg total) by mouth daily. Please call and schedule visit for Jan 2023 Hoyt Koch, MD Taking Active  diphenhydrAMINE (SOMINEX) 25 MG tablet [id number] Yes Take 25 mg by mouth daily as needed for allergies (bee sting). [provider] Taking Active   EPINEPHrine 0.3 mg/0.3 mL IJ SOAJ injection [id number] No Inject 0.3 mLs (0.3 mg total) into the muscle as needed for anaphylaxis.  Patient not taking: Reported on 05/26/2021   Hoyt Koch, MD Not Taking Active   famotidine (PEPCID) 40 MG tablet [id number] Yes 40 mg daily as needed. Bee sting [provider] Taking Active   losartan (COZAAR) 100 MG tablet [id number] Yes TAKE 1 TABLET (100 MG TOTAL) BY MOUTH DAILY. Hoyt Koch, MD Taking Active   Multiple Vitamin (MULTIVITAMIN WITH MINERALS) TABS tablet [id number] Yes Take 1 tablet by mouth 2 (two) times daily. [provider] Taking Active Self  Multiple Vitamins-Minerals (VISION FORMULA/LUTEIN PO) [id number] Yes Take 1 tablet by mouth daily. [provider] Taking Active   nitroGLYCERIN (NITROSTAT) 0.4 MG SL tablet [id number]  Place 1 tablet (0.4 mg total) under the tongue every 5 (five) minutes as needed for chest pain (MAX 3 TABLETS). Baldwin [place], PA-C  Consider Medication Status and Discontinue (Patient Preference) Self  polyethylene glycol (MIRALAX / GLYCOLAX) packet [id number] Yes Take 17 g by mouth daily as needed (constipation).  [provider] Taking Active Self  Rivaroxaban (XARELTO) 15 MG TABS tablet [id number] Yes Take 1 tablet (15 mg total) by mouth daily. Hoyt Koch, MD Taking Active             Patient Active Problem List   Diagnosis Date Noted   Bee allergy status 04/18/2019   Routine health maintenance 12/01/2011   Hx of completed stroke 03/09/2011   Atrial fibrillation ([place]) 03/09/2011   Hyperlipidemia 10/09/2008   Essential hypertension 09/24/2007    Immunization History  Administered Date(s) Administered   Fluad Quad(high Dose 65+) 09/22/2019   Influenza Split 09/20/2011, 10/10/2012, 09/23/2013, 10/19/2015   Influenza Whole 09/16/2008, 09/27/2009, 10/18/2010   Influenza, High Dose Seasonal PF 10/12/2016, 09/01/2018   Influenza,inj,Quad PF,6+ Mos 09/17/2014, 10/09/2020   Influenza-Unspecified 09/04/2015, 09/03/2017   PFIZER(Purple Top)SARS-COV-2 Vaccination 02/17/2020, 03/09/2020, 09/18/2020   Pneumococcal Conjugate-13 12/18/2013   Pneumococcal Polysaccharide-23 12/23/2015   Td 11/09/2009   Tdap 04/18/2015   Zoster, Live 02/11/2009    Conditions to be addressed/monitored:  Hypertension, Hyperlipidemia, and Atrial Fibrillation  Care Plan : CCM Care Plan  Updates made by Tomasa Blase, RPH since 05/31/2021 12:00 AM     Problem: HTN, HLD, Afib   Priority: High  Onset Date: 05/26/2021     Long-Range Goal: Disease Management   Start Date: 05/26/2021  Expected End Date: 11/25/2021  This Visit's Progress: On track  Recent Progress: On track  Priority: High  Note:   Current Barriers:  Unable to independently monitor therapeutic efficacy  Pharmacist Clinical Goal(s):  Patient will verbalize ability to afford treatment regimen achieve adherence to monitoring guidelines and medication adherence to achieve therapeutic efficacy maintain control of blood pressure as evidenced by continued office blood pressure readings in office   contact provider office for  questions/concerns as evidenced notation of same in electronic health record through collaboration with PharmD and provider.   Interventions: 1:1 collaboration with Hoyt Koch, MD regarding development and update of comprehensive plan of care as evidenced by provider attestation and co-signature Inter-disciplinary care team collaboration (see longitudinal plan of care) Comprehensive medication review performed; medication list updated in electronic medical record  Hypertension (BP goal <140/90) -Controlled - Most recent blood pressures in office:  128/62 HR 70, 136/68 HR 78, 142/84 HR 79  -Current treatment: Amlodipine 27m daily  Losartan 1020mdaily  -Medications previously tried: diltiazem, valsartan, hctz  -Current home readings: n/a - does not have BP cuff at home  -Current dietary habits: son cooks most of her evening meals -does not watch sodium in cooking, drinks decaf coffee in the morning (2 cups) -Current exercise habits: limited, does walk throughout the house throughout the day and outside on occasion -Denies hypotensive/hypertensive symptoms -Educated on BP goals and benefits of medications for prevention of heart attack, stroke and kidney damage; Daily salt intake goal < 2300 mg; Exercise goal of 150 minutes per week; Symptoms of hypotension and importance of maintaining adequate hydration; -Counseled on diet and exercise extensively Recommended to continue current medication  Hyperlipidemia / History of Stroke: (LDL goal < 70) -Not ideally controlled - Last LDL 11657mL (12/30/2020) -Current treatment: N/a -Medications previously tried: simvastatin   -Current dietary patterns: reports that she does eat fried foods, butter for much of her foods, does endorse consumption of fish regularly  -Current exercise habits: limited, does walk throughout the house a lot per daughter  -Educated on Cholesterol goals;  Benefits of statin for ASCVD risk  reduction; Importance of limiting foods high in cholesterol; - Patient agreeable to restart statin therapy - patient to start pravastatin 103m80mily (due to being a minor CYP3A4 substrate) - less risk of interaction with amiodarone than other statin drugs  - Plan to check liver function and lipid panel in 4-6 weeks   Atrial Fibrillation (Goal: prevent stroke and major bleeding) -Controlled -CHADSVASC: 6 (HTN, Age >75, Prior Stroke, and Female) -Current treatment: Rate control: Amiodarone 200mg12m tablet M-F Anticoagulation: Xarelto 15mg 32my  -Medications previously tried: n/a -Home BP and HR readings: controlled with previous office visits, does not have blood pressure cuff at home   -Counseled on increased risk of stroke due to Afib and benefits of anticoagulation for stroke prevention; importance of adherence to anticoagulant exactly as prescribed; bleeding risk associated with Xarelto and importance of self-monitoring for signs/symptoms of bleeding; avoidance of NSAIDs due to increased bleeding risk with anticoagulants; seeking medical attention after a head injury or if there is blood in the urine/stool; -Recommended to continue current medication  Health Maintenance -Vaccine gaps: Shingles and COVID booster  -Current therapy:  Miralax 17g daily if needed  Acetaminophen - 650mg e68m 6 hours as needed  Multivitamin - Vision formula with lutein - 1 tablet daily  Multivitamin - 1 tablet daily Epinepherine 0.3mg/0.356m- used as needed for anaphylaxis  Famotidine - 103mg - 176mlet as needed for bee stings  Diphenhydramine 25 mg - 1 tablet as needed for bee stings  -Educated on Herbal supplement research is limited and benefits usually cannot be proven Cost vs benefit of each product must be carefully weighed by individual consumer -Patient is satisfied with current therapy and denies issues -Recommended to continue current medication  Patient Goals/Self-Care Activities Patient  will:  - take medications as prescribed collaborate with provider on medication access solutions engage in dietary modifications by reducing fried/ fatty food consumption, reduction of red meats / increase in fresh vegetables / reduction of sodium in diet   Follow Up Plan: Telephone follow up appointment with care management team member scheduled for: 6 weeks  The patient has been provided with contact information for the care management team and has been advised to call with any health related questions or concerns.  Medication Assistance: None required.  Patient affirms current coverage meets needs.  Compliance/Adherence/Medication fill history: Care Gaps: Shingles and COVID booster   Patient's preferred pharmacy is:  [facility] [address] [place] [address] [address] Phone: [phone] Fax: [phone]  [facility]) - [place], [place] [address] Phone: [phone] Fax: [phone]   Uses pill box? Yes Pt endorses 100% compliance  Care Plan and Follow Up Patient Decision:  Patient agrees to Care Plan and Follow-up.  Plan: Telephone follow up appointment with care management team member scheduled for:  3 months  and The patient has been provided with contact information for the care management team and has been advised to call with any health related questions or concerns.   Tomasa Blase, PharmD Clinical Pharmacist, [place]

## 2021-05-31 NOTE — Patient Instructions (Signed)
Visit Information  PATIENT GOALS:  Goals Addressed             This Visit's Progress    Lifestyle Change-Hypertension   On track    Timeframe:  Long-Range Goal Priority:  High Start Date:  05/26/2021                           Expected End Date:  11/25/2021                    Follow Up Date 08/24/2021   - agree to work together to make changes - ask questions to understand - have a family meeting to talk about healthy habits - learn about high blood pressure    Why is this important?   The changes that you are asked to make may be hard to do.  This is especially true when the changes are life-long.  Knowing why it is important to you is the first step.  Working on the change with your family or support person helps you not feel alone.  Reward yourself and family or support person when goals are met. This can be an activity you choose like bowling, hiking, biking, swimming or shooting hoops.     Notes: Patient to reach out with symptoms of hypo/hypertension         Patient verbalizes understanding of instructions provided today and agrees to view in [place].   Telephone follow up appointment with care management team member scheduled for: 6 weeks  The patient has been provided with contact information for the care management team and has been advised to call with any health related questions or concerns.   Tomasa Blase, PharmD Clinical Pharmacist, [place]

## 2021-06-07 ENCOUNTER — Telehealth: Payer: Self-pay

## 2021-06-07 NOTE — Chronic Care Management (AMB) (Signed)
Patient's daughter Ilona Sorrel called to confirm that patient has received new prescription for pravastatin, will start today.  Plan at this time is to follow up in 4 weeks to discuss, patient to reach out if needed prior to next appointment   Tomasa Blase, PharmD Clinical Pharmacist, [place]     '

## 2021-06-21 DIAGNOSIS — H353211 Exudative age-related macular degeneration, right eye, with active choroidal neovascularization: Secondary | ICD-10-CM | POA: Diagnosis not present

## 2021-06-21 DIAGNOSIS — H353122 Nonexudative age-related macular degeneration, left eye, intermediate dry stage: Secondary | ICD-10-CM | POA: Diagnosis not present

## 2021-07-12 ENCOUNTER — Ambulatory Visit (INDEPENDENT_AMBULATORY_CARE_PROVIDER_SITE_OTHER): Payer: PPO

## 2021-07-12 ENCOUNTER — Other Ambulatory Visit: Payer: Self-pay

## 2021-07-12 DIAGNOSIS — Z8673 Personal history of transient ischemic attack (TIA), and cerebral infarction without residual deficits: Secondary | ICD-10-CM

## 2021-07-12 DIAGNOSIS — E782 Mixed hyperlipidemia: Secondary | ICD-10-CM

## 2021-07-12 DIAGNOSIS — I4821 Permanent atrial fibrillation: Secondary | ICD-10-CM

## 2021-07-12 DIAGNOSIS — I10 Essential (primary) hypertension: Secondary | ICD-10-CM

## 2021-07-12 NOTE — Progress Notes (Signed)
Chronic Care Management Pharmacy Note  07/12/2021 Name:  [name] MRN:  [id number] DOB:  Feb 12, 1928  Summary:  -Patient and daughter report that patient has had no issues since starting pravastatin, taking as prescribed  -Patient has not checked blood pressure since last visit but denies any issues with hypo/hypertension  -Was able to pick up epi-pen - has not had to use since picking up  Recommendations/Changes made from today's visit: -Patient to have lipid panel and CMP drawn now that she has been taking pravastatin for at least 4 weeks, will call to review results once obtained   Subjective: [name] is an 87 y.o. year old female who is a primary patient of Hoyt Koch, MD.  The CCM team was consulted for assistance with disease management and care coordination needs.    Engaged with patient by telephone for follow up visit in response to provider referral for pharmacy case management and/or care coordination services.   Consent to Services:  The patient was given the following information about Chronic Care Management services today, agreed to services, and gave verbal consent: 1. CCM service includes personalized support from designated clinical staff supervised by the primary care provider, including individualized plan of care and coordination with other care providers 2. 24/7 contact phone numbers for assistance for urgent and routine care needs. 3. Service will only be billed when office clinical staff spend 20 minutes or more in a month to coordinate care. 4. Only one practitioner may furnish and bill the service in a calendar month. 5.The patient may stop CCM services at any time (effective at the end of the month) by phone call to the office staff. 6. The patient will be responsible for cost sharing (co-pay) of up to 20% of the service fee (after annual deductible is met). Patient agreed to services and consent obtained.  Patient Care Team: Hoyt Koch, MD as PCP - General (Internal Medicine) Delice Bison, Darnelle Maffucci, [facility] as Pharmacist (Pharmacist)  Recent office visits: No changes from last visit   Recent consult visits: No changes from last visit   Hospital visits: None in previous 6 months  Objective:  Lab Results  Component Value Date   CREATININE 1.35 (H) 12/30/2020   BUN 22 12/30/2020   GFR 34.56 (L) 12/30/2020   GFRNONAA 44 (L) 08/31/2018   GFRAA 50 (L) 08/31/2018   NA 137 12/30/2020   K 3.9 12/30/2020   CALCIUM 10.2 12/30/2020   CO2 25 12/30/2020   GLUCOSE 120 (H) 12/30/2020    Lab Results  Component Value Date/Time   GFR 34.56 (L) 12/30/2020 11:43 AM   GFR 41.44 (L) 12/15/2019 11:27 AM    Last diabetic Eye exam:  No results found for: HMDIABEYEEXA  Last diabetic Foot exam:  No results found for: HMDIABFOOTEX   Lab Results  Component Value Date   CHOL 231 (H) 12/30/2020   HDL 102.70 12/30/2020   LDLCALC 116 (H) 12/30/2020   LDLDIRECT 60.2 11/30/2011   TRIG 59.0 12/30/2020   CHOLHDL 2 12/30/2020    Hepatic Function Latest Ref Rng & Units 12/30/2020 12/15/2019 04/18/2019  Total Protein 6.0 - 8.3 g/dL 7.7 7.8 7.6  Albumin 3.5 - 5.2 g/dL 4.5 4.5 4.4  AST 0 - 37 U/L '21 20 19  ' ALT 0 - 35 U/L '15 14 15  ' Alk Phosphatase 39 - 117 U/L 57 61 68  Total Bilirubin 0.2 - 1.2 mg/dL 0.5 0.4 0.4  Bilirubin, Direct 0.0 - 0.3 mg/dL - - -  Lab Results  Component Value Date/Time   TSH 1.66 01/01/2018 03:06 PM   TSH 1.68 12/25/2016 02:52 PM   FREET4 1.41 12/25/2016 02:52 PM   FREET4 1.39 12/23/2015 02:43 PM    CBC Latest Ref Rng & Units 12/30/2020 12/15/2019 04/18/2019  WBC 4.0 - 10.5 K/uL 6.3 8.1 8.8  Hemoglobin 12.0 - 15.0 g/dL 13.6 13.5 13.9  Hematocrit 36.0 - 46.0 % 40.1 40.2 41.2  Platelets 150.0 - 400.0 K/uL 313.0 316.0 345.0    Lab Results  Component Value Date/Time   VD25OH 19 (L) 04/06/2011 03:55 PM    Clinical ASCVD: Yes  The ASCVD Risk score Mikey Bussing DC Jr., et al., 2013) failed to calculate  for the following reasons:   The 2013 ASCVD risk score is only valid for ages 16 to 85   The patient has a prior MI or stroke diagnosis    Depression screen [facility] 2/9 12/30/2020 01/10/2019 02/14/2016  Decreased Interest 0 0 0  Down, Depressed, Hopeless 0 0 0  PHQ - 2 Score 0 0 0    Social History   Tobacco Use  Smoking Status Never  Smokeless Tobacco Never   BP Readings from Last 3 Encounters:  12/30/20 128/62  12/14/20 136/68  12/15/19 (!) 142/84   Pulse Readings from Last 3 Encounters:  12/30/20 70  12/14/20 78  12/15/19 79   Wt Readings from Last 3 Encounters:  12/30/20 109 lb 9.6 oz (49.7 kg)  12/14/20 114 lb 12.8 oz (52.1 kg)  12/15/19 115 lb (52.2 kg)   BMI Readings from Last 3 Encounters:  12/30/20 20.71 kg/m  12/14/20 21.69 kg/m  12/15/19 21.73 kg/m    Assessment/Interventions: Review of patient past medical history, allergies, medications, health status, including review of consultants reports, laboratory and other test data, was performed as part of comprehensive evaluation and provision of chronic care management services.   SDOH:  (Social Determinants of Health) assessments and interventions performed: Yes  SDOH Screenings   Alcohol Screen: Not on file  Depression (PHQ2-9): Low Risk    PHQ-2 Score: 0  Financial Resource Strain: Low Risk    Difficulty of Paying Living Expenses: Not hard at all  Food Insecurity: Not on file  Housing: Not on file  Physical Activity: Not on file  Social Connections: Not on file  Stress: Not on file  Tobacco Use: Low Risk    Smoking Tobacco Use: Never   Smokeless Tobacco Use: Never  Transportation Needs: Not on file    [place]  Allergies  Allergen Reactions   Codeine Rash    fine rash    Medications Reviewed Today     Reviewed by Tomasa Blase, [facility] (Pharmacist) on 07/12/21 at 1532  Med List Status: <None>   Medication Order Taking? Sig Documenting Provider Last Dose Status Informant  acetaminophen  (TYLENOL) 325 MG tablet [id number] Yes Take 650 mg by mouth every 6 (six) hours as needed. [provider] Taking Active Self  amiodarone (PACERONE) 200 MG tablet [id number] Yes Take 1 tablet by mouth on Mon., Tues., Wed., Thurs., and Fri. Do not take on Sat. or Sun. Evans Lance, MD Taking Active   amLODipine (NORVASC) 10 MG tablet [id number] Yes Take 1 tablet (10 mg total) by mouth daily. Please call and schedule visit for Jan 2023 Hoyt Koch, MD Taking Active   diphenhydrAMINE [facility]) 25 MG tablet [id number] Yes Take 25 mg by mouth daily as needed for allergies (bee sting). [provider] Taking Active  EPINEPHrine 0.3 mg/0.3 mL IJ SOAJ injection [id number] No Inject 0.3 mg into the muscle as needed for anaphylaxis.  Patient not taking: Reported on 07/12/2021   Hoyt Koch, MD Not Taking Active   famotidine (PEPCID) 40 MG tablet [id number] Yes 40 mg daily as needed. Bee sting [provider] Taking Active   losartan (COZAAR) 100 MG tablet [id number] Yes TAKE 1 TABLET (100 MG TOTAL) BY MOUTH DAILY. Hoyt Koch, MD Taking Active   Multiple Vitamin (MULTIVITAMIN WITH MINERALS) TABS tablet [id number] Yes Take 1 tablet by mouth 2 (two) times daily. [provider] Taking Active Self  Multiple Vitamins-Minerals (VISION FORMULA/LUTEIN PO) [id number] Yes Take 1 tablet by mouth daily. [provider] Taking Active   nitroGLYCERIN (NITROSTAT) 0.4 MG SL tablet [id number] No Place 1 tablet (0.4 mg total) under the tongue every 5 (five) minutes as needed for chest pain (MAX 3 TABLETS).  Patient not taking: Reported on 07/12/2021   Baldwin [place], PA-C Not Taking Active Self  polyethylene glycol [facility] / GLYCOLAX) packet [id number] Yes Take 17 g by mouth daily as needed (constipation). [provider] Taking Active Self  pravastatin (PRAVACHOL) 40 MG tablet [id number] Yes Take 1 tablet (40 mg total) by mouth daily. Hoyt Koch, MD Taking Active   Rivaroxaban (XARELTO) 15 MG TABS tablet [id number] Yes Take 1 tablet (15 mg total) by mouth daily. Hoyt Koch, MD Taking Active             Patient Active Problem List   Diagnosis Date Noted   Bee allergy status 04/18/2019   Routine health maintenance 12/01/2011   Hx of completed stroke 03/09/2011   Atrial fibrillation ([place]) 03/09/2011   Hyperlipidemia 10/09/2008   Essential hypertension 09/24/2007    Immunization History  Administered Date(s) Administered   Fluad Quad(high Dose 65+) 09/22/2019   Influenza Split 09/20/2011, 10/10/2012, 09/23/2013, 10/19/2015   Influenza Whole 09/16/2008, 09/27/2009, 10/18/2010   Influenza, High Dose Seasonal PF 10/12/2016, 09/01/2018   Influenza,inj,Quad PF,6+ Mos 09/17/2014, 10/09/2020   Influenza-Unspecified 09/04/2015, 09/03/2017   PFIZER(Purple Top)SARS-COV-2 Vaccination 02/17/2020, 03/09/2020, 09/18/2020   Pneumococcal Conjugate-13 12/18/2013   Pneumococcal Polysaccharide-23 12/23/2015   Td 11/09/2009   Tdap 04/18/2015   Zoster, Live 02/11/2009    Conditions to be addressed/monitored:  Hypertension, Hyperlipidemia, and Atrial Fibrillation  Care Plan : CCM Care Plan  Updates made by Tomasa Blase, RPH since 07/12/2021 12:00 AM     Problem: HTN, HLD, Afib   Priority: High  Onset Date: 05/26/2021     Long-Range Goal: Disease Management   Start Date: 05/26/2021  Expected End Date: 11/25/2021  This Visit's Progress: On track  Recent Progress: On track  Priority: High  Note:   Current Barriers:  Unable to independently monitor therapeutic efficacy  Pharmacist Clinical Goal(s):  Patient will verbalize ability to afford treatment regimen achieve adherence to monitoring guidelines and medication adherence to achieve therapeutic efficacy maintain control of blood pressure as evidenced by continued office blood pressure readings in office   contact provider office for questions/concerns  as evidenced notation of same in electronic health record through collaboration with PharmD and provider.   Interventions: 1:1 collaboration with Hoyt Koch, MD regarding development and update of comprehensive plan of care as evidenced by provider attestation and co-signature Inter-disciplinary care team collaboration (see longitudinal plan of care) Comprehensive medication review performed; medication list updated in electronic medical record  Hypertension (BP goal <140/90) -Controlled - Most recent blood pressures in office: 128/62  HR 70, 136/68 HR 78, 142/84 HR 79  -Current treatment: Amlodipine 60m daily  Losartan 1060mdaily  -Medications previously tried: diltiazem, valsartan, hctz  -Current home readings: n/a - does not have BP cuff at home  -Current dietary habits: son cooks most of her evening meals -does not watch sodium in cooking, drinks decaf coffee in the morning (2 cups) -Current exercise habits: limited, does walk throughout the house throughout the day and outside on occasion -Denies hypotensive/hypertensive symptoms -Educated on BP goals and benefits of medications for prevention of heart attack, stroke and kidney damage; Daily salt intake goal < 2300 mg; Exercise goal of 150 minutes per week; Symptoms of hypotension and importance of maintaining adequate hydration; -Counseled on diet and exercise extensively Recommended to continue current medication  Hyperlipidemia / History of Stroke: (LDL goal < 70) -Not ideally controlled - Last LDL 11660mL (12/30/2020) -Current treatment: Pravastatin 76m61m1 tablet daily  -Medications previously tried: simvastatin   -Current dietary patterns: reports that she does eat fried foods, butter for much of her foods, does endorse consumption of fish regularly  -Current exercise habits: limited, does walk throughout the house a lot per daughter  -Educated on Cholesterol goals;  Benefits of statin for ASCVD risk  reduction; Importance of limiting foods high in cholesterol; - Plan to check liver function lipid panel later this week, will follow up with patient and daughter about results   Atrial Fibrillation (Goal: prevent stroke and major bleeding) -Controlled -CHADSVASC: 6 (HTN, Age >75, Prior Stroke, and Female) -Current treatment: Rate control: Amiodarone 200mg43m tablet M-F Anticoagulation: Xarelto 15mg 79my  -Medications previously tried: n/a -Home BP and HR readings: controlled with previous office visits, does not have blood pressure cuff at home   -Counseled on increased risk of stroke due to Afib and benefits of anticoagulation for stroke prevention; importance of adherence to anticoagulant exactly as prescribed; bleeding risk associated with Xarelto and importance of self-monitoring for signs/symptoms of bleeding; avoidance of NSAIDs due to increased bleeding risk with anticoagulants; seeking medical attention after a head injury or if there is blood in the urine/stool; -Recommended to continue current medication  Health Maintenance -Vaccine gaps: Shingles and COVID booster  -Current therapy:  Miralax 17g daily if needed  Acetaminophen - 650mg e66m 6 hours as needed  Multivitamin - Vision formula with lutein - 1 tablet daily  Multivitamin - 1 tablet daily Epinepherine 0.3mg/0.375m- used as needed for anaphylaxis  Famotidine - 76mg - 124mlet as needed for bee stings  Diphenhydramine 25 mg - 1 tablet as needed for bee stings  -Educated on Herbal supplement research is limited and benefits usually cannot be proven Cost vs benefit of each product must be carefully weighed by individual consumer -Patient is satisfied with current therapy and denies issues -Recommended to continue current medication  Patient Goals/Self-Care Activities Patient will:  - take medications as prescribed collaborate with provider on medication access solutions engage in dietary modifications by  reducing fried/ fatty food consumption, reduction of red meats / increase in fresh vegetables / reduction of sodium in diet   Follow Up Plan: Telephone follow up appointment with care management team member scheduled for: 1 week The patient has been provided with contact information for the care management team and has been advised to call with any health related questions or concerns.         Medication Assistance: None required.  Patient affirms current coverage meets needs.  Compliance/Adherence/Medication fill history: Care Gaps:  Shingles and COVID booster   Patient's preferred pharmacy is:  [id number] [address], [address] [address] Phone: [phone] Fax: [phone]  [facility]) - [address] [place] [address] Phone: [phone] Fax: [phone]   Uses pill box? Yes Pt endorses 100% compliance  Care Plan and Follow Up Patient Decision:  Patient agrees to Care Plan and Follow-up.  Plan: Telephone follow up appointment with care management team member scheduled for:  3 months  and The patient has been provided with contact information for the care management team and has been advised to call with any health related questions or concerns.   Tomasa Blase, PharmD Clinical Pharmacist, [place]

## 2021-07-12 NOTE — Patient Instructions (Signed)
Visit Information  PATIENT GOALS:  Goals Addressed             This Visit's Progress    Lifestyle Change-Hypertension   On track    Timeframe:  Long-Range Goal Priority:  High Start Date:  05/26/2021                           Expected End Date:  11/25/2021                    Follow Up Date 08/24/2021   - agree to work together to make changes - ask questions to understand - have a family meeting to talk about healthy habits - learn about high blood pressure    Why is this important?   The changes that you are asked to make may be hard to do.  This is especially true when the changes are life-long.  Knowing why it is important to you is the first step.  Working on the change with your family or support person helps you not feel alone.  Reward yourself and family or support person when goals are met. This can be an activity you choose like bowling, hiking, biking, swimming or shooting hoops.     Notes: Patient to reach out with symptoms of hypo/hypertension        Patient verbalizes understanding of instructions provided today and agrees to view in [place].   Telephone follow up appointment with care management team member scheduled for: 1 week The patient has been provided with contact information for the care management team and has been advised to call with any health related questions or concerns.   Tomasa Blase, PharmD Clinical Pharmacist, [place]

## 2021-07-14 ENCOUNTER — Other Ambulatory Visit: Payer: Self-pay | Admitting: Internal Medicine

## 2021-07-14 DIAGNOSIS — I10 Essential (primary) hypertension: Secondary | ICD-10-CM

## 2021-07-15 ENCOUNTER — Other Ambulatory Visit: Payer: Self-pay

## 2021-07-15 ENCOUNTER — Other Ambulatory Visit (INDEPENDENT_AMBULATORY_CARE_PROVIDER_SITE_OTHER): Payer: PPO

## 2021-07-15 DIAGNOSIS — I10 Essential (primary) hypertension: Secondary | ICD-10-CM | POA: Diagnosis not present

## 2021-07-15 LAB — COMPREHENSIVE METABOLIC PANEL
ALT: 19 U/L (ref 0–35)
AST: 22 U/L (ref 0–37)
Albumin: 4.3 g/dL (ref 3.5–5.2)
Alkaline Phosphatase: 60 U/L (ref 39–117)
BUN: 22 mg/dL (ref 6–23)
CO2: 26 mEq/L (ref 19–32)
Calcium: 9.8 mg/dL (ref 8.4–10.5)
Chloride: 103 mEq/L (ref 96–112)
Creatinine, Ser: 1.29 mg/dL — ABNORMAL HIGH (ref 0.40–1.20)
GFR: 36.36 mL/min — ABNORMAL LOW (ref >60.00)
Glucose, Bld: 85 mg/dL (ref 70–99)
Potassium: 4 mEq/L (ref 3.5–5.1)
Sodium: 139 mEq/L (ref 135–145)
Total Bilirubin: 0.6 mg/dL (ref 0.2–1.2)
Total Protein: 7.3 g/dL (ref 6.0–8.3)

## 2021-07-15 LAB — LIPID PANEL
Cholesterol: 159 mg/dL (ref 0–200)
HDL: 99 mg/dL (ref >39.00)
LDL Cholesterol: 52 mg/dL (ref 0–99)
NonHDL: 60.33
Total CHOL/HDL Ratio: 2
Triglycerides: 41 mg/dL (ref 0.0–149.0)
VLDL: 8.2 mg/dL (ref 0.0–40.0)

## 2021-07-18 ENCOUNTER — Ambulatory Visit: Payer: PPO

## 2021-07-18 ENCOUNTER — Other Ambulatory Visit: Payer: Self-pay

## 2021-07-18 DIAGNOSIS — I10 Essential (primary) hypertension: Secondary | ICD-10-CM

## 2021-07-18 DIAGNOSIS — Z8673 Personal history of transient ischemic attack (TIA), and cerebral infarction without residual deficits: Secondary | ICD-10-CM

## 2021-07-18 DIAGNOSIS — E782 Mixed hyperlipidemia: Secondary | ICD-10-CM

## 2021-07-18 DIAGNOSIS — I4821 Permanent atrial fibrillation: Secondary | ICD-10-CM

## 2021-07-18 NOTE — Patient Instructions (Signed)
Visit Information  PATIENT GOALS:  Goals Addressed             This Visit's Progress    Lifestyle Change-Hypertension   On track    Timeframe:  Long-Range Goal Priority:  High Start Date:  05/26/2021                           Expected End Date:  11/25/2021                    Follow Up Date 08/24/2021   - agree to work together to make changes - ask questions to understand - have a family meeting to talk about healthy habits - learn about high blood pressure    Why is this important?   The changes that you are asked to make may be hard to do.  This is especially true when the changes are life-long.  Knowing why it is important to you is the first step.  Working on the change with your family or support person helps you not feel alone.  Reward yourself and family or support person when goals are met. This can be an activity you choose like bowling, hiking, biking, swimming or shooting hoops.     Notes: Patient to reach out with symptoms of hypo/hypertension        Patient verbalizes understanding of instructions provided today and agrees to view in [place].   Telephone follow up appointment with care management team member scheduled for: 6 months The patient has been provided with contact information for the care management team and has been advised to call with any health related questions or concerns.   Tomasa Blase, PharmD Clinical Pharmacist, [place]

## 2021-07-18 NOTE — Progress Notes (Signed)
Chronic Care Management Pharmacy Note  07/18/2021 Name:  [name] MRN:  [id number] DOB:  [birth year]  Summary: - Patient had labs completed 07/15/2021 - LDL has improved to 67m/dL, kidney function remains stable, LFTs normal, patient reports no issues since starting pravastatin   Recommendations/Changes made from today's visit: - continue pravastatin, patient to reach out with any issues or concerns with current medications  Subjective: Savannah TIJERINOis an 87y.o. year old female who is a primary patient of Savannah Koch MD.  The CCM team was consulted for assistance with disease management and care coordination needs.    Engaged with patient by telephone for follow up visit in response to provider referral for pharmacy case management and/or care coordination services.   Consent to Services:  The patient was given the following information about Chronic Care Management services today, agreed to services, and gave verbal consent: 1. CCM service includes personalized support from designated clinical staff supervised by the primary care provider, including individualized plan of care and coordination with other care providers 2. 24/7 contact phone numbers for assistance for urgent and routine care needs. 3. Service will only be billed when office clinical staff spend 20 minutes or more in a month to coordinate care. 4. Only one practitioner may furnish and bill the service in a calendar month. 5.The patient may stop CCM services at any time (effective at the end of the month) by phone call to the office staff. 6. The patient will be responsible for cost sharing (co-pay) of up to 20% of the service fee (after annual deductible is met). Patient agreed to services and consent obtained.  Patient Care Team: Savannah Koch MD as PCP - General (Internal Medicine) SDelice Bison DDarnelle Maffucci RAlex[facility] Medical Centeras Pharmacist (Pharmacist)  Recent office visits: No changes from last visit   Recent  consult visits: No changes from last visit   Hospital visits: None in previous 6 months  Objective:  Lab Results  Component Value Date   CREATININE 1.29 (H) 07/15/2021   BUN 22 07/15/2021   GFR 36.36 (L) 07/15/2021   GFRNONAA 44 (L) 08/31/2018   GFRAA 50 (L) 08/31/2018   NA 139 07/15/2021   K 4.0 07/15/2021   CALCIUM 9.8 07/15/2021   CO2 26 07/15/2021   GLUCOSE 85 07/15/2021    Lab Results  Component Value Date/Time   GFR 36.36 (L) 07/15/2021 09:53 AM   GFR 34.56 (L) 12/30/2020 11:43 AM    Last diabetic Eye exam:  No results found for: HMDIABEYEEXA  Last diabetic Foot exam:  No results found for: HMDIABFOOTEX   Lab Results  Component Value Date   CHOL 159 07/15/2021   HDL 99.00 07/15/2021   LDLCALC 52 07/15/2021   LDLDIRECT 60.2 11/30/2011   TRIG 41.0 07/15/2021   CHOLHDL 2 07/15/2021    Hepatic Function Latest Ref Rng & Units 07/15/2021 12/30/2020 12/15/2019  Total Protein 6.0 - 8.3 g/dL 7.3 7.7 7.8  Albumin 3.5 - 5.2 g/dL 4.3 4.5 4.5  AST 0 - 37 U/L _0 ALT 0 - 35 U/L _1 Alk Phosphatase 39 - 117 U/L 60 57 61  Total Bilirubin 0.2 - 1.2 mg/dL 0.6 0.5 0.4  Bilirubin, Direct 0.0 - 0.3 mg/dL - - -    Lab Results  Component Value Date/Time   TSH 1.66 01/01/2018 03:06 PM   TSH 1.68 12/25/2016 02:52 PM   FREET4 1.41 12/25/2016 02:52 PM   FREET4 1.39 12/23/2015 02:43  PM    CBC Latest Ref Rng & Units 12/30/2020 12/15/2019 04/18/2019  WBC 4.0 - 10.5 K/uL 6.3 8.1 8.8  Hemoglobin 12.0 - 15.0 g/dL 13.6 13.5 13.9  Hematocrit 36.0 - 46.0 % 40.1 40.2 41.2  Platelets 150.0 - 400.0 K/uL 313.0 316.0 345.0    Lab Results  Component Value Date/Time   VD25OH 19 (L) 04/06/2011 03:55 PM    Clinical ASCVD: Yes  The ASCVD Risk score Mikey Bussing DC Jr., et al., 2013) failed to calculate for the following reasons:   The 2013 ASCVD risk score is only valid for ages 20 to 63   The patient has a prior MI or stroke diagnosis    Depression screen [facility] 2/9 12/30/2020  01/10/2019 02/14/2016  Decreased Interest 0 0 0  Down, Depressed, Hopeless 0 0 0  PHQ - 2 Score 0 0 0    Social History   Tobacco Use  Smoking Status Never  Smokeless Tobacco Never   BP Readings from Last 3 Encounters:  12/30/20 128/62  12/14/20 136/68  12/15/19 (!) 142/84   Pulse Readings from Last 3 Encounters:  12/30/20 70  12/14/20 78  12/15/19 79   Wt Readings from Last 3 Encounters:  12/30/20 109 lb 9.6 oz (49.7 kg)  12/14/20 114 lb 12.8 oz (52.1 kg)  12/15/19 115 lb (52.2 kg)   BMI Readings from Last 3 Encounters:  12/30/20 20.71 kg/m  12/14/20 21.69 kg/m  12/15/19 21.73 kg/m    Assessment/Interventions: Review of patient past medical history, allergies, medications, health status, including review of consultants reports, laboratory and other test data, was performed as part of comprehensive evaluation and provision of chronic care management services.   SDOH:  (Social Determinants of Health) assessments and interventions performed: Yes  SDOH Screenings   Alcohol Screen: Not on file  Depression (PHQ2-9): Low Risk    PHQ-2 Score: 0  Financial Resource Strain: Low Risk    Difficulty of Paying Living Expenses: Not hard at all  Food Insecurity: Not on file  Housing: Not on file  Physical Activity: Not on file  Social Connections: Not on file  Stress: Not on file  Tobacco Use: Low Risk    Smoking Tobacco Use: Never   Smokeless Tobacco Use: Never  Transportation Needs: Not on file    [place]  Allergies  Allergen Reactions   Codeine Rash    fine rash    Medications Reviewed Today     Reviewed by Tomasa Blase, [facility] (Pharmacist) on 07/12/21 at 1532  Med List Status: <None>   Medication Order Taking? Sig Documenting Provider Last Dose Status Informant  acetaminophen (TYLENOL) 325 MG tablet [id number] Yes Take 650 mg by mouth every 6 (six) hours as needed. [provider] Taking Active Self  amiodarone (PACERONE) 200 MG tablet  [id number] Yes Take 1 tablet by mouth on Mon., Tues., Wed., Thurs., and Fri. Do not take on Sat. or Sun. Evans Lance, MD Taking Active   amLODipine (NORVASC) 10 MG tablet [id number] Yes Take 1 tablet (10 mg total) by mouth daily. Please call and schedule visit for Jan 2023 Hoyt Koch, MD Taking Active   diphenhydrAMINE [facility]) 25 MG tablet [id number] Yes Take 25 mg by mouth daily as needed for allergies (bee sting). [provider] Taking Active   EPINEPHrine 0.3 mg/0.3 mL IJ SOAJ injection [id number] No Inject 0.3 mg into the muscle as needed for anaphylaxis.  Patient not taking: Reported on 07/12/2021   Hoyt Koch,  MD Not Taking Active   famotidine (PEPCID) 40 MG tablet [id number] Yes 40 mg daily as needed. Bee sting [provider] Taking Active   losartan (COZAAR) 100 MG tablet [id number] Yes TAKE 1 TABLET (100 MG TOTAL) BY MOUTH DAILY. Hoyt Koch, MD Taking Active   Multiple Vitamin (MULTIVITAMIN WITH MINERALS) TABS tablet [id number] Yes Take 1 tablet by mouth 2 (two) times daily. [provider] Taking Active Self  Multiple Vitamins-Minerals (VISION FORMULA/LUTEIN PO) [id number] Yes Take 1 tablet by mouth daily. [provider] Taking Active   nitroGLYCERIN (NITROSTAT) 0.4 MG SL tablet [id number] No Place 1 tablet (0.4 mg total) under the tongue every 5 (five) minutes as needed for chest pain (MAX 3 TABLETS).  Patient not taking: Reported on 07/12/2021   Baldwin [place], PA-C Not Taking Active Self  polyethylene glycol [facility] / GLYCOLAX) packet [id number] Yes Take 17 g by mouth daily as needed (constipation). [provider] Taking Active Self  pravastatin (PRAVACHOL) 40 MG tablet [id number] Yes Take 1 tablet (40 mg total) by mouth daily. Hoyt Koch, MD Taking Active   Rivaroxaban (XARELTO) 15 MG TABS tablet [id number] Yes Take 1 tablet (15 mg total) by mouth daily. Hoyt Koch, MD Taking Active              Patient Active Problem List   Diagnosis Date Noted   Bee allergy status 04/18/2019   Routine health maintenance 12/01/2011   Hx of completed stroke 03/09/2011   Atrial fibrillation ([place]) 03/09/2011   Hyperlipidemia 10/09/2008   Essential hypertension 09/24/2007    Immunization History  Administered Date(s) Administered   Fluad Quad(high Dose 65+) 09/22/2019   Influenza Split 09/20/2011, 10/10/2012, 09/23/2013, 10/19/2015   Influenza Whole 09/16/2008, 09/27/2009, 10/18/2010   Influenza, High Dose Seasonal PF 10/12/2016, 09/01/2018   Influenza,inj,Quad PF,6+ Mos 09/17/2014, 10/09/2020   Influenza-Unspecified 09/04/2015, 09/03/2017   PFIZER(Purple Top)SARS-COV-2 Vaccination 02/17/2020, 03/09/2020, 09/18/2020   Pneumococcal Conjugate-13 12/18/2013   Pneumococcal Polysaccharide-23 12/23/2015   Td 11/09/2009   Tdap 04/18/2015   Zoster, Live 02/11/2009    Conditions to be addressed/monitored:  Hypertension, Hyperlipidemia, and Atrial Fibrillation  Care Plan : CCM Care Plan  Updates made by Tomasa Blase, RPH since 07/18/2021 12:00 AM     Problem: HTN, HLD, Afib   Priority: High  Onset Date: 05/26/2021     Long-Range Goal: Disease Management   Start Date: 05/26/2021  Expected End Date: 11/25/2021  This Visit's Progress: On track  Recent Progress: On track  Priority: High  Note:   Current Barriers:  Unable to independently monitor therapeutic efficacy  Pharmacist Clinical Goal(s):  Patient will verbalize ability to afford treatment regimen achieve adherence to monitoring guidelines and medication adherence to achieve therapeutic efficacy maintain control of blood pressure as evidenced by continued office blood pressure readings in office   contact provider office for questions/concerns as evidenced notation of same in electronic health record through collaboration with PharmD and provider.   Interventions: 1:1 collaboration with Hoyt Koch, MD  regarding development and update of comprehensive plan of care as evidenced by provider attestation and co-signature Inter-disciplinary care team collaboration (see longitudinal plan of care) Comprehensive medication review performed; medication list updated in electronic medical record  Hypertension (BP goal <140/90) -Controlled - Most recent blood pressures in office: 128/62 HR 70, 136/68 HR 78, 142/84 HR 79  -Current treatment: Amlodipine 39m daily  Losartan 1069mdaily  -Medications previously tried: diltiazem, valsartan, hctz  -Current home readings: n/a -  does not have BP cuff at home  -Current dietary habits: son cooks most of her evening meals -does not watch sodium in cooking, drinks decaf coffee in the morning (2 cups) -Current exercise habits: limited, does walk throughout the house throughout the day and outside on occasion -Denies hypotensive/hypertensive symptoms -Educated on BP goals and benefits of medications for prevention of heart attack, stroke and kidney damage; Daily salt intake goal < 2300 mg; Exercise goal of 150 minutes per week; Symptoms of hypotension and importance of maintaining adequate hydration; -Counseled on diet and exercise extensively Recommended to continue current medication  Hyperlipidemia / History of Stroke: (LDL goal < 70) -Not ideally controlled - Last LDL 59m/dL (07/18/2021) -Current treatment: Pravastatin 43m- 1 tablet daily  -Medications previously tried: simvastatin   -Current dietary patterns: reports that she does eat fried foods, butter for much of her foods, does endorse consumption of fish regularly  -Current exercise habits: limited, does walk throughout the house a lot per daughter  -Educated on Cholesterol goals;  Benefits of statin for ASCVD risk reduction; Importance of limiting foods high in cholesterol;  Atrial Fibrillation (Goal: prevent stroke and major bleeding) -Controlled -CHADSVASC: 6 (HTN, Age >75, Prior Stroke,  and Female) -Current treatment: Rate control: Amiodarone 20052m 1 tablet M-F Anticoagulation: Xarelto 48m64mily  -Medications previously tried: n/a -Home BP and HR readings: controlled with previous office visits, does not have blood pressure cuff at home   -Counseled on increased risk of stroke due to Afib and benefits of anticoagulation for stroke prevention; importance of adherence to anticoagulant exactly as prescribed; bleeding risk associated with Xarelto and importance of self-monitoring for signs/symptoms of bleeding; avoidance of NSAIDs due to increased bleeding risk with anticoagulants; seeking medical attention after a head injury or if there is blood in the urine/stool; -Recommended to continue current medication  Health Maintenance -Vaccine gaps: Shingles and COVID booster  -Current therapy:  Miralax 17g daily if needed  Acetaminophen - 650mg73mry 6 hours as needed  Multivitamin - Vision formula with lutein - 1 tablet daily  Multivitamin - 1 tablet daily Epinepherine 0.3mg/081mL - used as needed for anaphylaxis  Famotidine - 40mg -21mablet as needed for bee stings  Diphenhydramine 25 mg - 1 tablet as needed for bee stings  -Educated on Herbal supplement research is limited and benefits usually cannot be proven Cost vs benefit of each product must be carefully weighed by individual consumer -Patient is satisfied with current therapy and denies issues -Recommended to continue current medication  Patient Goals/Self-Care Activities Patient will:  - take medications as prescribed collaborate with provider on medication access solutions engage in dietary modifications by reducing fried/ fatty food consumption, reduction of red meats / increase in fresh vegetables / reduction of sodium in diet   Follow Up Plan: Telephone follow up appointment with care management team member scheduled for: 6 months The patient has been provided with contact information for the care  management team and has been advised to call with any health related questions or concerns.          Medication Assistance: None required.  Patient affirms current coverage meets needs.  Compliance/Adherence/Medication fill history: Care Gaps: Shingles and COVID booster   [id number] [id number] [phone] [facility]  [place], [place] [address] Phone: [phone] Fax: [phone]   Uses pill box? Yes Pt endorses 100% compliance  Care Plan and Follow Up Patient Decision:  Patient agrees to Care Plan and Follow-up.  Plan: Telephone follow up appointment with care management team member scheduled for:  6 months  and The patient has been provided with contact information for the care management team and has been advised to call with any health related questions or concerns.   Tomasa Blase, PharmD Clinical Pharmacist, [place]

## 2021-08-03 DIAGNOSIS — I4821 Permanent atrial fibrillation: Secondary | ICD-10-CM

## 2021-08-03 DIAGNOSIS — I10 Essential (primary) hypertension: Secondary | ICD-10-CM | POA: Diagnosis not present

## 2021-08-03 DIAGNOSIS — E782 Mixed hyperlipidemia: Secondary | ICD-10-CM | POA: Diagnosis not present

## 2021-08-15 ENCOUNTER — Telehealth: Payer: Self-pay

## 2021-08-15 NOTE — Chronic Care Management (AMB) (Signed)
    Chronic Care Management Pharmacy Assistant   Name: [name]  MRN: [id number] DOB: 05-Jun-1928  Reason for Encounter: Disease State General   Recent office visits:  None noted  Recent consult visits:  None noted  Hospital visits:  None in previous 6 months  Medications: Outpatient Encounter Medications as of 08/15/2021  Medication Sig   acetaminophen (TYLENOL) 325 MG tablet Take 650 mg by mouth every 6 (six) hours as needed.   amiodarone (PACERONE) 200 MG tablet Take 1 tablet by mouth on Mon., Tues., Wed., Thurs., and Fri. Do not take on Sat. or Sun.   amLODipine (NORVASC) 10 MG tablet Take 1 tablet (10 mg total) by mouth daily. Please call and schedule visit for Jan 2023   diphenhydrAMINE (SOMINEX) 25 MG tablet Take 25 mg by mouth daily as needed for allergies (bee sting).   EPINEPHrine 0.3 mg/0.3 mL IJ SOAJ injection Inject 0.3 mg into the muscle as needed for anaphylaxis. (Patient not taking: Reported on 07/12/2021)   famotidine (PEPCID) 40 MG tablet 40 mg daily as needed. Bee sting   losartan (COZAAR) 100 MG tablet TAKE 1 TABLET (100 MG TOTAL) BY MOUTH DAILY.   Multiple Vitamin (MULTIVITAMIN WITH MINERALS) TABS tablet Take 1 tablet by mouth 2 (two) times daily.   Multiple Vitamins-Minerals (VISION FORMULA/LUTEIN PO) Take 1 tablet by mouth daily.   nitroGLYCERIN (NITROSTAT) 0.4 MG SL tablet Place 1 tablet (0.4 mg total) under the tongue every 5 (five) minutes as needed for chest pain (MAX 3 TABLETS). (Patient not taking: Reported on 07/12/2021)   polyethylene glycol (MIRALAX / GLYCOLAX) packet Take 17 g by mouth daily as needed (constipation).   pravastatin (PRAVACHOL) 40 MG tablet Take 1 tablet (40 mg total) by mouth daily.   Rivaroxaban (XARELTO) 15 MG TABS tablet Take 1 tablet (15 mg total) by mouth daily.   No facility-administered encounter medications on file as of 08/15/2021.   Have you had any problems recently with your health? Patient stated she hasn't had any new  health problems.  Have you had any problems with your pharmacy? Patient stated no issues with the pharmacy.  What issues or side effects are you having with your medications? Patient denied any side effects that she is aware of.  What would you like me to pass along to Edison Nasuti Potts,CPP for them to help you with?  Patient has concerns or complaints at the moment.  What can we do to take care of you better? Patient says she is doing well and doesn't need anything at the moment.  Star Rating Drugs: losartan (COZAAR) 100 MG tablet last filled 04/07/21 90 DS pravastatin (PRAVACHOL) 40 MG tablet last filled 08/10/21 90 DS  [place] Clinical Pharmacist Assistant [phone]

## 2021-08-23 DIAGNOSIS — H353122 Nonexudative age-related macular degeneration, left eye, intermediate dry stage: Secondary | ICD-10-CM | POA: Diagnosis not present

## 2021-08-23 DIAGNOSIS — H353211 Exudative age-related macular degeneration, right eye, with active choroidal neovascularization: Secondary | ICD-10-CM | POA: Diagnosis not present

## 2021-09-02 ENCOUNTER — Ambulatory Visit (INDEPENDENT_AMBULATORY_CARE_PROVIDER_SITE_OTHER): Payer: PPO

## 2021-09-02 ENCOUNTER — Other Ambulatory Visit: Payer: Self-pay

## 2021-09-02 ENCOUNTER — Ambulatory Visit
Admission: EM | Admit: 2021-09-02 | Discharge: 2021-09-02 | Disposition: A | Discharge status: Home / Self Care | Payer: PPO | Attending: Urgent Care | Admitting: Urgent Care

## 2021-09-02 DIAGNOSIS — M25561 Pain in right knee: Secondary | ICD-10-CM

## 2021-09-02 DIAGNOSIS — S8001XA Contusion of right knee, initial encounter: Secondary | ICD-10-CM

## 2021-09-02 DIAGNOSIS — W19XXXA Unspecified fall, initial encounter: Secondary | ICD-10-CM
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
## 2021-09-02 NOTE — ED Triage Notes (Signed)
Pt states caught herself falling yesterday and landed on rt knee. Pt states pain during walking only.

## 2021-09-02 NOTE — Discharge Instructions (Signed)
Do not use any nonsteroidal anti-inflammatories (NSAIDs) like ibuprofen, Motrin, naproxen, Aleve, etc. which are all available over-the-counter.  Please just use Tylenol at a dose of 500mg -650mg  once every 6 hours as needed for your knee pain.

## 2021-09-07 ENCOUNTER — Other Ambulatory Visit: Payer: Self-pay

## 2021-09-07 ENCOUNTER — Ambulatory Visit (INDEPENDENT_AMBULATORY_CARE_PROVIDER_SITE_OTHER): Payer: PPO | Admitting: Podiatry

## 2021-09-07 ENCOUNTER — Encounter: Payer: Self-pay | Admitting: Podiatry

## 2021-09-07 DIAGNOSIS — M79674 Pain in right toe(s): Secondary | ICD-10-CM

## 2021-09-07 DIAGNOSIS — M79675 Pain in left toe(s): Secondary | ICD-10-CM

## 2021-09-07 DIAGNOSIS — B351 Tinea unguium: Secondary | ICD-10-CM

## 2021-09-07 MED ORDER — CICLOPIROX 8 % EX SOLN: Freq: Every day | CUTANEOUS | 0 refills | Status: AC

## 2021-09-07 NOTE — Progress Notes (Signed)
  Subjective:  Patient ID: [name], female    DOB: December 21, 1927,   MRN: [id number]  Chief Complaint  Patient presents with   Nail Problem    Bilateral great toe nail fungus. Thick nails with discoloration and difficulty to cut. RFC     87 y.o. female presents for concerns of bilateral great toenail fungus that has been present for a couple years. Patient has been treating with OTC topical fungal cream. States occasionally the nails will be painful and hoping for a trim today.  . Denies any other pedal complaints. Denies n/v/f/c.   Past Medical History:  Diagnosis Date   Atrial fibrillation ([place])    Cervical arthritis    COLONIC POLYPS, HX OF 09/24/2007   H/O: hysterectomy    fibroid tumors   Hip arthritis    History of colonic polyps    History of stroke    HTN (hypertension)    Hyperlipidemia    Stroke ([place]) 02/2010   SUPRAVENTRICULAR ARRHYTHMIA 12/20/2010   s/p RFCA 05/2010-Dr. Lovena Le    Objective:  Physical Exam: Vascular: DP/PT pulses 2/4 bilateral. CFT <3 seconds. Normal hair growth on digits. No edema.  Skin. No lacerations or abrasions bilateral feet. Nails 1-5 b/l discolred and thickened and elongated with subungual debris.  Musculoskeletal: MMT 5/5 bilateral lower extremities in DF, PF, Inversion and Eversion. Deceased ROM in DF of ankle joint.  Neurological: Sensation intact to light touch.   Assessment:   1. Pain due to onychomycosis of toenails of both feet      Plan:  Patient was evaluated and treated and all questions answered. -Examined patient -Discussed treatment options for painful dystrophic nails  -Discussed fungal nail treatment options including oral, topical, and laser treatments.  -Penlac prescribed but may continue OTC treatment.  -Nails trimmed as a courtesy today.  -Patient to return as needed for follow up evaluation and discussion of fungal culture results or sooner if symptoms worsen.   Lorenda Peck, DPM

## 2021-09-20 ENCOUNTER — Telehealth: Payer: Self-pay | Admitting: Internal Medicine

## 2021-09-20 MED ORDER — RIVAROXABAN 15 MG PO TABS: 15.0000 mg | ORAL_TABLET | Freq: Every day | ORAL | 0 refills | Status: DC

## 2021-09-20 NOTE — Telephone Encounter (Signed)
Refill has been sent to the patient's pharmacy.  

## 2021-09-20 NOTE — Telephone Encounter (Signed)
1.Medication Requested: Rivaroxaban (XARELTO) 15 MG TABS tablet   2. Pharmacy (Name, [place], [place]): [facility] - [place], [place]  Phone:  [phone] Fax:  [phone]   3. On Med List: yes  4. Last Visit with PCP: 01.27.22  5. Next visit date with PCP: n/a  **Requesting 90DS**  Advised patient daughter OV was needed for refill since patient has not been seen since 12/2020... daughter declined saying that patient only comes once a year   Agent: Please be advised that RX refills may take up to 3 business days. We ask that you follow-up with your pharmacy.

## 2021-09-21 MED ORDER — RIVAROXABAN 15 MG PO TABS: 15.0000 mg | ORAL_TABLET | Freq: Every day | ORAL | 0 refills | Status: DC

## 2021-09-21 NOTE — Addendum Note (Signed)
Addended by: Thomes Cake on: 09/21/2021 04:26 PM   Modules accepted: Orders

## 2021-09-21 NOTE — Telephone Encounter (Signed)
Refill sent to mail order pharmacy.

## 2021-09-21 NOTE — Telephone Encounter (Signed)
Daughter called on behalf of pt. Stated the pharmacy is only going to fill 30-day supply. The cost for the 30-day is the same as a 90-day supply through the mail-order. If they are unable to do the 90 day supply at [facility] she is requesting a new order for the [facility] instead and a short supply to hold her over until she receives the mail order.   Please advise.

## 2021-09-23 ENCOUNTER — Telehealth: Payer: Self-pay | Admitting: Internal Medicine

## 2021-09-23 MED ORDER — LOSARTAN POTASSIUM 100 MG PO TABS
ORAL_TABLET | ORAL | 0 refills | Status: DC
Start: 2021-09-23 — End: 2021-12-20

## 2021-09-23 NOTE — Telephone Encounter (Signed)
Refill has been sent to her local pharmacy

## 2021-09-23 NOTE — Telephone Encounter (Signed)
1.Medication Requested: losartan (COZAAR) 100 MG tablet  2. Pharmacy (Name, [place], [facility]):  [facility], [place] Phone:  [phone]  Fax:  [phone]      4. Last Visit with PCP: 9.30.22  5. Next visit date with PCP: 1.30.22   Pt. Daughter states that pt. Has two days worth of medication left. Would like refill sent ASAP.   Callback #- [phone]  Agent: Please be advised that RX refills may take up to 3 business days. We ask that you follow-up with your pharmacy.

## 2021-10-07 ENCOUNTER — Encounter (HOSPITAL_COMMUNITY): Payer: Self-pay | Admitting: Emergency Medicine

## 2021-10-07 ENCOUNTER — Other Ambulatory Visit: Payer: Self-pay

## 2021-10-07 ENCOUNTER — Emergency Department (HOSPITAL_COMMUNITY)
Admission: EM | Admit: 2021-10-07 | Discharge: 2021-10-08 | Disposition: A | Discharge status: Home / Self Care | Payer: PPO | Attending: Emergency Medicine | Admitting: Emergency Medicine

## 2021-10-07 ENCOUNTER — Emergency Department (HOSPITAL_COMMUNITY): Payer: PPO

## 2021-10-07 ENCOUNTER — Ambulatory Visit
Admission: EM | Admit: 2021-10-07 | Discharge: 2021-10-07 | Disposition: A | Discharge status: Home / Self Care | Payer: PPO

## 2021-10-07 DIAGNOSIS — R531 Weakness: Secondary | ICD-10-CM

## 2021-10-07 DIAGNOSIS — R519 Headache, unspecified: Secondary | ICD-10-CM | POA: Diagnosis not present

## 2021-10-07 DIAGNOSIS — E871 Hypo-osmolality and hyponatremia: Secondary | ICD-10-CM | POA: Insufficient documentation

## 2021-10-07 DIAGNOSIS — R29818 Other symptoms and signs involving the nervous system: Secondary | ICD-10-CM | POA: Diagnosis not present

## 2021-10-07 DIAGNOSIS — I10 Essential (primary) hypertension: Secondary | ICD-10-CM | POA: Insufficient documentation

## 2021-10-07 DIAGNOSIS — I4891 Unspecified atrial fibrillation: Secondary | ICD-10-CM | POA: Diagnosis not present

## 2021-10-07 DIAGNOSIS — R29898 Other symptoms and signs involving the musculoskeletal system: Secondary | ICD-10-CM

## 2021-10-07 DIAGNOSIS — G319 Degenerative disease of nervous system, unspecified: Secondary | ICD-10-CM | POA: Diagnosis not present

## 2021-10-07 DIAGNOSIS — R5383 Other fatigue: Secondary | ICD-10-CM | POA: Diagnosis present

## 2021-10-07 DIAGNOSIS — Z79899 Other long term (current) drug therapy: Secondary | ICD-10-CM | POA: Insufficient documentation

## 2021-10-07 DIAGNOSIS — N390 Urinary tract infection, site not specified: Secondary | ICD-10-CM | POA: Diagnosis not present

## 2021-10-07 DIAGNOSIS — Z7901 Long term (current) use of anticoagulants: Secondary | ICD-10-CM | POA: Insufficient documentation

## 2021-10-07 DIAGNOSIS — Z20822 Contact with and (suspected) exposure to covid-19: Secondary | ICD-10-CM | POA: Diagnosis not present

## 2021-10-07 DIAGNOSIS — I63312 Cerebral infarction due to thrombosis of left middle cerebral artery: Secondary | ICD-10-CM | POA: Diagnosis not present

## 2021-10-07 DIAGNOSIS — I63412 Cerebral infarction due to embolism of left middle cerebral artery: Secondary | ICD-10-CM | POA: Diagnosis not present

## 2021-10-07 DIAGNOSIS — Z96641 Presence of right artificial hip joint: Secondary | ICD-10-CM | POA: Diagnosis not present

## 2021-10-07 LAB — CBC WITH DIFFERENTIAL/PLATELET
Abs Immature Granulocytes: 0.03 10*3/uL (ref 0.00–0.07)
Basophils Absolute: 0.1 10*3/uL (ref 0.0–0.1)
Basophils Relative: 1 %
Eosinophils Absolute: 0.1 10*3/uL (ref 0.0–0.5)
Eosinophils Relative: 1 %
HCT: 37.2 % (ref 36.0–46.0)
Hemoglobin: 12.8 g/dL (ref 12.0–15.0)
Immature Granulocytes: 0 %
Lymphocytes Relative: 22 %
Lymphs Abs: 1.6 10*3/uL (ref 0.7–4.0)
MCH: 29.3 pg (ref 26.0–34.0)
MCHC: 34.4 g/dL (ref 30.0–36.0)
MCV: 85.1 fL (ref 80.0–100.0)
Monocytes Absolute: 0.7 10*3/uL (ref 0.1–1.0)
Monocytes Relative: 10 %
Neutro Abs: 4.8 10*3/uL (ref 1.7–7.7)
Neutrophils Relative %: 66 %
Platelets: UNDETERMINED 10*3/uL (ref 150–400)
RBC: 4.37 MIL/uL (ref 3.87–5.11)
RDW: 14.3 % (ref 11.5–15.5)
WBC: 7.2 10*3/uL (ref 4.0–10.5)
nRBC: 0 % (ref 0.0–0.2)

## 2021-10-07 LAB — URINALYSIS, ROUTINE W REFLEX MICROSCOPIC
Bilirubin Urine: NEGATIVE
Glucose, UA: NEGATIVE mg/dL
Hgb urine dipstick: NEGATIVE
Ketones, ur: 5 mg/dL — AB
Nitrite: NEGATIVE
Protein, ur: NEGATIVE mg/dL
Specific Gravity, Urine: 1.016 (ref 1.005–1.030)
WBC, UA: >50 WBC/hpf — ABNORMAL HIGH (ref 0–5)
pH: 5 (ref 5.0–8.0)

## 2021-10-07 LAB — COMPREHENSIVE METABOLIC PANEL
ALT: 39 U/L (ref 0–44)
AST: 33 U/L (ref 15–41)
Albumin: 3.7 g/dL (ref 3.5–5.0)
Alkaline Phosphatase: 43 U/L (ref 38–126)
Anion gap: 9 (ref 5–15)
BUN: 14 mg/dL (ref 8–23)
CO2: 22 mmol/L (ref 22–32)
Calcium: 9.4 mg/dL (ref 8.9–10.3)
Chloride: 99 mmol/L (ref 98–111)
Creatinine, Ser: 1.33 mg/dL — ABNORMAL HIGH (ref 0.44–1.00)
GFR, Estimated: 38 mL/min — ABNORMAL LOW (ref >60)
Glucose, Bld: 103 mg/dL — ABNORMAL HIGH (ref 70–99)
Potassium: 4 mmol/L (ref 3.5–5.1)
Sodium: 130 mmol/L — ABNORMAL LOW (ref 135–145)
Total Bilirubin: 0.6 mg/dL (ref 0.3–1.2)
Total Protein: 6.5 g/dL (ref 6.5–8.1)

## 2021-10-07 LAB — RESP PANEL BY RT-PCR (FLU A&B, COVID) ARPGX2
Influenza A by PCR: NEGATIVE
Influenza B by PCR: NEGATIVE
SARS Coronavirus 2 by RT PCR: NEGATIVE

## 2021-10-07 NOTE — ED Provider Notes (Signed)
Initially thought that patient was being evaluated for UTI given complaints noted in front desk note, however, after discussing with daughter determined that she is actually dragging her right leg, was incontinent this morning which is not common for her, and daughter is concerned for possible "mini stroke". She seems to be more incoherent as well. She has history of stroke and given symptoms recommended further evaluation in the ED. Daughter feels safe with transport and given duration of symptoms I feel transport by POV is appropriate.    Francene Finders, PA-C 10/07/21 1457

## 2021-10-07 NOTE — ED Provider Notes (Signed)
Emergency Medicine Provider Triage Evaluation Note  [name] , a 87 y.o. female  was evaluated in triage.  Pt complains of confusion, weakness, worsened right leg weakness.  History provided mostly by daughter.  For the past few days, patient has been slightly more confused and spacey than normal.  She has had worsening weakness of her right leg, which has some baseline weakness due to previous stroke.  No fevers.  No cough.  Review of Systems  Positive: weakness, confusion Negative: fever  Physical Exam  BP 131/64 (BP Location: Left Arm)   Pulse 66   Temp 98.2 F (36.8 C) (Oral)   Resp 16   SpO2 100%  Gen:   Awake, no distress   Resp:  Normal effort  MSK:   Moves extremities without difficulty  Other:  Strength and sensation intact x4.  CN intact.  Medical Decision Making  Medically screening exam initiated at 5:21 PM.  Appropriate orders placed.  ANTONIO WOODHAMS was informed that the remainder of the evaluation will be completed by another provider, this initial triage assessment does not replace that evaluation, and the importance of remaining in the ED until their evaluation is complete.  Labs, ua, cxr, ekg, resp panel, ct   , , PA-C 10/07/21 1722    Jeanell Sparrow, DO 10/07/21 2149

## 2021-10-07 NOTE — ED Triage Notes (Signed)
Pt c/o intermittent drowsiness, right leg dragging

## 2021-10-07 NOTE — ED Triage Notes (Addendum)
Pt arrived POV with daughter, sent from UC for c/o increased lethargy for several days and dragging R leg, hx of previous CVA, R sided facial droop noted, per daughter at bedside this is baseline for patient

## 2021-10-08 MED ORDER — SODIUM CHLORIDE 0.9 % IV SOLN
1.0000 g | Freq: Once | INTRAVENOUS | Status: AC
  Administered 2021-10-08: 1 g via INTRAVENOUS
  Filled 2021-10-08: qty 10

## 2021-10-08 MED ORDER — SODIUM CHLORIDE 0.9 % IV BOLUS
500.0000 mL | Freq: Once | INTRAVENOUS | Status: AC
  Administered 2021-10-08: 500 mL via INTRAVENOUS

## 2021-10-08 MED ORDER — CEPHALEXIN 500 MG PO CAPS: 500.0000 mg | ORAL_CAPSULE | Freq: Two times a day (BID) | ORAL | 0 refills | Status: DC

## 2021-10-08 MED ORDER — ONDANSETRON 4 MG PO TBDP: 4.0000 mg | ORAL_TABLET | Freq: Three times a day (TID) | ORAL | 0 refills | Status: DC | PRN

## 2021-10-08 NOTE — ED Provider Notes (Signed)
[facility] EMERGENCY DEPARTMENT Provider Note   CSN: [id number] Arrival date & time: 10/07/21  1542     History Chief Complaint  Patient presents with   Weakness    [name] is a 87 y.o. female.  The history is provided by the patient, a relative and medical records.  Weakness [name] is a 87 y.o. female who presents to the Emergency Department complaining of weakness.  She presents to the ED accompanied by her daughter for evaluation of weakness.  This morning she was dragging her right foot more than usual.  Has a hx/o prior CVA 10 years ago and gets recurrent weakness at times of illness.  She has increased fatigue/sleepiness for the last several days with increased weakness.  Today she had bowel incontinence.  Complains of associated HA.    No fever, cough, N/V, cough, abdominal pain, dysuria, urinary incontinence.      Past Medical History:  Diagnosis Date   Atrial fibrillation ([place])    Cervical arthritis    COLONIC POLYPS, HX OF 09/24/2007   H/O: hysterectomy    fibroid tumors   Hip arthritis    History of colonic polyps    History of stroke    HTN (hypertension)    Hyperlipidemia    Stroke ([place]) 02/2010   SUPRAVENTRICULAR ARRHYTHMIA 12/20/2010   s/p RFCA 05/2010-Dr. Lovena Le    Patient Active Problem List   Diagnosis Date Noted   Bee allergy status 04/18/2019   Routine health maintenance 12/01/2011   Hx of completed stroke 03/09/2011   Atrial fibrillation ([place]) 03/09/2011   Hyperlipidemia 10/09/2008   Essential hypertension 09/24/2007    Past Surgical History:  Procedure Laterality Date   ABDOMINAL HYSTERECTOMY     BREAST LUMPECTOMY     left   FB removal from popliteal fossae     HIP ARTHROPLASTY Right 08/31/2018   Procedure: ARTHROPLASTY BIPOLAR HIP (HEMIARTHROPLASTY);  Surgeon: Renette Butters, MD;  Location: [place];  Service: Orthopedics;  Laterality: Right;   INTRACAPSULAR CATARACT EXTRACTION      Left eye Oct 18, 2009  right eye Jan '11 By Dr Era Skeen   PILONIDAL CYST EXCISION     TRANSTHORACIC ECHOCARDIOGRAM  2007     OB History   No obstetric history on file.     Family History  Problem Relation Age of Onset   Heart attack Mother    Stroke Mother    Coronary artery disease Mother    Heart disease Mother    Cancer Father        Colon Cancer   Diabetes Daughter    Cancer Daughter        Leukemia    Social History   Tobacco Use   Smoking status: Never   Smokeless tobacco: Never  Vaping Use   Vaping Use: Never used  Substance Use Topics   Alcohol use: No    Alcohol/week: 0.0 standard drinks    Comment: rare   Drug use: No    Home Medications Prior to Admission medications   Medication Sig Start Date End Date Taking? Authorizing Provider  cephALEXin (KEFLEX) 500 MG capsule Take 1 capsule (500 mg total) by mouth 2 (two) times daily. 10/08/21  Yes Quintella Reichert, MD  ondansetron (ZOFRAN ODT) 4 MG disintegrating tablet Take 1 tablet (4 mg total) by mouth every 8 (eight) hours as needed for nausea or vomiting. 10/08/21  Yes Quintella Reichert, MD  acetaminophen (TYLENOL) 325 MG tablet Take  650 mg by mouth every 6 (six) hours as needed.    [provider]  amiodarone (PACERONE) 200 MG tablet Take 1 tablet by mouth on Mon., Tues., Wed., Thurs., and Fri. Do not take on Sat. or Sun. 02/04/21   Evans Lance, MD  amLODipine (NORVASC) 10 MG tablet Take 1 tablet (10 mg total) by mouth daily. Please call and schedule visit for Jan 2023 05/10/21   Hoyt Koch, MD  ciclopirox [facility]) 8 % solution Apply topically at bedtime. Apply over nail and surrounding skin. Apply daily over previous coat. After seven (7) days, may remove with alcohol and continue cycle. 09/07/21   Lorenda Peck, MD  diphenhydrAMINE (SOMINEX) 25 MG tablet Take 25 mg by mouth daily as needed for allergies (bee sting).    [provider]  EPINEPHrine 0.3 mg/0.3 mL IJ SOAJ injection Inject 0.3 mg into  the muscle as needed for anaphylaxis. Patient not taking: Reported on 07/12/2021 05/31/21   Hoyt Koch, MD  famotidine (PEPCID) 40 MG tablet 40 mg daily as needed. Bee sting    [provider]  losartan (COZAAR) 100 MG tablet TAKE 1 TABLET (100 MG TOTAL) BY MOUTH DAILY. 09/23/21   Hoyt Koch, MD  Multiple Vitamin (MULTIVITAMIN WITH MINERALS) TABS tablet Take 1 tablet by mouth 2 (two) times daily.    [provider]  Multiple Vitamins-Minerals (VISION FORMULA/LUTEIN PO) Take 1 tablet by mouth daily.    [provider]  nitroGLYCERIN (NITROSTAT) 0.4 MG SL tablet Place 1 tablet (0.4 mg total) under the tongue every 5 (five) minutes as needed for chest pain (MAX 3 TABLETS). Patient not taking: Reported on 07/12/2021 03/30/17   Baldwin Jamaica, PA-C  polyethylene glycol [facility] Floria Raveling) packet Take 17 g by mouth daily as needed (constipation).    [provider]  pravastatin (PRAVACHOL) 40 MG tablet Take 1 tablet (40 mg total) by mouth daily. 05/31/21   Hoyt Koch, MD  Rivaroxaban (XARELTO) 15 MG TABS tablet Take 1 tablet (15 mg total) by mouth daily. 09/21/21   Hoyt Koch, MD    Allergies    Codeine  Review of Systems   Review of Systems  Neurological:  Positive for weakness.  All other systems reviewed and are negative.  Physical Exam Updated Vital Signs BP (!) 145/61   Pulse 66   Temp 98.4 F (36.9 C) (Oral)   Resp 17   SpO2 95%   Physical Exam Vitals and nursing note reviewed.  Constitutional:      Appearance: She is well-developed.  HENT:     Head: Normocephalic and atraumatic.  Cardiovascular:     Rate and Rhythm: Normal rate and regular rhythm.     Heart sounds: No murmur heard. Pulmonary:     Effort: Pulmonary effort is normal. No respiratory distress.     Breath sounds: Normal breath sounds.  Abdominal:     Palpations: Abdomen is soft.     Tenderness: There is no abdominal tenderness. There  is no guarding or rebound.  Musculoskeletal:        General: No tenderness.  Skin:    General: Skin is warm and dry.  Neurological:     Mental Status: She is alert.     Comments: 5/5 strength in BUE, 4+/5 strength in the RLE.  5/5 strength in the LLE.  Oriented to person and place.  Psychiatric:        Behavior: Behavior normal.    ED  Results / Procedures / Treatments   Labs (all labs ordered are listed, but only abnormal results are displayed) Labs Reviewed  COMPREHENSIVE METABOLIC PANEL - Abnormal; Notable for the following components:      Result Value   Sodium 130 (*)    Glucose, Bld 103 (*)    Creatinine, Ser 1.33 (*)    GFR, Estimated 38 (*)    All other components within normal limits  URINALYSIS, ROUTINE W REFLEX MICROSCOPIC - Abnormal; Notable for the following components:   Color, Urine AMBER (*)    APPearance CLOUDY (*)    Ketones, ur 5 (*)    Leukocytes,Ua MODERATE (*)    WBC, UA >50 (*)    Bacteria, UA MANY (*)    All other components within normal limits  RESP PANEL BY RT-PCR (FLU A&B, COVID) ARPGX2  URINE CULTURE  CBC WITH DIFFERENTIAL/PLATELET    EKG EKG Interpretation  Date/Time:  Friday October 07 2021 16:59:59 EDT Ventricular Rate:  65 PR Interval:  226 QRS Duration: 88 QT Interval:  450 QTC Calculation: 468 R Axis:   -61 Text Interpretation: Sinus rhythm with 1st degree A-V block Left axis deviation Pulmonary disease pattern Abnormal ECG Confirmed by Quintella Reichert [phone]) on 10/08/2021 2:34:44 AM  Radiology DG Chest 2 View  Result Date: 10/07/2021 CLINICAL DATA:  Weakness. EXAM: CHEST - 2 VIEW COMPARISON:  Chest x-ray 08/30/2018. FINDINGS: The heart size and mediastinal contours are within normal limits. Both lungs are clear. The visualized skeletal structures are unremarkable. IMPRESSION: No active cardiopulmonary disease. Electronically Signed   By: Ronney Asters M.D.   On: 10/07/2021 18:39   CT Head Wo Contrast  Result Date:  10/07/2021 CLINICAL DATA:  Neurological deficit. EXAM: CT HEAD WITHOUT CONTRAST TECHNIQUE: Contiguous axial images were obtained from the base of the skull through the vertex without intravenous contrast. COMPARISON:  August 30, 2018 FINDINGS: Brain: There is mild to moderate severity cerebral atrophy with widening of the extra-axial spaces and ventricular dilatation. There are areas of decreased attenuation within the white matter tracts of the supratentorial brain, consistent with microvascular disease changes. A chronic left MCA distribution infarct is seen with a small left basal ganglia lacunar infarct also noted. Vascular: No hyperdense vessel or unexpected calcification. Skull: Normal. Negative for fracture or focal lesion. Sinuses/Orbits: There is mild to moderate severity sphenoid sinus mucosal thickening. Other: None. IMPRESSION: 1. Generalized cerebral atrophy. 2. Chronic left MCA distribution infarct. 3. No acute intracranial process. Electronically Signed   By: Virgina Norfolk M.D.   On: 10/07/2021 18:21    Procedures Procedures   Medications Ordered in ED Medications  cefTRIAXone (ROCEPHIN) 1 g in sodium chloride 0.9 % 100 mL IVPB (1 g Intravenous New Bag/Given 10/08/21 0304)  sodium chloride 0.9 % bolus 500 mL (500 mLs Intravenous New Bag/Given 10/08/21 0303)    ED Course  I have reviewed the triage vital signs and the nursing notes.  Pertinent labs & imaging results that were available during my care of the patient were reviewed by me and considered in my medical decision making (see chart for details).    MDM Rules/Calculators/A&P                           patient here accompanied by her daughter for evaluation of progressive fatigue and weakness. She is non-toxic appearing on evaluation. She does have right lower extremity weakness on examination consistent with her prior CVA. UA is consistent with  UTI. She does have mild hyponatremia on today's labs, suspect degree of  dehydration. Plan to treat with antibiotics, IV fluid bolus. Plan to discharge home on oral antibiotics with outpatient follow-up and return precautions. Current evaluation is not consistent with sepsis, acute CVA.  Final Clinical Impression(s) / ED Diagnoses Final diagnoses:  Acute UTI  Weakness    Rx / DC Orders ED Discharge Orders          Ordered    cephALEXin (KEFLEX) 500 MG capsule  2 times daily        10/08/21 0250    ondansetron (ZOFRAN ODT) 4 MG disintegrating tablet  Every 8 hours PRN        10/08/21 0250             Quintella Reichert, MD 10/08/21 [phone]

## 2021-10-08 NOTE — ED Notes (Signed)
E-signature pad unavailable at time of pt discharge. This RN discussed discharge materials with pt and answered all pt questions. Pt stated understanding of discharge material. ? ?

## 2021-10-10 LAB — URINE CULTURE: Culture: >=100000 — AB

## 2021-10-17 ENCOUNTER — Ambulatory Visit (INDEPENDENT_AMBULATORY_CARE_PROVIDER_SITE_OTHER): Payer: PPO | Admitting: Internal Medicine

## 2021-10-17 ENCOUNTER — Encounter: Payer: Self-pay | Admitting: Internal Medicine

## 2021-10-17 ENCOUNTER — Other Ambulatory Visit: Payer: Self-pay

## 2021-10-17 VITALS — BP 124/68 | HR 75 | Resp 18 | Ht 61.0 in | Wt 103.0 lb

## 2021-10-17 DIAGNOSIS — Z23 Encounter for immunization: Secondary | ICD-10-CM

## 2021-10-17 DIAGNOSIS — G319 Degenerative disease of nervous system, unspecified: Secondary | ICD-10-CM | POA: Diagnosis not present

## 2021-10-17 DIAGNOSIS — K5900 Constipation, unspecified: Secondary | ICD-10-CM | POA: Diagnosis not present

## 2021-10-17 DIAGNOSIS — N3000 Acute cystitis without hematuria: Secondary | ICD-10-CM

## 2021-10-17 LAB — POCT URINALYSIS DIPSTICK
Bilirubin, UA: NEGATIVE
Glucose, UA: NEGATIVE
Ketones, UA: NEGATIVE
Nitrite, UA: NEGATIVE
Protein, UA: POSITIVE — AB
Spec Grav, UA: 1.025 (ref 1.010–1.025)
Urobilinogen, UA: 0.2 E.U./dL
pH, UA: 5.5 (ref 5.0–8.0)

## 2021-10-17 MED ORDER — SULFAMETHOXAZOLE-TRIMETHOPRIM 800-160 MG PO TABS: 1.0000 | ORAL_TABLET | Freq: Two times a day (BID) | ORAL | 0 refills | Status: DC

## 2021-10-17 NOTE — Progress Notes (Signed)
   Subjective:   Patient ID: [name], female    DOB: 01-06-1928, 87 y.o.   MRN: [id number]  HPI The patient is a 87 YO female coming in for ER follow up. In for UTI and confusion and overall is improving but still some confusion and they are on last does of medication.   Review of Systems  Constitutional:  Positive for activity change and appetite change.  HENT: Negative.    Eyes: Negative.   Respiratory:  Negative for cough, chest tightness and shortness of breath.   Cardiovascular:  Negative for chest pain, palpitations and leg swelling.  Gastrointestinal:  Negative for abdominal distention, abdominal pain, constipation, diarrhea, nausea and vomiting.  Genitourinary:  Positive for frequency.  Musculoskeletal: Negative.   Skin: Negative.   Neurological: Negative.   Psychiatric/Behavioral:  Positive for confusion.    Objective:  Physical Exam Constitutional:      Appearance: She is well-developed.  HENT:     Head: Normocephalic and atraumatic.  Cardiovascular:     Rate and Rhythm: Normal rate and regular rhythm.  Pulmonary:     Effort: Pulmonary effort is normal. No respiratory distress.     Breath sounds: Normal breath sounds. No wheezing or rales.  Abdominal:     General: Bowel sounds are normal. There is no distension.     Palpations: Abdomen is soft.     Tenderness: There is no abdominal tenderness. There is no rebound.  Musculoskeletal:     Cervical back: Normal range of motion.  Skin:    General: Skin is warm and dry.  Neurological:     Mental Status: She is alert.     Coordination: Coordination normal.     Comments: Slow gait, some confusion on events and timeline on history.     Vitals:   10/17/21 1446  BP: 124/68  Pulse: 75  Resp: 18  SpO2: 98%  Weight: 103 lb (46.7 kg)  Height: 5\' 1"  (1.549 m)    This visit occurred during the SARS-CoV-2 public health emergency.  Safety protocols were in place, including screening questions prior to the visit,  additional usage of staff PPE, and extensive cleaning of exam room while observing appropriate contact time as indicated for disinfecting solutions.   Assessment & Plan:  Flu shot given at visit  Visit time 25 minutes in face to face communication with patient and coordination of care, additional 10 minutes spent in record review, coordination or care, ordering tests, communicating/referring to other healthcare professionals, documenting in medical records all on the same day of the visit for total time 35 minutes spent on the visit.

## 2021-10-17 NOTE — Patient Instructions (Addendum)
We will recheck the urine today and there are still signs of infection so we have sent in 1 weeks worth of bactrim. Take 1 pill twice a day for 1 week.

## 2021-10-18 ENCOUNTER — Encounter: Payer: Self-pay | Admitting: Internal Medicine

## 2021-10-18 DIAGNOSIS — K5900 Constipation, unspecified: Secondary | ICD-10-CM | POA: Insufficient documentation

## 2021-10-18 DIAGNOSIS — N3000 Acute cystitis without hematuria: Secondary | ICD-10-CM | POA: Insufficient documentation

## 2021-10-18 DIAGNOSIS — G319 Degenerative disease of nervous system, unspecified: Secondary | ICD-10-CM | POA: Insufficient documentation

## 2021-10-18 DIAGNOSIS — H35033 Hypertensive retinopathy, bilateral: Secondary | ICD-10-CM | POA: Diagnosis not present

## 2021-10-18 DIAGNOSIS — H353122 Nonexudative age-related macular degeneration, left eye, intermediate dry stage: Secondary | ICD-10-CM | POA: Diagnosis not present

## 2021-10-18 DIAGNOSIS — H353211 Exudative age-related macular degeneration, right eye, with active choroidal neovascularization: Secondary | ICD-10-CM | POA: Diagnosis not present

## 2021-10-18 NOTE — Assessment & Plan Note (Signed)
Discussed that there are changed on recent head CT of past stroke and some changes with decrease in brain volume due to aging or stroke or combination. Suspect this makes her more likely to get confused with infection.

## 2021-10-18 NOTE — Assessment & Plan Note (Signed)
She is having some constipation since starting the antibiotic and they have miralax at home. Advised that this is safe and effective and given instructions for use.

## 2021-10-18 NOTE — Assessment & Plan Note (Signed)
She has had partial improvement but not resolution of symptoms. POC U/A done with continued signs of infection. Urine culture reviewed from ER and keflex should have been sensitive. Ordered another urine culture on sample today and ordered bactrim 1 week given ongoing symptoms and signs of ongoing infection.

## 2021-10-20 ENCOUNTER — Encounter: Payer: Self-pay | Admitting: Internal Medicine

## 2021-10-20 LAB — URINE CULTURE

## 2021-10-21 ENCOUNTER — Encounter: Payer: Self-pay | Admitting: Internal Medicine

## 2021-10-21 NOTE — Telephone Encounter (Signed)
Patient daughter calling in  Wanted to make sure provider received mychart message.Marland Kitchen advised her mychart message was received & provider will review when available

## 2021-11-03 ENCOUNTER — Other Ambulatory Visit: Payer: Self-pay

## 2021-11-03 MED ORDER — AMIODARONE HCL 200 MG PO TABS: ORAL_TABLET | ORAL | 0 refills | Status: DC

## 2021-11-25 ENCOUNTER — Encounter: Payer: Self-pay | Admitting: Internal Medicine

## 2021-11-25 ENCOUNTER — Other Ambulatory Visit: Payer: Self-pay | Admitting: Internal Medicine

## 2021-11-25 DIAGNOSIS — U071 COVID-19: Secondary | ICD-10-CM | POA: Insufficient documentation

## 2021-11-25 MED ORDER — NIRMATRELVIR/RITONAVIR (PAXLOVID) TABLET (RENAL DOSING): 2.0000 | ORAL_TABLET | Freq: Two times a day (BID) | ORAL | 0 refills | Status: AC

## 2021-11-29 ENCOUNTER — Telehealth: Payer: Self-pay | Admitting: Internal Medicine

## 2021-11-29 NOTE — Telephone Encounter (Signed)
Connected to [facility] 12.23.2022.   -caller states pt vomited this morning. pt had a zofran from a few weeks ago when she had a UTI. pt was tested for covid and is +. had a fever earlier at 100.4. pt is vaccianted and boosted. pt feels better. no emesis since AM, had tylenol.   Advised to call PCP within 24 hours.

## 2021-12-12 ENCOUNTER — Telehealth: Payer: Self-pay | Admitting: Internal Medicine

## 2021-12-12 NOTE — Telephone Encounter (Signed)
1.Medication Requested: Rivaroxaban (XARELTO) 15 MG TABS tablet  2. Pharmacy (Name, [place], [place]): [facility] - [place], [place]  Phone:  [phone] Fax:  [phone]   3. On Med List: yes  4. Last Visit with PCP: 11.14.22  5. Next visit date with PCP: 01.30.23   Agent: Please be advised that RX refills may take up to 3 business days. We ask that you follow-up with your pharmacy.

## 2021-12-14 MED ORDER — RIVAROXABAN 15 MG PO TABS: 15.0000 mg | ORAL_TABLET | Freq: Every day | ORAL | 0 refills | Status: DC

## 2021-12-14 NOTE — Telephone Encounter (Signed)
Refill has been sent to pt's mail order pharmacy.  °

## 2021-12-19 ENCOUNTER — Other Ambulatory Visit: Payer: Self-pay | Admitting: Internal Medicine

## 2021-12-20 ENCOUNTER — Other Ambulatory Visit: Payer: Self-pay | Admitting: Internal Medicine

## 2021-12-26 DIAGNOSIS — H353211 Exudative age-related macular degeneration, right eye, with active choroidal neovascularization: Secondary | ICD-10-CM | POA: Diagnosis not present

## 2021-12-26 DIAGNOSIS — H353122 Nonexudative age-related macular degeneration, left eye, intermediate dry stage: Secondary | ICD-10-CM | POA: Diagnosis not present

## 2021-12-28 ENCOUNTER — Other Ambulatory Visit: Payer: Self-pay | Admitting: Internal Medicine

## 2021-12-30 ENCOUNTER — Telehealth: Payer: Self-pay | Admitting: Internal Medicine

## 2021-12-30 NOTE — Telephone Encounter (Signed)
Left message for patient to call back to schedule Medicare Annual Wellness Visit   Last AWV  12/30/21  Please schedule at anytime with [facility] if patient calls the office back.    40 Minutes appointment   Any questions, please call me at [phone]

## 2022-01-02 ENCOUNTER — Other Ambulatory Visit: Payer: Self-pay

## 2022-01-02 ENCOUNTER — Encounter: Payer: Self-pay | Admitting: Internal Medicine

## 2022-01-02 ENCOUNTER — Ambulatory Visit (INDEPENDENT_AMBULATORY_CARE_PROVIDER_SITE_OTHER): Payer: PPO | Admitting: Internal Medicine

## 2022-01-02 VITALS — BP 118/64 | HR 72 | Resp 18 | Ht 61.0 in | Wt 100.4 lb

## 2022-01-02 DIAGNOSIS — I10 Essential (primary) hypertension: Secondary | ICD-10-CM | POA: Diagnosis not present

## 2022-01-02 DIAGNOSIS — E782 Mixed hyperlipidemia: Secondary | ICD-10-CM

## 2022-01-02 DIAGNOSIS — G319 Degenerative disease of nervous system, unspecified: Secondary | ICD-10-CM

## 2022-01-02 DIAGNOSIS — I4821 Permanent atrial fibrillation: Secondary | ICD-10-CM | POA: Diagnosis not present

## 2022-01-02 DIAGNOSIS — Z0000 Encounter for general adult medical examination without abnormal findings: Secondary | ICD-10-CM

## 2022-01-02 DIAGNOSIS — Z8673 Personal history of transient ischemic attack (TIA), and cerebral infarction without residual deficits: Secondary | ICD-10-CM

## 2022-01-02 DIAGNOSIS — Z91030 Bee allergy status: Secondary | ICD-10-CM

## 2022-01-02 LAB — CBC
HCT: 38.3 % (ref 36.0–46.0)
Hemoglobin: 12.7 g/dL (ref 12.0–15.0)
MCHC: 33.1 g/dL (ref 30.0–36.0)
MCV: 86.4 fl (ref 78.0–100.0)
Platelets: 262 10*3/uL (ref 150.0–400.0)
RBC: 4.43 Mil/uL (ref 3.87–5.11)
RDW: 17.5 % — ABNORMAL HIGH (ref 11.5–15.5)
WBC: 6.9 10*3/uL (ref 4.0–10.5)

## 2022-01-02 LAB — LIPID PANEL
Cholesterol: 169 mg/dL (ref 0–200)
HDL: 97 mg/dL (ref >39.00)
LDL Cholesterol: 58 mg/dL (ref 0–99)
NonHDL: 72.37
Total CHOL/HDL Ratio: 2
Triglycerides: 74 mg/dL (ref 0.0–149.0)
VLDL: 14.8 mg/dL (ref 0.0–40.0)

## 2022-01-02 LAB — COMPREHENSIVE METABOLIC PANEL
ALT: 21 U/L (ref 0–35)
AST: 26 U/L (ref 0–37)
Albumin: 4.3 g/dL (ref 3.5–5.2)
Alkaline Phosphatase: 50 U/L (ref 39–117)
BUN: 17 mg/dL (ref 6–23)
CO2: 23 mEq/L (ref 19–32)
Calcium: 10.1 mg/dL (ref 8.4–10.5)
Chloride: 102 mEq/L (ref 96–112)
Creatinine, Ser: 1.26 mg/dL — ABNORMAL HIGH (ref 0.40–1.20)
GFR: 37.28 mL/min — ABNORMAL LOW (ref >60.00)
Glucose, Bld: 101 mg/dL — ABNORMAL HIGH (ref 70–99)
Potassium: 4.4 mEq/L (ref 3.5–5.1)
Sodium: 136 mEq/L (ref 135–145)
Total Bilirubin: 0.4 mg/dL (ref 0.2–1.2)
Total Protein: 7.2 g/dL (ref 6.0–8.3)

## 2022-01-02 LAB — TSH: TSH: 2.18 u[IU]/mL (ref 0.35–5.50)

## 2022-01-02 NOTE — Progress Notes (Signed)
Subjective:   Patient ID: [name], female    DOB: 1928/05/23, 88 y.o.   MRN: [id number]  HPI Here for medicare wellness and physical, no new complaints. Please see A/P for status and treatment of chronic medical problems.   Diet: heart healthy Physical activity: sedentary Depression/mood screen: negative Hearing: moderate to severe loss Visual acuity: grossly normal with lens, performs annual eye exam  ADLs: capable Fall risk: none Home safety: good Cognitive evaluation: intact to orientation, naming, recall and repetition EOL planning: adv directives discussed  [place] Visit from 01/02/2022 in [place] at [facility] Total Score 0        Fall Risk 01/10/2019 12/30/2020 09/02/2021 10/07/2021 01/02/2022  Falls in the past year? 1 1 - - 0  Was there an injury with Fall? 1 0 - - 0  Was there an injury with Fall? - - - - -  Fall Risk Category Calculator 3 1 - - 0  Fall Risk Category High Low - - Low  Patient Fall Risk Level - - Low fall risk Low fall risk -  Patient at Risk for Falls Due to Impaired mobility;Impaired balance/gait - - - -  Fall risk Follow up Falls prevention discussed - - - -    I have personally reviewed and have noted 1. The patient's medical and social history - reviewed today no changes 2. Their use of alcohol, tobacco or illicit drugs 3. Their current medications and supplements 4. The patient's functional ability including ADL's, fall risks, home safety risks and hearing or visual impairment. 5. Diet and physical activities 6. Evidence for depression or mood disorders 7. Care team reviewed and updated 8.  The patient is not on an opioid pain medication.  Patient Care Team: Hoyt Koch, MD as PCP - General (Internal Medicine) Delice Bison Darnelle Maffucci, [facility] as Pharmacist (Pharmacist) Past Medical History:  Diagnosis Date   Atrial fibrillation [facility])    Cervical arthritis    COLONIC POLYPS, HX OF 09/24/2007   H/O:  hysterectomy    fibroid tumors   Hip arthritis    History of colonic polyps    History of stroke    HTN (hypertension)    Hyperlipidemia    Stroke ([place]) 02/2010   SUPRAVENTRICULAR ARRHYTHMIA 12/20/2010   s/p RFCA 05/2010-Dr. Lovena Le   Past Surgical History:  Procedure Laterality Date   ABDOMINAL HYSTERECTOMY     BREAST LUMPECTOMY     left   FB removal from popliteal fossae     HIP ARTHROPLASTY Right 08/31/2018   Procedure: ARTHROPLASTY BIPOLAR HIP (HEMIARTHROPLASTY);  Surgeon: Renette Butters, MD;  Location: [place];  Service: Orthopedics;  Laterality: Right;   INTRACAPSULAR CATARACT EXTRACTION      Left eye Oct 18, 2009  right eye Jan '11 By Dr Era Skeen   PILONIDAL CYST EXCISION     TRANSTHORACIC ECHOCARDIOGRAM  2007   Family History  Problem Relation Age of Onset   Heart attack Mother    Stroke Mother    Coronary artery disease Mother    Heart disease Mother    Cancer Father        Colon Cancer   Diabetes Daughter    Cancer Daughter        Leukemia   Review of Systems  Constitutional: Negative.   HENT: Negative.    Eyes: Negative.   Respiratory:  Negative for cough, chest tightness and shortness of breath.   Cardiovascular:  Negative for chest  pain, palpitations and leg swelling.  Gastrointestinal:  Negative for abdominal distention, abdominal pain, constipation, diarrhea, nausea and vomiting.  Musculoskeletal: Negative.   Skin: Negative.   Neurological: Negative.   Psychiatric/Behavioral: Negative.     Objective:  Physical Exam Constitutional:      Appearance: She is well-developed.  HENT:     Head: Normocephalic and atraumatic.  Cardiovascular:     Rate and Rhythm: Normal rate and regular rhythm.  Pulmonary:     Effort: Pulmonary effort is normal. No respiratory distress.     Breath sounds: Normal breath sounds. No wheezing or rales.  Abdominal:     General: Bowel sounds are normal. There is no distension.     Palpations: Abdomen is soft.      Tenderness: There is no abdominal tenderness. There is no rebound.  Musculoskeletal:     Cervical back: Normal range of motion.  Skin:    General: Skin is warm and dry.  Neurological:     Mental Status: She is alert and oriented to person, place, and time.     Coordination: Coordination normal.    Vitals:   01/02/22 1331  BP: 118/64  Pulse: 72  Resp: 18  SpO2: 98%  Weight: 100 lb 6.4 oz (45.5 kg)  Height: 5\' 1"  (1.549 m)    This visit occurred during the SARS-CoV-2 public health emergency.  Safety protocols were in place, including screening questions prior to the visit, additional usage of staff PPE, and extensive cleaning of exam room while observing appropriate contact time as indicated for disinfecting solutions.   Assessment & Plan:

## 2022-01-02 NOTE — Patient Instructions (Addendum)
Think about asking about the shingles vaccine.  Get the covid-19 booster and let us know if you are interested in getting hearing checked.

## 2022-01-03 NOTE — Assessment & Plan Note (Signed)
BP at goal on amiodarone, amlodipine, losartan and checking CMP. Adjust as needed.

## 2022-01-03 NOTE — Assessment & Plan Note (Signed)
Flu shot up to date. Covid-19 booster encouraged. Pneumonia complete. Shingrix counseled to get at pharmacy. Tetanus due 2026. Colonoscopy aged out. Mammogram aged out, pap smear aged out and dexa aged out further. Counseled about sun safety and mole surveillance. Counseled about the dangers of distracted driving. Given 10 year screening recommendations.

## 2022-01-03 NOTE — Assessment & Plan Note (Signed)
Taking xarelto for anticoagulation. On amiodarone and appears regular today. Checking TSH and CBC and CMP.

## 2022-01-03 NOTE — Assessment & Plan Note (Signed)
Overall stable.   

## 2022-01-03 NOTE — Assessment & Plan Note (Signed)
On xarelto for A fib which was likely cause. No recurrent symptoms.

## 2022-01-03 NOTE — Assessment & Plan Note (Signed)
Checking lipid panel and adjust pravastatin as needed.

## 2022-01-03 NOTE — Assessment & Plan Note (Signed)
Uses epi pen if needed. Can refill when needed.

## 2022-01-16 ENCOUNTER — Telehealth: Payer: PPO

## 2022-01-17 ENCOUNTER — Other Ambulatory Visit: Payer: Self-pay | Admitting: Internal Medicine

## 2022-01-24 ENCOUNTER — Encounter: Payer: Self-pay | Admitting: Internal Medicine

## 2022-02-06 DIAGNOSIS — H353211 Exudative age-related macular degeneration, right eye, with active choroidal neovascularization: Secondary | ICD-10-CM | POA: Diagnosis not present

## 2022-02-06 DIAGNOSIS — H353122 Nonexudative age-related macular degeneration, left eye, intermediate dry stage: Secondary | ICD-10-CM | POA: Diagnosis not present

## 2022-02-20 ENCOUNTER — Telehealth: Payer: Self-pay | Admitting: Internal Medicine

## 2022-02-20 NOTE — Telephone Encounter (Signed)
Pts daughter checking status of refill request ? ?Advised caller request may take up to 3 bd ?

## 2022-02-20 NOTE — Telephone Encounter (Signed)
1.Medication Requested: amLODipine (NORVASC) 10 MG tablet ? ?2. Pharmacy (Name, Street, [facility]): [facility] [place] [place], [place]  ?Phone:  [phone] ?Fax:  [phone] ? ? ?3. On Med List: yes ? ?4. Last Visit with PCP: 01.30.23 ? ?5. Next visit date with PCP: n/a ? ? ?Agent: Please be advised that RX refills may take up to 3 business days. We ask that you follow-up with your pharmacy.  ?

## 2022-02-21 MED ORDER — AMLODIPINE BESYLATE 10 MG PO TABS: 10.0000 mg | ORAL_TABLET | Freq: Every day | ORAL | 1 refills | Status: DC

## 2022-02-21 NOTE — Telephone Encounter (Signed)
Refill has been sent to the pt's pharmacy  

## 2022-02-27 DIAGNOSIS — H1849 Other corneal degeneration: Secondary | ICD-10-CM | POA: Diagnosis not present

## 2022-02-27 DIAGNOSIS — H5203 Hypermetropia, bilateral: Secondary | ICD-10-CM | POA: Diagnosis not present

## 2022-02-27 DIAGNOSIS — H52223 Regular astigmatism, bilateral: Secondary | ICD-10-CM | POA: Diagnosis not present

## 2022-02-27 DIAGNOSIS — H524 Presbyopia: Secondary | ICD-10-CM | POA: Diagnosis not present

## 2022-03-20 DIAGNOSIS — H353211 Exudative age-related macular degeneration, right eye, with active choroidal neovascularization: Secondary | ICD-10-CM | POA: Diagnosis not present

## 2022-03-20 DIAGNOSIS — H353122 Nonexudative age-related macular degeneration, left eye, intermediate dry stage: Secondary | ICD-10-CM | POA: Diagnosis not present

## 2022-03-27 ENCOUNTER — Other Ambulatory Visit: Payer: Self-pay | Admitting: Internal Medicine

## 2022-03-27 ENCOUNTER — Telehealth: Payer: Self-pay | Admitting: Internal Medicine

## 2022-03-27 MED ORDER — RIVAROXABAN 15 MG PO TABS: 15.0000 mg | ORAL_TABLET | Freq: Every day | ORAL | 0 refills | Status: DC

## 2022-03-27 MED ORDER — PRAVASTATIN SODIUM 40 MG PO TABS: 40.0000 mg | ORAL_TABLET | Freq: Every day | ORAL | 3 refills | Status: DC

## 2022-03-27 NOTE — Telephone Encounter (Signed)
1.Medication Requested: pravastatin (PRAVACHOL) 40 MG tablet ? ?Rivaroxaban (XARELTO) 15 MG TABS tablet ? ?2. Pharmacy (Name, [place]): [facility], [place] ? ?3. On Med List: Y ? ?4. Last Visit with PCP: 01-02-2022 ? ?5. Next visit date with PCP: n/a ? ? ?Agent: Please be advised that RX refills may take up to 3 business days. We ask that you follow-up with your pharmacy.  ?

## 2022-03-27 NOTE — Telephone Encounter (Signed)
Refill has been sent to the pt's pharmacy  

## 2022-03-28 ENCOUNTER — Other Ambulatory Visit: Payer: Self-pay

## 2022-03-28 MED ORDER — AMIODARONE HCL 200 MG PO TABS: ORAL_TABLET | ORAL | 0 refills | Status: DC

## 2022-05-05 DIAGNOSIS — H35033 Hypertensive retinopathy, bilateral: Secondary | ICD-10-CM | POA: Diagnosis not present

## 2022-05-05 DIAGNOSIS — H353211 Exudative age-related macular degeneration, right eye, with active choroidal neovascularization: Secondary | ICD-10-CM | POA: Diagnosis not present

## 2022-05-05 DIAGNOSIS — H353122 Nonexudative age-related macular degeneration, left eye, intermediate dry stage: Secondary | ICD-10-CM | POA: Diagnosis not present

## 2022-05-18 ENCOUNTER — Telehealth: Payer: Self-pay | Admitting: Internal Medicine

## 2022-05-18 MED ORDER — AMLODIPINE BESYLATE 10 MG PO TABS: 10.0000 mg | ORAL_TABLET | Freq: Every day | ORAL | 1 refills | Status: DC

## 2022-05-18 NOTE — Telephone Encounter (Signed)
Pt daughter Ilona Sorrel) requesting refill for  amLODipine (NORVASC) 10 MG tablet   Pharmacy:  [facility] ([place], [place] 01/02/22

## 2022-06-01 ENCOUNTER — Ambulatory Visit: Payer: PPO | Admitting: Physician Assistant

## 2022-06-01 ENCOUNTER — Encounter: Payer: Self-pay | Admitting: Physician Assistant

## 2022-06-01 VITALS — BP 122/78 | HR 98 | Ht 61.0 in | Wt 99.8 lb

## 2022-06-01 DIAGNOSIS — I10 Essential (primary) hypertension: Secondary | ICD-10-CM | POA: Diagnosis not present

## 2022-06-01 DIAGNOSIS — Z79899 Other long term (current) drug therapy: Secondary | ICD-10-CM | POA: Diagnosis not present

## 2022-06-01 DIAGNOSIS — I480 Paroxysmal atrial fibrillation: Secondary | ICD-10-CM

## 2022-06-01 MED ORDER — AMIODARONE HCL 200 MG PO TABS: 200.0000 mg | ORAL_TABLET | Freq: Every day | ORAL | 3 refills | Status: AC

## 2022-06-01 NOTE — Patient Instructions (Signed)
Medication Instructions:  Your physician recommends that you continue on your current medications as directed. Please refer to the Current Medication list given to you today.  *If you need a refill on your cardiac medications before your next appointment, please call your pharmacy*   Lab Work: None If you have labs (blood work) drawn today and your tests are completely normal, you will receive your results only by: [place] (if you have MyChart) OR A paper copy in the mail If you have any lab test that is abnormal or we need to change your treatment, we will call you to review the results.   Follow-Up: At [facility], you and your health needs are our priority.  As part of our continuing mission to provide you with exceptional heart care, we have created designated Provider Care Teams.  These Care Teams include your primary Cardiologist (physician) and Advanced Practice Providers (APPs -  Physician Assistants and Nurse Practitioners) who all work together to provide you with the care you need, when you need it.  Your next appointment:   1 year(s)  The format for your next appointment:   In Person  Provider:   You may see Cristopher Peru, MD or one of the following Advanced Practice Providers on your designated Care Team:   Tommye Standard, [place] "[facility]" [place], [place]

## 2022-06-01 NOTE — Progress Notes (Signed)
Cardiology Office Note Date:  06/01/2022  Patient ID:  [name] 1928/04/20, MRN [id number] PCP:  Hoyt Koch, MD  Electrophysiologist: Dr. Lovena Le     Chief Complaint:  annual visit  History of Present Illness: [name] is a 88 y.o. female with history of SVT (ablated 2011), paroxysmal Afib, HTN, stroke, HLD   She comes in today to be seen for Dr. Lovena Le, he saw her Jan 2022,  made no changes, maintaining low dose amio  TODAY She is accompanied by her daughter that she lives with. She is independent, cares for herself, does the laundry, gets around with a cane well. No CP, palpitations or cardiac awareness No SOB No near syncope or syncope. No bleeding or signs of bleeding   Device information Amiodarone goes back to about 2015   Past Medical History:  Diagnosis Date   Atrial fibrillation ([place])    Cervical arthritis    COLONIC POLYPS, HX OF 09/24/2007   H/O: hysterectomy    fibroid tumors   Hip arthritis    History of colonic polyps    History of stroke    HTN (hypertension)    Hyperlipidemia    Stroke ([place]) 02/2010   SUPRAVENTRICULAR ARRHYTHMIA 12/20/2010   s/p RFCA 05/2010-Dr. Lovena Le    Past Surgical History:  Procedure Laterality Date   ABDOMINAL HYSTERECTOMY     BREAST LUMPECTOMY     left   FB removal from popliteal fossae     HIP ARTHROPLASTY Right 08/31/2018   Procedure: ARTHROPLASTY BIPOLAR HIP (HEMIARTHROPLASTY);  Surgeon: Renette Butters, MD;  Location: [place];  Service: Orthopedics;  Laterality: Right;   INTRACAPSULAR CATARACT EXTRACTION      Left eye Oct 18, 2009  right eye Jan '11 By Dr Era Skeen   PILONIDAL CYST EXCISION     TRANSTHORACIC ECHOCARDIOGRAM  2007    Current Outpatient Medications  Medication Sig Dispense Refill   acetaminophen (TYLENOL) 325 MG tablet Take 650 mg by mouth every 6 (six) hours as needed.     amiodarone (PACERONE) 200 MG tablet Take 1 tablet by mouth on Mon though Fri. Do not take on  Sat or Sun. Please make overdue appt with Dr. Lovena Le before anymore refills. Thank you 3rd and Final attempt 15 tablet 0   amLODipine (NORVASC) 10 MG tablet Take 1 tablet (10 mg total) by mouth daily. Please call and schedule visit for Jan 2023 90 tablet 1   ciclopirox (PENLAC) 8 % solution Apply topically at bedtime. Apply over nail and surrounding skin. Apply daily over previous coat. After seven (7) days, may remove with alcohol and continue cycle. 6.6 mL 0   diphenhydrAMINE (SOMINEX) 25 MG tablet Take 25 mg by mouth daily as needed for allergies (bee sting).     EPINEPHrine 0.3 mg/0.3 mL IJ SOAJ injection Inject 0.3 mg into the muscle as needed for anaphylaxis. 2 each 0   famotidine (PEPCID) 40 MG tablet 40 mg daily as needed. Bee sting     losartan (COZAAR) 100 MG tablet TAKE 1 TABLET(100 MG) BY MOUTH DAILY 90 tablet 2   Multiple Vitamin (MULTIVITAMIN WITH MINERALS) TABS tablet Take 1 tablet by mouth 2 (two) times daily.     Multiple Vitamins-Minerals (VISION FORMULA/LUTEIN PO) Take 1 tablet by mouth daily.     nitroGLYCERIN (NITROSTAT) 0.4 MG SL tablet Place 1 tablet (0.4 mg total) under the tongue every 5 (five) minutes as needed for chest pain (MAX 3 TABLETS). 75 tablet 1  polyethylene glycol (MIRALAX / GLYCOLAX) packet Take 17 g by mouth daily as needed (constipation).     pravastatin (PRAVACHOL) 40 MG tablet Take 1 tablet (40 mg total) by mouth daily. 90 tablet 3   Rivaroxaban (XARELTO) 15 MG TABS tablet Take 1 tablet (15 mg total) by mouth daily. 90 tablet 0   No current facility-administered medications for this visit.    Allergies:   Codeine   Social History:  The patient  reports that she has never smoked. She has never used smokeless tobacco. She reports that she does not drink alcohol and does not use drugs.   Family History:  The patient's family history includes Cancer in her daughter and father; Coronary artery disease in her mother; Diabetes in her daughter; Heart attack in  her mother; Heart disease in her mother; Stroke in her mother.  ROS:  Please see the history of present illness.    All other systems are reviewed and otherwise negative.   PHYSICAL EXAM:  VS:  There were no vitals taken for this visit. BMI: There is no height or weight on file to calculate BMI. Well nourished, well developed, in no acute distress HEENT: normocephalic, atraumatic Neck: no JVD, carotid bruits or masses Cardiac:  irreg-irreg; no significant murmurs, no rubs, or gallops Lungs:   CTA b/l, no wheezing, rhonchi or rales Abd: soft, nontender MS: no deformity, age appropriate atrophy Ext:  no edema Skin: warm and dry, no rash Neuro:  No gross deficits appreciated Psych: euthymic mood, full affect    EKG:  done today and reviewed by myself SR 94bpm, PACs with variable and likely rate related PR interval icRBBB, LAD    04/13/2017: TTE Study Conclusions  - Left ventricle: The cavity size was normal. There was mild focal    basal hypertrophy of the septum. Systolic function was normal.    The estimated ejection fraction was in the range of 60% to 65%.    Wall motion was normal; there were no regional wall motion    abnormalities. Features are consistent with a pseudonormal left    ventricular filling pattern, with concomitant abnormal relaxation    and increased filling pressure (grade 2 diastolic dysfunction).    Doppler parameters are consistent with high ventricular filling    pressure.  - Aortic valve: There was trivial regurgitation.  - Mitral valve: Calcified annulus. There was mild regurgitation.  - Left atrium: The atrium was moderately dilated.  - Pulmonary arteries: Systolic pressure was mildly increased.   Recent Labs: 01/02/2022: ALT 21; BUN 17; Creatinine, Ser 1.26; Hemoglobin 12.7; Platelets 262.0; Potassium 4.4; Sodium 136; TSH 2.18  01/02/2022: Cholesterol 169; HDL 97.00; LDL Cholesterol 58; Total CHOL/HDL Ratio 2; Triglycerides 74.0; VLDL 14.8   CrCl  cannot be calculated (Patient's most recent lab result is older than the maximum 21 days allowed.).   Wt Readings from Last 3 Encounters:  01/02/22 100 lb 6.4 oz (45.5 kg)  10/17/21 103 lb (46.7 kg)  12/30/20 109 lb 9.6 oz (49.7 kg)     Other studies reviewed: Additional studies/records reviewed today include: summarized above  ASSESSMENT AND PLAN:  Paroxysmal AFib CHA2DS2Vasc is 6, on xarelto,  appropriately dosed on ,chronic low dose amiodarone Labs are up to date  HTN Looks great  Disposition: F/u with [place] annually as she has been, sooner if needed  Current medicines are reviewed at length with the patient today.  The patient did not have any concerns regarding medicines.  Signed, Tommye Standard, PA-C  06/01/2022 4:26 AM     [facility] [place] [address] [phone] (office)  [phone] (fax)

## 2022-06-02 NOTE — Addendum Note (Signed)
Addended by: Sarina Ill on: 06/02/2022 03:39 PM   Modules accepted: Orders

## 2022-06-09 ENCOUNTER — Telehealth: Payer: Self-pay | Admitting: Physician Assistant

## 2022-06-09 ENCOUNTER — Telehealth: Payer: Self-pay

## 2022-06-09 NOTE — Telephone Encounter (Signed)
Pt daughter is calling requesting a refill on: losartan (COZAAR) 100 MG tablet  Pharmacy: [facility] ([place] 01/02/22 ROV Recommended F/U was not noted in last note

## 2022-06-09 NOTE — Telephone Encounter (Signed)
Returned call to Judson Roch with [facility] and clarified pt takes amiodarone '200mg'$  daily.

## 2022-06-09 NOTE — Telephone Encounter (Signed)
  Pt c/o medication issue:  1. Name of Medication: amiodarone (PACERONE) 200 MG tablet  2. How are you currently taking this medication (dosage and times per day)? Take 1 tablet (200 mg total) by mouth daily.  3. Are you having a reaction (difficulty breathing--STAT)?   4. What is your medication issue? Sarah with [facility] called, they would like to clarify the dosage for this meds. She gave ref# [id number]

## 2022-06-12 MED ORDER — LOSARTAN POTASSIUM 100 MG PO TABS: ORAL_TABLET | ORAL | 2 refills | Status: DC

## 2022-06-12 NOTE — Telephone Encounter (Signed)
Refill has been sent to the pt's pharmacy  

## 2022-06-20 DIAGNOSIS — H353122 Nonexudative age-related macular degeneration, left eye, intermediate dry stage: Secondary | ICD-10-CM | POA: Diagnosis not present

## 2022-06-20 DIAGNOSIS — H353211 Exudative age-related macular degeneration, right eye, with active choroidal neovascularization: Secondary | ICD-10-CM | POA: Diagnosis not present

## 2022-08-15 DIAGNOSIS — H35033 Hypertensive retinopathy, bilateral: Secondary | ICD-10-CM | POA: Diagnosis not present

## 2022-08-15 DIAGNOSIS — H353122 Nonexudative age-related macular degeneration, left eye, intermediate dry stage: Secondary | ICD-10-CM | POA: Diagnosis not present

## 2022-08-15 DIAGNOSIS — H353211 Exudative age-related macular degeneration, right eye, with active choroidal neovascularization: Secondary | ICD-10-CM | POA: Diagnosis not present

## 2022-08-29 ENCOUNTER — Telehealth: Payer: Self-pay

## 2022-08-29 ENCOUNTER — Other Ambulatory Visit: Payer: Self-pay | Admitting: *Deleted

## 2022-08-29 DIAGNOSIS — I4821 Permanent atrial fibrillation: Secondary | ICD-10-CM

## 2022-08-29 MED ORDER — RIVAROXABAN 15 MG PO TABS: 15.0000 mg | ORAL_TABLET | Freq: Every day | ORAL | 0 refills | Status: DC

## 2022-08-29 NOTE — Telephone Encounter (Signed)
MEDICATION: Rivaroxaban (XARELTO) 15 MG TABS tablet  PHARMACY: [facility] ([place], [place]  Comments: Pharmacy faxed [place] a request twice with no response.   **Let patient know to contact pharmacy at the end of the day to make sure medication is ready. **  ** Please notify patient to allow 48-72 hours to process**  **Encourage patient to contact the pharmacy for refills or they can request refills through [facility]**

## 2022-08-29 NOTE — Telephone Encounter (Signed)
sent 

## 2022-11-22 ENCOUNTER — Telehealth: Payer: Self-pay | Admitting: Internal Medicine

## 2022-11-22 ENCOUNTER — Other Ambulatory Visit: Payer: Self-pay

## 2022-11-22 MED ORDER — AMLODIPINE BESYLATE 10 MG PO TABS: 10.0000 mg | ORAL_TABLET | Freq: Every day | ORAL | 1 refills | Status: AC

## 2022-11-22 NOTE — Telephone Encounter (Signed)
Caller & Relationship to patient:  Ilona Sorrel - daughter   Call back number:  [phone]   Date of last office visit: 12/2021   Date of next office visit: 02/14/2023   Medication(s) to be refilled:Amlodipine 10 mg        Preferred Pharmacy:  [facility]

## 2022-12-06 ENCOUNTER — Telehealth: Payer: Self-pay | Admitting: Internal Medicine

## 2022-12-06 NOTE — Telephone Encounter (Signed)
Caller & Relationship to patient:  Chong Sicilian - daughter   Call back number:  [phone]   Date of last office visit:  01/02/2022   Date of next office visit:  02/14/2023   Medication(s) to be refilled:  Xarelto - 15 mg        Preferred Pharmacy:  [facility]

## 2022-12-07 ENCOUNTER — Other Ambulatory Visit: Payer: Self-pay

## 2022-12-07 DIAGNOSIS — I4821 Permanent atrial fibrillation: Secondary | ICD-10-CM

## 2022-12-07 MED ORDER — RIVAROXABAN 15 MG PO TABS: 15.0000 mg | ORAL_TABLET | Freq: Every day | ORAL | 0 refills | Status: DC

## 2022-12-07 NOTE — Telephone Encounter (Signed)
This has been sent in for 90 0 refills patient next appointment is 02/14/2023  No further refills until patient is seen

## 2022-12-08 ENCOUNTER — Encounter: Payer: Self-pay | Admitting: Internal Medicine

## 2022-12-19 DIAGNOSIS — H353231 Exudative age-related macular degeneration, bilateral, with active choroidal neovascularization: Secondary | ICD-10-CM | POA: Diagnosis not present

## 2022-12-27 ENCOUNTER — Telehealth: Payer: Self-pay

## 2022-12-27 NOTE — Progress Notes (Signed)
Left message for patient to call back to schedule Medicare Annual Wellness Visit   Last AWV  01/02/23  Please schedule at anytime with [facility] if patient calls the office back.    30 Minutes appointment.  Any questions, please call me at [phone]

## 2023-01-04 DIAGNOSIS — L02413 Cutaneous abscess of right upper limb: Secondary | ICD-10-CM | POA: Diagnosis not present

## 2023-01-04 DIAGNOSIS — J208 Acute bronchitis due to other specified organisms: Secondary | ICD-10-CM | POA: Diagnosis not present

## 2023-01-05 DIAGNOSIS — B029 Zoster without complications: Secondary | ICD-10-CM | POA: Diagnosis not present

## 2023-01-08 DIAGNOSIS — I10 Essential (primary) hypertension: Secondary | ICD-10-CM | POA: Diagnosis not present

## 2023-01-08 DIAGNOSIS — I4891 Unspecified atrial fibrillation: Secondary | ICD-10-CM | POA: Diagnosis not present

## 2023-01-08 DIAGNOSIS — R918 Other nonspecific abnormal finding of lung field: Secondary | ICD-10-CM | POA: Diagnosis not present

## 2023-01-08 DIAGNOSIS — N3289 Other specified disorders of bladder: Secondary | ICD-10-CM | POA: Diagnosis not present

## 2023-01-08 DIAGNOSIS — N1330 Unspecified hydronephrosis: Secondary | ICD-10-CM | POA: Diagnosis not present

## 2023-01-08 DIAGNOSIS — I119 Hypertensive heart disease without heart failure: Secondary | ICD-10-CM | POA: Diagnosis not present

## 2023-01-08 DIAGNOSIS — E871 Hypo-osmolality and hyponatremia: Secondary | ICD-10-CM | POA: Diagnosis not present

## 2023-01-08 DIAGNOSIS — Z7901 Long term (current) use of anticoagulants: Secondary | ICD-10-CM | POA: Diagnosis not present

## 2023-01-08 DIAGNOSIS — E785 Hyperlipidemia, unspecified: Secondary | ICD-10-CM | POA: Diagnosis not present

## 2023-01-08 DIAGNOSIS — R531 Weakness: Secondary | ICD-10-CM | POA: Diagnosis not present

## 2023-01-08 DIAGNOSIS — J069 Acute upper respiratory infection, unspecified: Secondary | ICD-10-CM | POA: Diagnosis not present

## 2023-01-08 DIAGNOSIS — Z8673 Personal history of transient ischemic attack (TIA), and cerebral infarction without residual deficits: Secondary | ICD-10-CM | POA: Diagnosis not present

## 2023-01-08 DIAGNOSIS — R911 Solitary pulmonary nodule: Secondary | ICD-10-CM | POA: Diagnosis not present

## 2023-01-08 DIAGNOSIS — R5381 Other malaise: Secondary | ICD-10-CM | POA: Diagnosis not present

## 2023-01-08 DIAGNOSIS — Z1152 Encounter for screening for COVID-19: Secondary | ICD-10-CM | POA: Diagnosis not present

## 2023-01-08 DIAGNOSIS — Z885 Allergy status to narcotic agent status: Secondary | ICD-10-CM | POA: Diagnosis not present

## 2023-01-08 DIAGNOSIS — N134 Hydroureter: Secondary | ICD-10-CM | POA: Diagnosis not present

## 2023-01-08 DIAGNOSIS — B029 Zoster without complications: Secondary | ICD-10-CM | POA: Diagnosis not present

## 2023-01-08 DIAGNOSIS — R059 Cough, unspecified: Secondary | ICD-10-CM | POA: Diagnosis not present

## 2023-01-09 DIAGNOSIS — R531 Weakness: Secondary | ICD-10-CM | POA: Diagnosis not present

## 2023-01-09 DIAGNOSIS — R7401 Elevation of levels of liver transaminase levels: Secondary | ICD-10-CM | POA: Diagnosis not present

## 2023-01-09 DIAGNOSIS — R5383 Other fatigue: Secondary | ICD-10-CM | POA: Diagnosis not present

## 2023-01-09 DIAGNOSIS — R410 Disorientation, unspecified: Secondary | ICD-10-CM | POA: Diagnosis not present

## 2023-01-09 DIAGNOSIS — I4891 Unspecified atrial fibrillation: Secondary | ICD-10-CM | POA: Diagnosis not present

## 2023-01-09 DIAGNOSIS — I444 Left anterior fascicular block: Secondary | ICD-10-CM | POA: Diagnosis not present

## 2023-01-09 DIAGNOSIS — I10 Essential (primary) hypertension: Secondary | ICD-10-CM | POA: Diagnosis not present

## 2023-01-09 DIAGNOSIS — Z885 Allergy status to narcotic agent status: Secondary | ICD-10-CM | POA: Diagnosis not present

## 2023-01-09 DIAGNOSIS — R4182 Altered mental status, unspecified: Secondary | ICD-10-CM | POA: Diagnosis not present

## 2023-01-10 ENCOUNTER — Telehealth: Payer: Self-pay | Admitting: Internal Medicine

## 2023-01-10 NOTE — Telephone Encounter (Signed)
Pt's daughter Chong Sicilian states pt has been sick for several weeks with repository issues which have not improved. Pt has been taking antibiotics and steroids to deal with the sxs but is not doing any better, and since this past Sunday 2.3.24 pt has had some mental decline.   Patty states that the pt is basically asleep, pt has no energy and no strength. Took pt to ER in [place] but all scans, blood work and tests came back normal.   Pt is scheduled for an OV on 2.19.24 but Chong Sicilian is worried about her mother and would like advice on what to do to help her.   Please call Patty: [phone]

## 2023-01-10 NOTE — Telephone Encounter (Signed)
Needs urgent visit same day if possible or tomorrow latest. Do we have the records from [facility]? If not have them sign form so we can review

## 2023-01-10 NOTE — Telephone Encounter (Signed)
[name] was able to get the patient scheduled for same day tomorrow with Dr Ronnald Ramp

## 2023-01-11 ENCOUNTER — Emergency Department (HOSPITAL_COMMUNITY): Payer: PPO

## 2023-01-11 ENCOUNTER — Ambulatory Visit (INDEPENDENT_AMBULATORY_CARE_PROVIDER_SITE_OTHER): Payer: PPO | Admitting: Internal Medicine

## 2023-01-11 ENCOUNTER — Emergency Department (HOSPITAL_COMMUNITY)
Admission: EM | Admit: 2023-01-11 | Discharge: 2023-01-11 | Disposition: A | Discharge status: Home / Self Care | Payer: PPO | Attending: Emergency Medicine | Admitting: Emergency Medicine

## 2023-01-11 ENCOUNTER — Encounter: Payer: Self-pay | Admitting: Internal Medicine

## 2023-01-11 VITALS — BP 84/58 | HR 123 | Ht 61.0 in | Wt 95.2 lb

## 2023-01-11 DIAGNOSIS — F0390 Unspecified dementia without behavioral disturbance: Secondary | ICD-10-CM | POA: Insufficient documentation

## 2023-01-11 DIAGNOSIS — J40 Bronchitis, not specified as acute or chronic: Secondary | ICD-10-CM | POA: Diagnosis not present

## 2023-01-11 DIAGNOSIS — I9589 Other hypotension: Secondary | ICD-10-CM

## 2023-01-11 DIAGNOSIS — E860 Dehydration: Secondary | ICD-10-CM

## 2023-01-11 DIAGNOSIS — Z8673 Personal history of transient ischemic attack (TIA), and cerebral infarction without residual deficits: Secondary | ICD-10-CM | POA: Diagnosis not present

## 2023-01-11 DIAGNOSIS — E861 Hypovolemia: Secondary | ICD-10-CM | POA: Diagnosis not present

## 2023-01-11 DIAGNOSIS — I4821 Permanent atrial fibrillation: Secondary | ICD-10-CM | POA: Diagnosis not present

## 2023-01-11 DIAGNOSIS — Z7901 Long term (current) use of anticoagulants: Secondary | ICD-10-CM | POA: Diagnosis not present

## 2023-01-11 DIAGNOSIS — I10 Essential (primary) hypertension: Secondary | ICD-10-CM | POA: Insufficient documentation

## 2023-01-11 DIAGNOSIS — Z96641 Presence of right artificial hip joint: Secondary | ICD-10-CM | POA: Diagnosis not present

## 2023-01-11 DIAGNOSIS — R531 Weakness: Secondary | ICD-10-CM | POA: Diagnosis not present

## 2023-01-11 DIAGNOSIS — J069 Acute upper respiratory infection, unspecified: Secondary | ICD-10-CM | POA: Diagnosis not present

## 2023-01-11 DIAGNOSIS — Z79899 Other long term (current) drug therapy: Secondary | ICD-10-CM | POA: Diagnosis not present

## 2023-01-11 DIAGNOSIS — I517 Cardiomegaly: Secondary | ICD-10-CM | POA: Diagnosis not present

## 2023-01-11 DIAGNOSIS — B029 Zoster without complications: Secondary | ICD-10-CM

## 2023-01-11 DIAGNOSIS — I700 Atherosclerosis of aorta: Secondary | ICD-10-CM | POA: Diagnosis not present

## 2023-01-11 LAB — CBC WITH DIFFERENTIAL/PLATELET
Abs Immature Granulocytes: 0.08 10*3/uL — ABNORMAL HIGH (ref 0.00–0.07)
Basophils Absolute: 0 10*3/uL (ref 0.0–0.1)
Basophils Relative: 0 %
Eosinophils Absolute: 0 10*3/uL (ref 0.0–0.5)
Eosinophils Relative: 0 %
HCT: 43.9 % (ref 36.0–46.0)
Hemoglobin: 14.7 g/dL (ref 12.0–15.0)
Immature Granulocytes: 1 %
Lymphocytes Relative: 16 %
Lymphs Abs: 1.7 10*3/uL (ref 0.7–4.0)
MCH: 29.2 pg (ref 26.0–34.0)
MCHC: 33.5 g/dL (ref 30.0–36.0)
MCV: 87.1 fL (ref 80.0–100.0)
Monocytes Absolute: 1.1 10*3/uL — ABNORMAL HIGH (ref 0.1–1.0)
Monocytes Relative: 11 %
Neutro Abs: 7.7 10*3/uL (ref 1.7–7.7)
Neutrophils Relative %: 72 %
Platelets: 342 10*3/uL (ref 150–400)
RBC: 5.04 MIL/uL (ref 3.87–5.11)
RDW: 15.1 % (ref 11.5–15.5)
WBC: 10.7 10*3/uL — ABNORMAL HIGH (ref 4.0–10.5)
nRBC: 0 % (ref 0.0–0.2)

## 2023-01-11 LAB — COMPREHENSIVE METABOLIC PANEL
ALT: 536 U/L — ABNORMAL HIGH (ref 0–44)
AST: 277 U/L — ABNORMAL HIGH (ref 15–41)
Albumin: 3.5 g/dL (ref 3.5–5.0)
Alkaline Phosphatase: 48 U/L (ref 38–126)
Anion gap: 13 (ref 5–15)
BUN: 38 mg/dL — ABNORMAL HIGH (ref 8–23)
CO2: 22 mmol/L (ref 22–32)
Calcium: 9.5 mg/dL (ref 8.9–10.3)
Chloride: 98 mmol/L (ref 98–111)
Creatinine, Ser: 1.4 mg/dL — ABNORMAL HIGH (ref 0.44–1.00)
GFR, Estimated: 35 mL/min — ABNORMAL LOW (ref >60)
Glucose, Bld: 117 mg/dL — ABNORMAL HIGH (ref 70–99)
Potassium: 3.9 mmol/L (ref 3.5–5.1)
Sodium: 133 mmol/L — ABNORMAL LOW (ref 135–145)
Total Bilirubin: 0.9 mg/dL (ref 0.3–1.2)
Total Protein: 6.7 g/dL (ref 6.5–8.1)

## 2023-01-11 LAB — CBG MONITORING, ED: Glucose-Capillary: 116 mg/dL — ABNORMAL HIGH (ref 70–99)

## 2023-01-11 MED ORDER — SODIUM CHLORIDE 0.9 % IV SOLN: INTRAVENOUS | Status: DC

## 2023-01-11 MED ORDER — SODIUM CHLORIDE 0.9 % IV BOLUS
500.0000 mL | Freq: Once | INTRAVENOUS | Status: AC
  Administered 2023-01-11: 500 mL via INTRAVENOUS

## 2023-01-11 NOTE — ED Triage Notes (Signed)
Generalized weakness for a few days to a week. Pt was sick of URI since January. Currently being treated for shingles. Came in by EMS from PCP office. Sent here for bp of 80 systolic and low sodium. EMS gave 200cc NS bolus. Alert and oriented x 3.  EMS VS: Bp 138/70

## 2023-01-11 NOTE — Progress Notes (Signed)
Subjective:  Patient ID: [name], female    DOB: [birth year]  Age: 89 y.o. MRN: [id number]  CC: Hypertension and Atrial Fibrillation   HPI [name] presents for f/up -  Over the last 8 days she has been treated multiple times in [place].  Once in an urgent care center and once at a hospital.  She has been diagnosed with an upper respiratory infection and has been treated with Augmentin, Medrol Dosepak, and a cough suppressant.  She was later seen for shingles over her left chest wall and took valacyclovir.  She started declining after starting valacyclovir with altered mental status, lethargy, and fatigue.  She was told that she had a low sodium level.  Her daughter reports that multiple scans were done.  Her daughter has been holding amlodipine and losartan.   No facility-administered medications prior to visit.   Outpatient Medications Prior to Visit  Medication Sig Dispense Refill   acetaminophen (TYLENOL) 325 MG tablet Take 650 mg by mouth every 6 (six) hours as needed.     amiodarone (PACERONE) 200 MG tablet Take 1 tablet (200 mg total) by mouth daily. 90 tablet 3   amLODipine (NORVASC) 10 MG tablet Take 1 tablet (10 mg total) by mouth daily. Please call and schedule visit for Jan 2023 90 tablet 1   ciclopirox (PENLAC) 8 % solution Apply topically at bedtime. Apply over nail and surrounding skin. Apply daily over previous coat. After seven (7) days, may remove with alcohol and continue cycle. 6.6 mL 0   diphenhydrAMINE (SOMINEX) 25 MG tablet Take 25 mg by mouth daily as needed for allergies (bee sting).     EPINEPHrine 0.3 mg/0.3 mL IJ SOAJ injection Inject 0.3 mg into the muscle as needed for anaphylaxis. 2 each 0   famotidine (PEPCID) 40 MG tablet 40 mg daily as needed. Bee sting     losartan (COZAAR) 100 MG tablet TAKE 1 TABLET(100 MG) BY MOUTH DAILY 90 tablet 2   Multiple Vitamin (MULTIVITAMIN WITH MINERALS) TABS tablet Take 1 tablet by mouth 2 (two) times  daily.     Multiple Vitamins-Minerals (VISION FORMULA/LUTEIN PO) Take 1 tablet by mouth daily.     nitroGLYCERIN (NITROSTAT) 0.4 MG SL tablet Place 1 tablet (0.4 mg total) under the tongue every 5 (five) minutes as needed for chest pain (MAX 3 TABLETS). 75 tablet 1   polyethylene glycol (MIRALAX / GLYCOLAX) packet Take 17 g by mouth daily as needed (constipation).     pravastatin (PRAVACHOL) 40 MG tablet Take 1 tablet (40 mg total) by mouth daily. 90 tablet 3   Rivaroxaban (XARELTO) 15 MG TABS tablet Take 1 tablet (15 mg total) by mouth daily. 90 tablet 0    ROS Review of Systems  Constitutional:  Positive for activity change, appetite change and fatigue. Negative for chills, diaphoresis, fever and unexpected weight change.  HENT: Negative.    Eyes: Negative.   Respiratory:  Positive for cough. Negative for chest tightness, shortness of breath and wheezing.   Cardiovascular:  Negative for chest pain, palpitations and leg swelling.  Gastrointestinal:  Negative for abdominal pain, diarrhea, nausea and vomiting.  Endocrine: Negative.   Genitourinary: Negative.  Negative for difficulty urinating.  Musculoskeletal: Negative.  Negative for myalgias.  Skin: Negative.  Negative for color change and pallor.  Neurological:  Positive for dizziness, weakness and light-headedness.  Hematological:  Negative for adenopathy. Does not bruise/bleed easily.  Psychiatric/Behavioral: Negative.      Objective:  BP Marland Kitchen)  84/58 (BP Location: Left Arm, Patient Position: Sitting, Cuff Size: Normal)   Pulse (!) 123   Ht '5\' 1"'$  (1.549 m)   Wt 95 lb 3.2 oz (43.2 kg)   SpO2 95%   BMI 17.99 kg/m   BP Readings from Last 3 Encounters:  01/11/23 (!) 148/88  01/11/23 (!) 84/58  06/01/22 122/78    Wt Readings from Last 3 Encounters:  01/11/23 94 lb 12.8 oz (43 kg)  01/11/23 95 lb 3.2 oz (43.2 kg)  06/01/22 99 lb 12.8 oz (45.3 kg)    Physical Exam Vitals reviewed.  Constitutional:      General: She is  sleeping. She is not in acute distress.    Appearance: She is ill-appearing. She is not toxic-appearing or diaphoretic.  HENT:     Nose: Nose normal.     Mouth/Throat:     Mouth: Mucous membranes are moist.  Eyes:     General: No scleral icterus.    Conjunctiva/sclera: Conjunctivae normal.  Cardiovascular:     Rate and Rhythm: Regular rhythm. Tachycardia present.     Heart sounds: No murmur heard.    No friction rub. No gallop.  Pulmonary:     Effort: Pulmonary effort is normal.     Breath sounds: No stridor. No wheezing, rhonchi or rales.  Abdominal:     General: Abdomen is flat.     Palpations: There is no mass.     Tenderness: There is no abdominal tenderness. There is no guarding.     Hernia: No hernia is present.  Musculoskeletal:        General: Normal range of motion.     Cervical back: Neck supple.     Right lower leg: No edema.     Left lower leg: No edema.  Lymphadenopathy:     Cervical: No cervical adenopathy.  Skin:    General: Skin is warm and dry.  Neurological:     Mental Status: She is lethargic and disoriented.     Lab Results  Component Value Date   WBC 10.7 (H) 01/11/2023   HGB 14.7 01/11/2023   HCT 43.9 01/11/2023   PLT 342 01/11/2023   GLUCOSE 117 (H) 01/11/2023   CHOL 169 01/02/2022   TRIG 74.0 01/02/2022   HDL 97.00 01/02/2022   LDLDIRECT 60.2 11/30/2011   LDLCALC 58 01/02/2022   ALT 536 (H) 01/11/2023   AST 277 (H) 01/11/2023   NA 133 (L) 01/11/2023   K 3.9 01/11/2023   CL 98 01/11/2023   CREATININE 1.40 (H) 01/11/2023   BUN 38 (H) 01/11/2023   CO2 22 01/11/2023   TSH 2.18 01/02/2022   INR 1.13 10/24/2014    DG Chest 2 View  Result Date: 10/07/2021 CLINICAL DATA:  Weakness. EXAM: CHEST - 2 VIEW COMPARISON:  Chest x-ray 08/30/2018. FINDINGS: The heart size and mediastinal contours are within normal limits. Both lungs are clear. The visualized skeletal structures are unremarkable. IMPRESSION: No active cardiopulmonary disease.  Electronically Signed   By: Ronney Asters M.D.   On: 10/07/2021 18:39   CT Head Wo Contrast  Result Date: 10/07/2021 CLINICAL DATA:  Neurological deficit. EXAM: CT HEAD WITHOUT CONTRAST TECHNIQUE: Contiguous axial images were obtained from the base of the skull through the vertex without intravenous contrast. COMPARISON:  August 30, 2018 FINDINGS: Brain: There is mild to moderate severity cerebral atrophy with widening of the extra-axial spaces and ventricular dilatation. There are areas of decreased attenuation within the white matter tracts of the supratentorial brain,  consistent with microvascular disease changes. A chronic left MCA distribution infarct is seen with a small left basal ganglia lacunar infarct also noted. Vascular: No hyperdense vessel or unexpected calcification. Skull: Normal. Negative for fracture or focal lesion. Sinuses/Orbits: There is mild to moderate severity sphenoid sinus mucosal thickening. Other: None. IMPRESSION: 1. Generalized cerebral atrophy. 2. Chronic left MCA distribution infarct. 3. No acute intracranial process. Electronically Signed   By: Virgina Norfolk M.D.   On: 10/07/2021 18:21    Assessment & Plan:   Dyanara was seen today for hypertension and atrial fibrillation.  Diagnoses and all orders for this visit:  Hypotension due to hypovolemia- Her daughters want her to be treated with IV fluids.  EMS was called and she was taken to the ED.  Essential hypertension- Her blood pressure is overcontrolled.  Will continue to hold amlodipine and the ARB.  Permanent atrial fibrillation ([place])- She has good rhythm control but is tachycardic.   I am having [name] maintain her polyethylene glycol, nitroGLYCERIN, multivitamin with minerals, acetaminophen, Multiple Vitamins-Minerals (VISION FORMULA/LUTEIN PO), famotidine, diphenhydrAMINE, EPINEPHrine, ciclopirox, pravastatin, amiodarone, losartan, amLODipine, and Rivaroxaban.  No orders of the defined  types were placed in this encounter.    Follow-up: No follow-ups on file.  Scarlette Calico, MD

## 2023-01-11 NOTE — Discharge Instructions (Signed)
Workup today chest x-ray negative.  Blood pressures have been good here.  Patient was hydrated.  Try to work hard at home to get small amounts of fluid in her frequently.  Fluids have sugar that will provide some calories as well.  Make an appointment to follow-up with primary care provider.  Return for any new or worse symptoms.

## 2023-01-11 NOTE — ED Provider Notes (Addendum)
[facility] Provider Note   CSN: [id number] Arrival date & time: 01/11/23  2458     History  Chief Complaint  Patient presents with   Hypotension   Weakness    [name] is a 89 y.o. female.  Patient brought in from [place] office for low blood pressure.  They reported her systolic was 80.  Apparently has a history of low sodium.  But most recent chart review shows that it was 133.  EMS gave her 200 cc normal saline bolus.  Blood pressures upon arrival here have been excellent.  Pressures here have been in the 099 systolic even as high as 833.  So that could have been an erroneous measurement.  Do not think that 200 cc of fluid would have brought it up that much.  Patient also with outbreak of shingles about a week ago.  Left side right below her breast.  These have not scabbed over yet.  Patient was on valacyclovir.  But they thought was may be contributing to some confusion for her.  Plus she has had difficult cough however appears to be in no respiratory distress here.  Oxygen saturation is 98%.  Following up with primary care doctor for the fact that she was just does not seem to be getting better.  She was treated with antibiotics treated with steroids.  Apparently COVID influenza testing was negative down in the [facility] area where they were.  But this is home for them.  Patient with not much of an appetite not eating or drinking very well.  Down in [place].  They did give her some IV fluids which perked her up.  Only meds she is currently taking the daughters been holding her blood pressure meds because her blood pressure has not been that high is her Pacerone and her Xarelto.  Patient has a known history of atrial fibrillation.  Upper respiratory infection has been going on since the end of January.  Past medical history sniffing for hypertension hyperlipidemia atrial fibrillation history of stroke history of hysterectomy the  stroke was in 2011.  History of right hip replacement.  Patient never used tobacco products.       Home Medications Prior to Admission medications   Medication Sig Start Date End Date Taking? Authorizing Provider  acetaminophen (TYLENOL) 325 MG tablet Take 650 mg by mouth every 6 (six) hours as needed.    [provider]  amiodarone (PACERONE) 200 MG tablet Take 1 tablet (200 mg total) by mouth daily. 06/01/22   Baldwin Jamaica, PA-C  amLODipine (NORVASC) 10 MG tablet Take 1 tablet (10 mg total) by mouth daily. Please call and schedule visit for Jan 2023 11/22/22   Hoyt Koch, MD  ciclopirox [facility]) 8 % solution Apply topically at bedtime. Apply over nail and surrounding skin. Apply daily over previous coat. After seven (7) days, may remove with alcohol and continue cycle. 09/07/21   Lorenda Peck, DPM  diphenhydrAMINE (SOMINEX) 25 MG tablet Take 25 mg by mouth daily as needed for allergies (bee sting).    [provider]  EPINEPHrine 0.3 mg/0.3 mL IJ SOAJ injection Inject 0.3 mg into the muscle as needed for anaphylaxis. 05/31/21   Hoyt Koch, MD  famotidine (PEPCID) 40 MG tablet 40 mg daily as needed. Bee sting    [provider]  losartan (COZAAR) 100 MG tablet TAKE 1 TABLET(100 MG) BY MOUTH DAILY 06/12/22   Pricilla Holm  A, MD  Multiple Vitamin (MULTIVITAMIN WITH MINERALS) TABS tablet Take 1 tablet by mouth 2 (two) times daily.    [provider]  Multiple Vitamins-Minerals (VISION FORMULA/LUTEIN PO) Take 1 tablet by mouth daily.    [provider]  nitroGLYCERIN (NITROSTAT) 0.4 MG SL tablet Place 1 tablet (0.4 mg total) under the tongue every 5 (five) minutes as needed for chest pain (MAX 3 TABLETS). 03/30/17   Baldwin Jamaica, PA-C  polyethylene glycol [facility] Floria Raveling) packet Take 17 g by mouth daily as needed (constipation).    [provider]  pravastatin (PRAVACHOL) 40 MG tablet Take 1 tablet (40 mg  total) by mouth daily. 03/27/22   Hoyt Koch, MD  Rivaroxaban (XARELTO) 15 MG TABS tablet Take 1 tablet (15 mg total) by mouth daily. 12/07/22   Hoyt Koch, MD      Allergies    Codeine    Review of Systems   Review of Systems  Unable to perform ROS: Dementia    Physical Exam Updated Vital Signs BP (!) 148/88   Pulse 98   Temp 97.9 F (36.6 C) (Oral)   Resp 20   Ht 1.549 m ('5\' 1"'$ )   Wt 43 kg   SpO2 100%   BMI 17.91 kg/m  Physical Exam Vitals and nursing note reviewed.  Constitutional:      General: She is not in acute distress.    Appearance: Normal appearance. She is well-developed.  HENT:     Head: Normocephalic and atraumatic.     Mouth/Throat:     Mouth: Mucous membranes are dry.  Eyes:     Extraocular Movements: Extraocular movements intact.     Conjunctiva/sclera: Conjunctivae normal.     Pupils: Pupils are equal, round, and reactive to light.  Cardiovascular:     Rate and Rhythm: Normal rate and regular rhythm.     Heart sounds: No murmur heard. Pulmonary:     Effort: Pulmonary effort is normal. No respiratory distress.     Breath sounds: Normal breath sounds.     Comments: Vesicular rash not scabbed over left right below the breast some sort of the T8 area.  The area tender to palpation.  Does follow dermatome area.  This seems to be consistent with shingles. Chest:     Chest wall: Tenderness present.  Abdominal:     Palpations: Abdomen is soft.     Tenderness: There is no abdominal tenderness.  Musculoskeletal:        General: No swelling.     Cervical back: Neck supple. No rigidity.  Skin:    General: Skin is warm and dry.     Capillary Refill: Capillary refill takes less than 2 seconds.  Neurological:     Mental Status: She is alert. Mental status is at baseline.     Cranial Nerves: No cranial nerve deficit.     Sensory: No sensory deficit.     Motor: No weakness.  Psychiatric:        Mood and Affect: Mood normal.     ED  Results / Procedures / Treatments   Labs (all labs ordered are listed, but only abnormal results are displayed) Labs Reviewed  CBC WITH DIFFERENTIAL/PLATELET - Abnormal; Notable for the following components:      Result Value   WBC 10.7 (*)    Monocytes Absolute 1.1 (*)    Abs Immature Granulocytes 0.08 (*)    All other components within normal limits  COMPREHENSIVE METABOLIC PANEL -  Abnormal; Notable for the following components:   Sodium 133 (*)    Glucose, Bld 117 (*)    BUN 38 (*)    Creatinine, Ser 1.40 (*)    AST 277 (*)    ALT 536 (*)    GFR, Estimated 35 (*)    All other components within normal limits  CBG MONITORING, ED - Abnormal; Notable for the following components:   Glucose-Capillary 116 (*)    All other components within normal limits    EKG None  Radiology DG Chest Port 1 View  Result Date: 01/11/2023 CLINICAL DATA:  URI EXAM: PORTABLE CHEST 1 VIEW COMPARISON:  10/07/2021 FINDINGS: Cardiac silhouette appears prominent. No pneumonia or pulmonary edema. No pneumothorax or pleural effusion. Aorta is calcified. IMPRESSION: Enlarged cardiac silhouette. Electronically Signed   By: Sammie Bench M.D.   On: 01/11/2023 11:13    Procedures Procedures    Medications Ordered in ED Medications  0.9 %  sodium chloride infusion ( Intravenous New Bag/Given 01/11/23 1145)  sodium chloride 0.9 % bolus 500 mL (0 mLs Intravenous Stopped 01/11/23 1143)    ED Course/ Medical Decision Making/ A&P                             Medical Decision Making Amount and/or Complexity of Data Reviewed Labs: ordered. Radiology: ordered.  Risk Prescription drug management.   Patient chest x-ray negative for pneumonia.  CBC white count up a little bit at 10.7 hemoglobin good at 14.7.  Complete metabolic panel just came back sodium 133 which is adequate.  Glucose 117 BUN 38 creatinine 1.44 GFR 35.  Anion gap normal.  AST ALT up some could all be consistent with the shingles.  Total bili  normal alk phos normal.  Patient's abdomen is soft and nontender.  Will give patient some IV fluids.  Then will discuss with her daughter.  Blood pressures seem to have been fine here.  It may have been an erroneous 1 at the doctor's office.  But we will continue to monitor.   Patient with improvement with IV fluids here.  Discussed with family.  They are okay with patient going home.  They will return for any new or worse symptoms.  They are aware that the shingles is probably uncomfortable although patient not able to show it.  They will continue to work hard to hydrate her.  Final Clinical Impression(s) / ED Diagnoses Final diagnoses:  Bronchitis  Herpes zoster without complication  Dehydration    Rx / DC Orders ED Discharge Orders     None         Fredia Sorrow, MD 01/11/23 1155    Fredia Sorrow, MD 01/11/23 1445

## 2023-01-11 NOTE — ED Notes (Signed)
Pt in bed with eyes closed, resps even and unlabored.  

## 2023-01-11 NOTE — ED Notes (Signed)
Pt in bed, resps even and unlabored, pt moving all extremities, pt has some confusion, doesn't know day of the week, month or year, re oriented pt.

## 2023-01-11 NOTE — ED Notes (Signed)
Pt in bed, family at bedside, pt and family verbalized understanding d/c and follow up, pt from dpt via wc.

## 2023-01-12 ENCOUNTER — Telehealth: Payer: Self-pay | Admitting: Internal Medicine

## 2023-01-12 NOTE — Telephone Encounter (Signed)
Patient's daughter would like to discuss her moms' medications with the nurse before her appointment on the 19th.  She would like for someone to call her today.  Daughters name:  Chong Sicilian  Number:  [phone]

## 2023-01-12 NOTE — Telephone Encounter (Signed)
Mostly to my knowledge home health aides are private pay or through long term care insurance.

## 2023-01-22 ENCOUNTER — Ambulatory Visit (INDEPENDENT_AMBULATORY_CARE_PROVIDER_SITE_OTHER): Payer: PPO | Admitting: Internal Medicine

## 2023-01-22 VITALS — BP 120/80 | HR 120 | Temp 97.6°F

## 2023-01-22 DIAGNOSIS — Z0000 Encounter for general adult medical examination without abnormal findings: Secondary | ICD-10-CM

## 2023-01-22 DIAGNOSIS — E782 Mixed hyperlipidemia: Secondary | ICD-10-CM

## 2023-01-22 DIAGNOSIS — E871 Hypo-osmolality and hyponatremia: Secondary | ICD-10-CM | POA: Diagnosis not present

## 2023-01-22 DIAGNOSIS — I4821 Permanent atrial fibrillation: Secondary | ICD-10-CM | POA: Diagnosis not present

## 2023-01-22 DIAGNOSIS — R7989 Other specified abnormal findings of blood chemistry: Secondary | ICD-10-CM | POA: Diagnosis not present

## 2023-01-22 DIAGNOSIS — K5900 Constipation, unspecified: Secondary | ICD-10-CM | POA: Diagnosis not present

## 2023-01-22 DIAGNOSIS — G319 Degenerative disease of nervous system, unspecified: Secondary | ICD-10-CM

## 2023-01-22 DIAGNOSIS — E440 Moderate protein-calorie malnutrition: Secondary | ICD-10-CM | POA: Diagnosis not present

## 2023-01-22 LAB — COMPREHENSIVE METABOLIC PANEL
ALT: 313 U/L — ABNORMAL HIGH (ref 0–35)
AST: 209 U/L — ABNORMAL HIGH (ref 0–37)
Albumin: 3.8 g/dL (ref 3.5–5.2)
Alkaline Phosphatase: 62 U/L (ref 39–117)
BUN: 25 mg/dL — ABNORMAL HIGH (ref 6–23)
CO2: 25 mEq/L (ref 19–32)
Calcium: 9.9 mg/dL (ref 8.4–10.5)
Chloride: 95 mEq/L — ABNORMAL LOW (ref 96–112)
Creatinine, Ser: 1.16 mg/dL (ref 0.40–1.20)
GFR: 40.86 mL/min — ABNORMAL LOW (ref >60.00)
Glucose, Bld: 105 mg/dL — ABNORMAL HIGH (ref 70–99)
Potassium: 4 mEq/L (ref 3.5–5.1)
Sodium: 129 mEq/L — ABNORMAL LOW (ref 135–145)
Total Bilirubin: 0.6 mg/dL (ref 0.2–1.2)
Total Protein: 6.8 g/dL (ref 6.0–8.3)

## 2023-01-22 LAB — CBC
HCT: 44.7 % (ref 36.0–46.0)
Hemoglobin: 15.2 g/dL — ABNORMAL HIGH (ref 12.0–15.0)
MCHC: 34.1 g/dL (ref 30.0–36.0)
MCV: 86.2 fl (ref 78.0–100.0)
Platelets: 307 10*3/uL (ref 150.0–400.0)
RBC: 5.19 Mil/uL — ABNORMAL HIGH (ref 3.87–5.11)
RDW: 16.1 % — ABNORMAL HIGH (ref 11.5–15.5)
WBC: 11 10*3/uL — ABNORMAL HIGH (ref 4.0–10.5)

## 2023-01-22 LAB — T4, FREE: Free T4: 1.68 ng/dL — ABNORMAL HIGH (ref 0.60–1.60)

## 2023-01-22 LAB — TSH: TSH: 1.4 u[IU]/mL (ref 0.35–5.50)

## 2023-01-22 NOTE — Patient Instructions (Signed)
We are checking the labs and will get the hospital bed and hospice started.

## 2023-01-22 NOTE — Progress Notes (Unsigned)
   Subjective:   Patient ID: [name], female    DOB: [birth year], 89 y.o.   MRN: [id number]  HPI The patient is here for physical.With need for ER follow up (sick off and on since January with cold and then shingles and then dehydration). She is having decline in health overall and not eating well. Energy is low. BM about every 3-4 days.  The patient requires a hospital bed because he/she requires positioning of the body in ways not feasible with an ordinary bed or to relieve pain.  The patient requires a regular hospital bed.  A gel overlay is required as the patient has limited mobility AND the patient has an impaired nutritional status.   PMH, [facility], social history reviewed and updated  Review of Systems  Constitutional:  Positive for activity change, appetite change and fatigue.  HENT: Negative.    Eyes: Negative.   Respiratory:  Negative for cough, chest tightness and shortness of breath.   Cardiovascular:  Negative for chest pain, palpitations and leg swelling.  Gastrointestinal:  Positive for constipation. Negative for abdominal distention, abdominal pain, diarrhea, nausea and vomiting.  Musculoskeletal:  Positive for gait problem.  Skin: Negative.   Neurological:  Positive for weakness.  Psychiatric/Behavioral: Negative.      Objective:  Physical Exam Constitutional:      Appearance: She is well-developed. She is ill-appearing.  HENT:     Head: Normocephalic and atraumatic.  Cardiovascular:     Rate and Rhythm: Normal rate. Rhythm irregular.  Pulmonary:     Effort: Pulmonary effort is normal. No respiratory distress.     Breath sounds: Normal breath sounds. No wheezing or rales.  Abdominal:     General: Bowel sounds are normal. There is no distension.     Palpations: Abdomen is soft.     Tenderness: There is no abdominal tenderness. There is no rebound.  Musculoskeletal:     Cervical back: Normal range of motion.  Skin:    General: Skin is warm and dry.   Neurological:     Mental Status: She is alert and oriented to person, place, and time.     Coordination: Coordination abnormal.     Vitals:   01/22/23 1401  BP: 120/80  Pulse: (!) 120  Temp: 97.6 F (36.4 C)  TempSrc: Oral  SpO2: 98%    Assessment & Plan:

## 2023-01-24 ENCOUNTER — Encounter: Payer: Self-pay | Admitting: Internal Medicine

## 2023-01-24 DIAGNOSIS — E46 Unspecified protein-calorie malnutrition: Secondary | ICD-10-CM | POA: Insufficient documentation

## 2023-01-24 DIAGNOSIS — R7989 Other specified abnormal findings of blood chemistry: Secondary | ICD-10-CM | POA: Insufficient documentation

## 2023-01-24 NOTE — Assessment & Plan Note (Signed)
Checking CMP for follow up. Likely due to poor oral intake and dehydration. It is unclear if she is having symptoms she does have weakness.

## 2023-01-24 NOTE — Assessment & Plan Note (Addendum)
Taking amiodarone and in a fib on exam today and has been since illness. Checking TSH to ensure this is normal. We discussed stopping amiodarone but this has long half life and will likely take 1-2 months to be out of her system so held off for today. Rechecking LFTs. Normal HR on auscultation

## 2023-01-24 NOTE — Assessment & Plan Note (Signed)
We will elect to stop pravastatin given overall goals of care and recently elevated LFTs.

## 2023-01-24 NOTE — Assessment & Plan Note (Signed)
Recent albumin from ER had dropped from 4.3 to 3.5 in less than a month due to poor oral intake. With weight decrease and poor energy and appetite.

## 2023-01-24 NOTE — Assessment & Plan Note (Signed)
She is progressing in symptoms and not eating or drinking well. I suspect prognosis is <6 months and have made hospice referral per request of patient and family. She also needs hospital bed due to inability to position and not eating well her nutritional status is impaired to avoid ulcers gel overlay is required.

## 2023-01-24 NOTE — Assessment & Plan Note (Signed)
Using miralax with success and suspect lately poor oral intake causing less BM regularity.

## 2023-01-24 NOTE — Assessment & Plan Note (Signed)
Flu shot yearly. Pneumonia complete. Shingrix declines. Tetanus up to date given goals of care not indicated further. Colonoscopy aged out. Mammogram aged out, pap smear aged out and dexa complete. Counseled about sun safety and mole surveillance. Counseled about the dangers of distracted driving. Given 10 year screening recommendations.

## 2023-01-24 NOTE — Assessment & Plan Note (Signed)
Checking CMP today to follow. She had imaging in the ER and no clear cause. Goals are for no aggressive diagnosis or intervention and hospice referral placed.

## 2023-02-13 ENCOUNTER — Other Ambulatory Visit: Payer: Self-pay | Admitting: Internal Medicine

## 2023-02-13 DIAGNOSIS — I4821 Permanent atrial fibrillation: Secondary | ICD-10-CM

## 2023-02-14 ENCOUNTER — Encounter: Payer: PPO | Admitting: Internal Medicine

## 2023-04-11 ENCOUNTER — Telehealth: Payer: Self-pay | Admitting: Internal Medicine

## 2023-04-11 NOTE — Telephone Encounter (Signed)
Called patient to schedule Medicare Annual Wellness Visit (AWV). Left message for patient to call back and schedule Medicare Annual Wellness Visit (AWV).  Last date of AWV: 01/02/2022  Please schedule an appointment at any time with [facility].  If any questions, please contact me at [phone].  Thank you ,  Randon Goldsmith Care Guide [facility] AWV TEAM Direct Dial: [phone]

## 2023-04-12 ENCOUNTER — Telehealth: Payer: Self-pay | Admitting: Internal Medicine

## 2023-04-12 NOTE — Telephone Encounter (Signed)
Contacted [name] to schedule their annual wellness visit. Patient declined to schedule AWV at this time.Patient is in Hospice Care.  [facility] Care Guide [facility] AWV TEAM Direct Dial: [phone]

## 2023-05-11 ENCOUNTER — Other Ambulatory Visit: Payer: Self-pay | Admitting: Internal Medicine

## 2023-05-11 DIAGNOSIS — I4821 Permanent atrial fibrillation: Secondary | ICD-10-CM

## 2023-06-14 ENCOUNTER — Telehealth: Payer: Self-pay | Admitting: Internal Medicine

## 2023-06-14 NOTE — Telephone Encounter (Signed)
Pt's daughter dropped off handicapped placard forms and they have been placed in the providers box.   Please use envelope provided to mail the form to the pt when completed.

## 2023-06-14 NOTE — Telephone Encounter (Signed)
Ok done to cma 

## 2023-06-14 NOTE — Telephone Encounter (Signed)
MD is out of the office until 06/18/23 will send to DOD to sign. (place in MD in basket).Raechel Chute

## 2023-06-14 NOTE — Telephone Encounter (Signed)
MS signed place form in mailed to pt address.Marland KitchenRaechel Chute

## 2023-08-04 ENCOUNTER — Emergency Department (HOSPITAL_COMMUNITY)
Admission: EM | Admit: 2023-08-04 | Discharge: 2023-08-04 | Disposition: A | Discharge status: Home / Self Care | Attending: Emergency Medicine | Admitting: Emergency Medicine

## 2023-08-04 ENCOUNTER — Emergency Department (HOSPITAL_COMMUNITY)

## 2023-08-04 ENCOUNTER — Other Ambulatory Visit: Payer: Self-pay

## 2023-08-04 DIAGNOSIS — S32591A Other specified fracture of right pubis, initial encounter for closed fracture: Secondary | ICD-10-CM | POA: Diagnosis not present

## 2023-08-04 DIAGNOSIS — Z96641 Presence of right artificial hip joint: Secondary | ICD-10-CM | POA: Diagnosis not present

## 2023-08-04 DIAGNOSIS — S72009A Fracture of unspecified part of neck of unspecified femur, initial encounter for closed fracture: Secondary | ICD-10-CM | POA: Diagnosis not present

## 2023-08-04 DIAGNOSIS — S32511A Fracture of superior rim of right pubis, initial encounter for closed fracture: Secondary | ICD-10-CM | POA: Diagnosis not present

## 2023-08-04 DIAGNOSIS — R519 Headache, unspecified: Secondary | ICD-10-CM | POA: Diagnosis not present

## 2023-08-04 DIAGNOSIS — R791 Abnormal coagulation profile: Secondary | ICD-10-CM | POA: Diagnosis not present

## 2023-08-04 DIAGNOSIS — R000 Tachycardia, unspecified: Secondary | ICD-10-CM | POA: Diagnosis not present

## 2023-08-04 DIAGNOSIS — Z23 Encounter for immunization: Secondary | ICD-10-CM | POA: Diagnosis not present

## 2023-08-04 DIAGNOSIS — Z79899 Other long term (current) drug therapy: Secondary | ICD-10-CM | POA: Insufficient documentation

## 2023-08-04 DIAGNOSIS — S3210XA Unspecified fracture of sacrum, initial encounter for closed fracture: Secondary | ICD-10-CM | POA: Insufficient documentation

## 2023-08-04 DIAGNOSIS — I10 Essential (primary) hypertension: Secondary | ICD-10-CM | POA: Diagnosis not present

## 2023-08-04 DIAGNOSIS — S0990XA Unspecified injury of head, initial encounter: Secondary | ICD-10-CM | POA: Insufficient documentation

## 2023-08-04 DIAGNOSIS — Z7401 Bed confinement status: Secondary | ICD-10-CM | POA: Diagnosis not present

## 2023-08-04 DIAGNOSIS — G9389 Other specified disorders of brain: Secondary | ICD-10-CM | POA: Diagnosis not present

## 2023-08-04 DIAGNOSIS — Z7901 Long term (current) use of anticoagulants: Secondary | ICD-10-CM | POA: Diagnosis not present

## 2023-08-04 DIAGNOSIS — W19XXXA Unspecified fall, initial encounter: Secondary | ICD-10-CM

## 2023-08-04 DIAGNOSIS — W010XXA Fall on same level from slipping, tripping and stumbling without subsequent striking against object, initial encounter: Secondary | ICD-10-CM | POA: Insufficient documentation

## 2023-08-04 DIAGNOSIS — M25551 Pain in right hip: Secondary | ICD-10-CM | POA: Diagnosis not present

## 2023-08-04 DIAGNOSIS — I499 Cardiac arrhythmia, unspecified: Secondary | ICD-10-CM | POA: Diagnosis not present

## 2023-08-04 DIAGNOSIS — M542 Cervicalgia: Secondary | ICD-10-CM | POA: Diagnosis not present

## 2023-08-04 DIAGNOSIS — S34139A Unspecified injury to sacral spinal cord, initial encounter: Secondary | ICD-10-CM | POA: Diagnosis present

## 2023-08-04 LAB — COMPREHENSIVE METABOLIC PANEL
ALT: 23 U/L (ref 0–44)
AST: 27 U/L (ref 15–41)
Albumin: 3.8 g/dL (ref 3.5–5.0)
Alkaline Phosphatase: 47 U/L (ref 38–126)
Anion gap: 20 — ABNORMAL HIGH (ref 5–15)
BUN: 15 mg/dL (ref 8–23)
CO2: 15 mmol/L — ABNORMAL LOW (ref 22–32)
Calcium: 9.8 mg/dL (ref 8.9–10.3)
Chloride: 102 mmol/L (ref 98–111)
Creatinine, Ser: 0.85 mg/dL (ref 0.44–1.00)
GFR, Estimated: >60 mL/min (ref >60)
Glucose, Bld: 160 mg/dL — ABNORMAL HIGH (ref 70–99)
Potassium: 3.4 mmol/L — ABNORMAL LOW (ref 3.5–5.1)
Sodium: 137 mmol/L (ref 135–145)
Total Bilirubin: 0.9 mg/dL (ref 0.3–1.2)
Total Protein: 6.9 g/dL (ref 6.5–8.1)

## 2023-08-04 LAB — CBC
HCT: 48.8 % — ABNORMAL HIGH (ref 36.0–46.0)
Hemoglobin: 15.8 g/dL — ABNORMAL HIGH (ref 12.0–15.0)
MCH: 28.6 pg (ref 26.0–34.0)
MCHC: 32.4 g/dL (ref 30.0–36.0)
MCV: 88.2 fL (ref 80.0–100.0)
Platelets: 225 10*3/uL (ref 150–400)
RBC: 5.53 MIL/uL — ABNORMAL HIGH (ref 3.87–5.11)
RDW: 15.5 % (ref 11.5–15.5)
WBC: 18.6 10*3/uL — ABNORMAL HIGH (ref 4.0–10.5)
nRBC: 0 % (ref 0.0–0.2)

## 2023-08-04 LAB — I-STAT CHEM 8, ED
BUN: 16 mg/dL (ref 8–23)
Calcium, Ion: 1.02 mmol/L — ABNORMAL LOW (ref 1.15–1.40)
Chloride: 107 mmol/L (ref 98–111)
Creatinine, Ser: 0.7 mg/dL (ref 0.44–1.00)
Glucose, Bld: 161 mg/dL — ABNORMAL HIGH (ref 70–99)
HCT: 47 % — ABNORMAL HIGH (ref 36.0–46.0)
Hemoglobin: 16 g/dL — ABNORMAL HIGH (ref 12.0–15.0)
Potassium: 3.4 mmol/L — ABNORMAL LOW (ref 3.5–5.1)
Sodium: 136 mmol/L (ref 135–145)
TCO2: 19 mmol/L — ABNORMAL LOW (ref 22–32)

## 2023-08-04 LAB — PROTIME-INR
INR: 1.6 — ABNORMAL HIGH (ref 0.8–1.2)
Prothrombin Time: 19.3 seconds — ABNORMAL HIGH (ref 11.4–15.2)

## 2023-08-04 LAB — MAGNESIUM: Magnesium: 1.9 mg/dL (ref 1.7–2.4)

## 2023-08-04 LAB — ETHANOL: Alcohol, Ethyl (B): <10 mg/dL (ref <10)

## 2023-08-04 LAB — TROPONIN I (HIGH SENSITIVITY): Troponin I (High Sensitivity): 16 ng/L (ref <18)

## 2023-08-04 LAB — SAMPLE TO BLOOD BANK

## 2023-08-04 MED ORDER — TETANUS-DIPHTH-ACELL PERTUSSIS 5-2.5-18.5 LF-MCG/0.5 IM SUSY
0.5000 mL | PREFILLED_SYRINGE | Freq: Once | INTRAMUSCULAR | Status: AC
  Administered 2023-08-04: 0.5 mL via INTRAMUSCULAR
  Filled 2023-08-04: qty 0.5

## 2023-08-04 MED ORDER — ONDANSETRON HCL 4 MG/2ML IJ SOLN
4.0000 mg | Freq: Once | INTRAMUSCULAR | Status: AC
  Administered 2023-08-04: 4 mg via INTRAVENOUS
  Filled 2023-08-04: qty 2

## 2023-08-04 MED ORDER — LACTATED RINGERS IV BOLUS
1000.0000 mL | Freq: Once | INTRAVENOUS | Status: AC
  Administered 2023-08-04: 1000 mL via INTRAVENOUS

## 2023-08-04 MED ORDER — OXYCODONE HCL 5 MG PO TABS
5.0000 mg | ORAL_TABLET | Freq: Once | ORAL | Status: AC
  Administered 2023-08-04: 5 mg via ORAL
  Filled 2023-08-04: qty 1

## 2023-08-04 MED ORDER — OXYCODONE HCL 5 MG/5ML PO SOLN: 2.5000 mg | ORAL | 0 refills | Status: AC | PRN

## 2023-08-04 NOTE — ED Triage Notes (Signed)
Pt had unwitnessed fall from standing. When EMS arrived pt right leg was internally rotated and there was deformity. Pt A&OX4, denied hitting head but is on xarelto. Received fentanyl en route

## 2023-08-04 NOTE — Discharge Instructions (Addendum)
As we discussed, the Hospice nurse will help set up PT eval and treat, my order is placed. We will have a case manager contact you later today to ensure there are no hang-ups.   If you are needing to use the pain medicine, make sure you are giving her something so she doesn't get constipated.

## 2023-08-04 NOTE — Progress Notes (Signed)
   08/04/23 0300  Spiritual Encounters  Type of Visit Initial  Care provided to: Patient  Referral source Trauma page  Reason for visit Trauma  OnCall Visit Yes   Ch responded to trauma level II page. There was no family at bedside. They are en route to [facility]. Ch provided support to pt and staff. No follow-up needed at this time.    Chaplain Carney Harder, M.Div,

## 2023-08-04 NOTE — Care Management (Addendum)
Transition of Care [facility]) - Emergency Department Mini Assessment   Patient Details  Name: [name] MRN: [id number] Date of Birth: 12-Apr-1928  [facility]) CM/SW Contact:    Lockie Pares, RN Phone Number: 08/04/2023, 7:55 AM   Clinical Narrative: Patient had a fall at home. She is with [facility]. Want to add PT to her home care. Orders placed Messaged Jen Love from [facility]. 0920 eft another message for [facility] and updated Nursing 1015 Spoke with Vangie [facility]. Either have hospice or HH but cannot have both. Spoke to Daughter Ms Fara Olden to discuss options. Messaged Jen  to call and speak to Ms Genevie Ann. The patient is currently immobile and they are not sure if they can manage her at home.  1500 spoke with Daughter Ms Clayborne Dana. She has heard from [facility]. They will come see them tonight or first thing tomorrow. She is being transported home by EMS and they are awaiting transport. ED Mini Assessment: What brought you to the Emergency Department? : fall  Barriers to Discharge: Continued Medical Work up     Means of departure: Car  Interventions which prevented an admission or readmission: Hospice    Patient Contact and Communications        ,                 Admission diagnosis:  Level 2 Fall Patient Active Problem List   Diagnosis Date Noted   Protein calorie malnutrition (HCC) 01/24/2023   Elevated LFTs 01/24/2023   Cerebral atrophy (HCC) 10/18/2021   Constipation 10/18/2021   Bee allergy status 04/18/2019   Hyponatremia 08/30/2018   Routine health maintenance 12/01/2011   Hx of completed stroke 03/09/2011   Atrial fibrillation (HCC) 03/09/2011   Hyperlipidemia 10/09/2008   Essential hypertension 09/24/2007   PCP:  Pcp, No Pharmacy:   [facility], [place] - [address] [place] [address] [place] [address] Phone: [phone] Fax: [phone]

## 2023-08-04 NOTE — ED Provider Notes (Signed)
[place] EMERGENCY DEPARTMENT AT [facility] Provider Note   CSN: [id number] Arrival date & time: 08/04/23  0254     History Chief Complaint  Patient presents with   [name] is a 89 y.o. female.  89 yo F here with ground level fall and Right leg rotation. Also some skin tears. Unsure if she hit her head but has no pain there. She doesn't remember the fall. Reportedly only on xarelto but her medication list hyas multiple other Rx meds, will consult with family when they arrive.   Patient had fentanyl with EMS. Has no pain at this time or other complaints but I suspect it is from the meds.   After fentanyl wore off patient having some pain in her right hip/pelvis area.         Home Medications Prior to Admission medications   Medication Sig Start Date End Date Taking? Authorizing Provider  oxyCODONE (ROXICODONE) 5 MG/5ML solution Take 2.5-5 mLs (2.5-5 mg total) by mouth every 4 (four) hours as needed for breakthrough pain. 08/04/23  Yes Kenlei Safi, Barbara Cower, MD  acetaminophen (TYLENOL) 325 MG tablet Take 650 mg by mouth every 6 (six) hours as needed.    [provider]  amiodarone (PACERONE) 200 MG tablet Take 1 tablet (200 mg total) by mouth daily. 06/01/22   Sheilah Pigeon, PA-C  amLODipine (NORVASC) 10 MG tablet Take 1 tablet (10 mg total) by mouth daily. Please call and schedule visit for Jan 2023 11/22/22   Myrlene Broker, MD  ciclopirox [facility]) 8 % solution Apply topically at bedtime. Apply over nail and surrounding skin. Apply daily over previous coat. After seven (7) days, may remove with alcohol and continue cycle. 09/07/21   Louann Sjogren, DPM  diphenhydrAMINE (SOMINEX) 25 MG tablet Take 25 mg by mouth daily as needed for allergies (bee sting).    [provider]  EPINEPHrine 0.3 mg/0.3 mL IJ SOAJ injection Inject 0.3 mg into the muscle as needed for anaphylaxis. 05/31/21   Myrlene Broker, MD  famotidine (PEPCID) 40  MG tablet 40 mg daily as needed. Bee sting    [provider]  Multiple Vitamin (MULTIVITAMIN WITH MINERALS) TABS tablet Take 1 tablet by mouth 2 (two) times daily.    [provider]  Multiple Vitamins-Minerals (VISION FORMULA/LUTEIN PO) Take 1 tablet by mouth daily.    [provider]  nitroGLYCERIN (NITROSTAT) 0.4 MG SL tablet Place 1 tablet (0.4 mg total) under the tongue every 5 (five) minutes as needed for chest pain (MAX 3 TABLETS). 03/30/17   Sheilah Pigeon, PA-C  polyethylene glycol [facility]) packet Take 17 g by mouth daily as needed (constipation).    [provider]  XARELTO 15 MG TABS tablet Take 1 tablet by mouth daily 05/14/23   Myrlene Broker, MD      Allergies    Codeine    Review of Systems   Review of Systems  Physical Exam Updated Vital Signs BP (!) 154/87   Pulse 94   Temp (!) 97.3 F (36.3 C) (Oral)   Resp 14   Ht 5\' 1"  (1.549 m)   Wt 43 kg   SpO2 97%   BMI 17.91 kg/m  Physical Exam Vitals and nursing note reviewed.  Constitutional:      Appearance: She is well-developed.  HENT:     Head: Normocephalic and atraumatic.  Eyes:     Pupils: Pupils are equal, round, and  reactive to light.  Cardiovascular:     Rate and Rhythm: Normal rate and regular rhythm.  Pulmonary:     Effort: No respiratory distress.     Breath sounds: No stridor.  Abdominal:     General: There is no distension.  Musculoskeletal:     Cervical back: Normal range of motion.     Comments: I range her right hip without obvious pain. Seems to be the correct length. NVI distally  Skin:    General: Skin is warm and dry.  Neurological:     Mental Status: She is alert.     ED Results / Procedures / Treatments   Labs (all labs ordered are listed, but only abnormal results are displayed) Labs Reviewed  COMPREHENSIVE METABOLIC PANEL - Abnormal; Notable for the following components:      Result Value   Potassium 3.4 (*)    CO2 15  (*)    Glucose, Bld 160 (*)    Anion gap 20 (*)    All other components within normal limits  CBC - Abnormal; Notable for the following components:   WBC 18.6 (*)    RBC 5.53 (*)    Hemoglobin 15.8 (*)    HCT 48.8 (*)    All other components within normal limits  PROTIME-INR - Abnormal; Notable for the following components:   Prothrombin Time 19.3 (*)    INR 1.6 (*)    All other components within normal limits  I-STAT CHEM 8, ED - Abnormal; Notable for the following components:   Potassium 3.4 (*)    Glucose, Bld 161 (*)    Calcium, Ion 1.02 (*)    TCO2 19 (*)    Hemoglobin 16.0 (*)    HCT 47.0 (*)    All other components within normal limits  ETHANOL  MAGNESIUM  URINALYSIS, ROUTINE W REFLEX MICROSCOPIC  SAMPLE TO BLOOD BANK  TROPONIN I (HIGH SENSITIVITY)    EKG None  Radiology CT PELVIS WO CONTRAST  Result Date: 08/04/2023 CLINICAL DATA:  Hip trauma, fracture suspected, xray done Right hip pain.  Leg is internally rotated. EXAM: CT PELVIS WITHOUT CONTRAST TECHNIQUE: Multidetector CT imaging of the pelvis was performed following the standard protocol without intravenous contrast. RADIATION DOSE REDUCTION: This exam was performed according to the departmental dose-optimization program which includes automated exposure control, adjustment of the mA and/or kV according to patient size and/or use of iterative reconstruction technique. COMPARISON:  Radiograph earlier today FINDINGS: Urinary Tract:  Unremarkable urinary bladder. Bowel:  No obstruction or inflammation of pelvic bowel loops. Vascular/Lymphatic: Aortic and branch atherosclerosis. No pelvic adenopathy. Reproductive:  No mass or other significant abnormality Other:  No pelvic free fluid. Musculoskeletal: The bones are diffusely under mineralized. Advanced osteoporosis. Nondisplaced fracture of the right inferior pubic ramus. Suspected nondisplaced fracture of the right puboacetabular junction, without fracture extension to  the acetabular component of hip arthroplasty. Right hip arthroplasty in expected alignment. No periprosthetic fracture. Nondisplaced right sacral fracture extending to the sacroiliac joint. No intramuscular hematoma. IMPRESSION: 1. Nondisplaced fracture of the right inferior pubic ramus. Suspected nondisplaced fracture of the right puboacetabular junction, without fracture extension to the acetabular component of hip arthroplasty. 2. Nondisplaced right sacral fracture extending to the sacroiliac joint. 3. Advanced osteoporosis. Aortic Atherosclerosis (ICD10-I70.0). Electronically Signed   By: Narda Rutherford M.D.   On: 08/04/2023 03:45   CT CERVICAL SPINE WO CONTRAST  Result Date: 08/04/2023 CLINICAL DATA:  Unwitnessed fall, neck pain EXAM: CT CERVICAL SPINE WITHOUT CONTRAST TECHNIQUE:  Multidetector CT imaging of the cervical spine was performed without intravenous contrast. Multiplanar CT image reconstructions were also generated. RADIATION DOSE REDUCTION: This exam was performed according to the departmental dose-optimization program which includes automated exposure control, adjustment of the mA and/or kV according to patient size and/or use of iterative reconstruction technique. COMPARISON:  None Available. FINDINGS: Alignment: Slight retrolisthesis of C5 on C4 and C6. This is related to facet disease. Skull base and vertebrae: No acute fracture. No primary bone lesion or focal pathologic process. Soft tissues and spinal canal: No prevertebral fluid or swelling. No visible canal hematoma. Disc levels: Mild degenerative disc disease with disc space narrowing and spurring. Mild degenerative facet disease bilaterally. Upper chest: No acute findings Other: None IMPRESSION: No acute bony abnormality. Electronically Signed   By: Charlett Nose M.D.   On: 08/04/2023 03:39   CT HEAD WO CONTRAST  Result Date: 08/04/2023 CLINICAL DATA:  Fall with neck pain EXAM: CT HEAD WITHOUT CONTRAST TECHNIQUE: Contiguous axial  images were obtained from the base of the skull through the vertex without intravenous contrast. RADIATION DOSE REDUCTION: This exam was performed according to the departmental dose-optimization program which includes automated exposure control, adjustment of the mA and/or kV according to patient size and/or use of iterative reconstruction technique. COMPARISON:  10/07/2021 FINDINGS: Brain: Chronic left MCA distribution infarct with encephalomalacia, stable. Advanced chronic small vessel disease and diffuse cerebral atrophy. No acute intracranial abnormality. Specifically, no hemorrhage, hydrocephalus, mass lesion, acute infarction, or significant intracranial injury. Vascular: No hyperdense vessel or unexpected calcification. Skull: No acute calvarial abnormality. Sinuses/Orbits: No acute findings Other: None IMPRESSION: Old left MCA infarct. Atrophy, chronic microvascular disease. No acute intracranial abnormality. Electronically Signed   By: Charlett Nose M.D.   On: 08/04/2023 03:38   DG Pelvis Portable  Result Date: 08/04/2023 CLINICAL DATA:  Ground level fall, right hip pain. EXAM: PORTABLE PELVIS 1-2 VIEWS COMPARISON:  Pelvis radiograph 08/31/2018 FINDINGS: Right hip arthroplasty in expected alignment. No evidence of periprosthetic lucency or fracture. Pubic rami are intact. Question of nondisplaced right sacral fracture. The bones are subjectively under mineralized. IMPRESSION: 1. Question of nondisplaced right sacral fracture. 2. Right hip arthroplasty without periprosthetic fracture. Electronically Signed   By: Narda Rutherford M.D.   On: 08/04/2023 03:14    Procedures Procedures    Medications Ordered in ED Medications  Tdap (BOOSTRIX) injection 0.5 mL (0.5 mLs Intramuscular Given 08/04/23 0418)  ondansetron (ZOFRAN) injection 4 mg (4 mg Intravenous Given 08/04/23 0328)  lactated ringers bolus 1,000 mL (0 mLs Intravenous Stopped 08/04/23 0550)  oxyCODONE (Oxy IR/ROXICODONE) immediate release  tablet 5 mg (5 mg Oral Given 08/04/23 0550)    ED Course/ Medical Decision Making/ A&P                                 Medical Decision Making Amount and/or Complexity of Data Reviewed Labs: ordered. Radiology: ordered. ECG/medicine tests: ordered.  Risk Prescription drug management.   Xr without obvious fracture on my interpretation, will ct pelvis.   Ct head/neck from trauma on xarelto that she doesn't recall. No obvious trauma to the head.  Skin tears - wound care per nursing.   Ct pelvis with some fractures in right side of pelvis. Spoke with Dr. Dion Saucier and these are not surgical at this time and suggest pain control with PT.   After further discussions with the daughters (share caretaker responsibilities) and granddaughter, the patient  is on hospice. They state they have quite a few implements at home already to include bedside commode, railings, shower chair, walker, wheelchair. They have an aide that helps and an RN that comes out weekly and feel they could manage this at home esp if their is PT involved. They do state she gets quite constipated at baseline so will need to increase her stool softener use. I spoke with [facility] who stated they could help with the PT side of things if there is an order in place and I fax them my note which I will do after finishing. I will likely put in a consult for TOC to contact daughters later in the morning to ensure that they don't need anything else from the hospital. Will give Rx for oxycodone to use as needed but tylenol as much as she will tolerate.    Final Clinical Impression(s) / ED Diagnoses Final diagnoses:  Fall, initial encounter  Closed fracture of sacrum, unspecified portion of sacrum, initial encounter (HCC)  Closed fracture of right inferior pubic ramus, initial encounter [facility])    Rx / DC Orders ED Discharge Orders          Ordered    Home Health        08/04/23 [phone]    Face-to-face encounter (required for  Medicare/Medicaid patients)       Comments: I Marily Memos certify that this patient is under my care and that I, or a nurse practitioner or physician's assistant working with me, had a face-to-face encounter that meets the physician face-to-face encounter requirements with this patient on 08/04/2023. The encounter with the patient was in whole, or in part for the following medical condition(s) which is the primary reason for home health care (List medical condition): new pelvic fractures, non-operative. Further orders per PCP   08/04/23 0524    oxyCODONE (ROXICODONE) 5 MG/5ML solution  Every 4 hours PRN        08/04/23 0555    PT eval and treat        08/04/23 0557              Gavrielle Streck, Barbara Cower, MD 08/04/23 [phone]

## 2023-08-17 ENCOUNTER — Telehealth: Payer: Self-pay

## 2023-08-17 NOTE — Telephone Encounter (Signed)
Transition Care Management Unsuccessful Follow-up Telephone Call  Date of discharge and from where:  [facility] 8/31  Attempts:  1st Attempt  Reason for unsuccessful TCM follow-up call:  No answer/busy   Lenard Forth [place]  [facility], [facility] Guide, Phone: [phone] Website: [email or web address]

## 2023-08-20 ENCOUNTER — Telehealth: Payer: Self-pay

## 2023-08-20 NOTE — Telephone Encounter (Signed)
Transition Care Management Unsuccessful Follow-up Telephone Call  Date of discharge and from where:  [facility]  Attempts:  2nd Attempt  Reason for unsuccessful TCM follow-up call:  No answer/busy   Lenard Forth [place]  [facility], [facility] Guide, Phone: [phone] Website: [email or web address]

## 2023-12-05 DEATH — deceased
